# Patient Record
Sex: Male | Born: 1960
Health system: Southern US, Community
[De-identification: ages and names within clinical notes are randomized; demographics above are authoritative.]

## PROBLEM LIST (undated history)

## (undated) DIAGNOSIS — I639 Cerebral infarction, unspecified: Secondary | ICD-10-CM

## (undated) DIAGNOSIS — K219 Gastro-esophageal reflux disease without esophagitis: Secondary | ICD-10-CM

## (undated) DIAGNOSIS — I7101 Dissection of thoracic aorta: Secondary | ICD-10-CM

## (undated) DIAGNOSIS — J302 Other seasonal allergic rhinitis: Secondary | ICD-10-CM

## (undated) DIAGNOSIS — J449 Chronic obstructive pulmonary disease, unspecified: Secondary | ICD-10-CM

## (undated) DIAGNOSIS — I5042 Chronic combined systolic (congestive) and diastolic (congestive) heart failure: Secondary | ICD-10-CM

## (undated) DIAGNOSIS — I2119 ST elevation (STEMI) myocardial infarction involving other coronary artery of inferior wall: Secondary | ICD-10-CM

## (undated) DIAGNOSIS — I10 Essential (primary) hypertension: Secondary | ICD-10-CM

## (undated) DIAGNOSIS — F419 Anxiety disorder, unspecified: Secondary | ICD-10-CM

## (undated) DIAGNOSIS — I255 Ischemic cardiomyopathy: Secondary | ICD-10-CM

## (undated) DIAGNOSIS — E785 Hyperlipidemia, unspecified: Secondary | ICD-10-CM

## (undated) HISTORY — DX: Ischemic cardiomyopathy: I25.5

## (undated) HISTORY — DX: Chronic obstructive pulmonary disease, unspecified: J44.9

## (undated) HISTORY — DX: ST elevation (STEMI) myocardial infarction involving other coronary artery of inferior wall: I21.19

## (undated) HISTORY — DX: Gastro-esophageal reflux disease without esophagitis: K21.9

## (undated) HISTORY — DX: Hyperlipidemia, unspecified: E78.5

## (undated) HISTORY — DX: Anxiety disorder, unspecified: F41.9

## (undated) HISTORY — PX: OTHER SURGICAL HISTORY: SHX169

## (undated) HISTORY — DX: Dissection of thoracic aorta: I71.01

## (undated) HISTORY — DX: Cerebral infarction, unspecified: I63.9

## (undated) HISTORY — DX: Chronic combined systolic (congestive) and diastolic (congestive) heart failure: I50.42

## (undated) HISTORY — DX: Other seasonal allergic rhinitis: J30.2

---

## 1998-10-07 ENCOUNTER — Emergency Department (HOSPITAL_COMMUNITY): Admission: EM | Admit: 1998-10-07 | Discharge: 1998-10-07 | Payer: Self-pay | Admitting: Emergency Medicine

## 1999-01-05 ENCOUNTER — Encounter: Payer: Self-pay | Admitting: Emergency Medicine

## 1999-01-05 ENCOUNTER — Emergency Department (HOSPITAL_COMMUNITY): Admission: EM | Admit: 1999-01-05 | Discharge: 1999-01-05 | Payer: Self-pay | Admitting: Emergency Medicine

## 1999-02-09 ENCOUNTER — Ambulatory Visit (HOSPITAL_COMMUNITY): Admission: RE | Admit: 1999-02-09 | Discharge: 1999-02-09 | Payer: Self-pay | Admitting: Gastroenterology

## 1999-02-09 ENCOUNTER — Encounter: Payer: Self-pay | Admitting: Gastroenterology

## 2009-04-12 ENCOUNTER — Emergency Department (HOSPITAL_COMMUNITY): Admission: EM | Admit: 2009-04-12 | Discharge: 2009-04-12 | Payer: Self-pay | Admitting: Emergency Medicine

## 2010-12-18 LAB — POCT I-STAT, CHEM 8
Calcium, Ion: 1.15 mmol/L (ref 1.12–1.32)
Glucose, Bld: 103 mg/dL — ABNORMAL HIGH (ref 70–99)
HCT: 46 % (ref 39.0–52.0)
Hemoglobin: 15.6 g/dL (ref 13.0–17.0)

## 2010-12-18 LAB — URINALYSIS, ROUTINE W REFLEX MICROSCOPIC
Bilirubin Urine: NEGATIVE
Glucose, UA: NEGATIVE mg/dL
Ketones, ur: NEGATIVE mg/dL
Leukocytes, UA: NEGATIVE
Nitrite: NEGATIVE
Protein, ur: NEGATIVE mg/dL
Specific Gravity, Urine: 1.019 (ref 1.005–1.030)
Urobilinogen, UA: 0.2 mg/dL (ref 0.0–1.0)
pH: 5.5 (ref 5.0–8.0)

## 2010-12-18 LAB — URINE MICROSCOPIC-ADD ON

## 2014-12-24 ENCOUNTER — Emergency Department (HOSPITAL_COMMUNITY)
Admission: EM | Admit: 2014-12-24 | Discharge: 2014-12-24 | Disposition: A | Discharge status: Home / Self Care | Payer: Self-pay | Attending: Emergency Medicine | Admitting: Emergency Medicine

## 2014-12-24 ENCOUNTER — Encounter (HOSPITAL_COMMUNITY): Payer: Self-pay | Admitting: Emergency Medicine

## 2014-12-24 ENCOUNTER — Emergency Department (HOSPITAL_COMMUNITY): Payer: Self-pay

## 2014-12-24 DIAGNOSIS — M25559 Pain in unspecified hip: Secondary | ICD-10-CM

## 2014-12-24 DIAGNOSIS — Z720 Tobacco use: Secondary | ICD-10-CM | POA: Insufficient documentation

## 2014-12-24 DIAGNOSIS — G8929 Other chronic pain: Secondary | ICD-10-CM | POA: Insufficient documentation

## 2014-12-24 DIAGNOSIS — L03115 Cellulitis of right lower limb: Secondary | ICD-10-CM | POA: Insufficient documentation

## 2014-12-24 DIAGNOSIS — M25551 Pain in right hip: Secondary | ICD-10-CM | POA: Insufficient documentation

## 2014-12-24 LAB — CBG MONITORING, ED: Glucose-Capillary: 102 mg/dL — ABNORMAL HIGH (ref 70–99)

## 2014-12-24 MED ORDER — HYDROCODONE-ACETAMINOPHEN 5-325 MG PO TABS: 1.0000 | ORAL_TABLET | Freq: Four times a day (QID) | ORAL | Status: DC | PRN

## 2014-12-24 MED ORDER — SULFAMETHOXAZOLE-TRIMETHOPRIM 800-160 MG PO TABS: 1.0000 | ORAL_TABLET | Freq: Two times a day (BID) | ORAL | Status: AC

## 2014-12-24 MED ORDER — CEPHALEXIN 500 MG PO CAPS: 500.0000 mg | ORAL_CAPSULE | Freq: Four times a day (QID) | ORAL | Status: DC

## 2014-12-24 NOTE — ED Provider Notes (Signed)
CSN: [id number]     Arrival date & time 12/24/14  1028 History  This chart was scribed for non-physician practitioner, Frank Mutton, [name], working with Frank Canal, [name], by [name], [name]. This patient was seen in room TR09C/TR09C and the patient's care was started at 10:57 AM.    Chief Complaint  Patient presents with  . Foot Pain  . Hip Pain    The history is provided by the patient. No language interpreter was used.   HPI Comments: [name] is a 54 y.o. male who with no known signficant past medical history presenting to the [facility] with right hip pain and right foot pain. Patient reported that he's been dealing with the right hip pain for proximally 6 months, but states it is gone progressively worse. When asked the patient is followed by anyone regarding this pain, patient denies. Stated the pain comes and goes describes a sharp shooting pain that radiates down the back of his thigh. Stated he's been using nothing for the discomfort. 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 Patient reported that this morning he woke up with right foot pain-reported that he noticed swelling, redness and warmth upon palpation to the top of his right foot. Stated that at rest the pain is an underlying throbbing discomfort, but states with motion there is a pressure sensation. Patient reported that he thinks he got bit by something. Stated that the pain stays within his right foot. Not history of gout or diabetes. Denied itching, drainage, injury, trauma, fall, knee pain, numbness, loss of sensation, leg swelling, travels, back pain, neck pain, urinary and bowel incontinence, Donald pain, nausea, vomiting. PCP none  History reviewed. No pertinent past medical history. Past Surgical History  Procedure Laterality Date  . Rectal abscess     No family history on file. History  Substance Use Topics  . Smoking status: Current Some Day Smoker     Types: Cigarettes  . Smokeless tobacco: Not on file  . Alcohol Use: Yes     Comment: occasionally    Review of Systems  Constitutional: Negative for fever.  Cardiovascular: Negative for leg swelling.  Gastrointestinal: Negative for nausea, vomiting and diarrhea.  Musculoskeletal: Positive for arthralgias (right hip, right foot). Negative for back pain and neck pain.  Skin: Positive for color change (redness).  Neurological: Negative for weakness and numbness.      Allergies  Review of patient's allergies indicates no known allergies.  Home Medications   Prior to Admission medications   Medication Sig Start Date End Date Taking? Authorizing Provider  cephALEXin (KEFLEX) 500 MG capsule Take 1 capsule (500 mg total) by mouth 4 (four) times daily. 12/24/14   Frank Siemon, [name]  HYDROcodone-acetaminophen (NORCO/VICODIN) 5-325 MG per tablet Take 1 tablet by mouth every 6 (six) hours as needed for severe pain. 12/24/14   Frank Grisby, [name]  sulfamethoxazole-trimethoprim (BACTRIM DS,SEPTRA DS) 800-160 MG per tablet Take 1 tablet by mouth 2 (two) times daily. 12/24/14 12/31/14  Frank Michelle, [name]   BP 152/101 mmHg  Pulse 90  Temp(Src) 98.5 F (36.9 C) (Oral)  Resp 18  Ht  (1.702 m)  Wt 250 lb (113.399 kg)  BMI 39.15 kg/m2  SpO2 100% Physical Exam  Constitutional: He is oriented to person, place, and time. He appears well-developed and well-nourished. No distress.  HENT:  Head: Normocephalic and atraumatic.  Eyes: Conjunctivae and EOM are normal. Right eye exhibits no  discharge. Left eye exhibits no discharge.  Neck: Normal range of motion. Neck supple.  Cardiovascular: Normal rate, regular rhythm and normal heart sounds.  Exam reveals no friction rub.   No murmur heard. Pulses:      Radial pulses are 2+ on the right side, and 2+ on the left side.       Dorsalis pedis pulses are 2+ on the right side, and 2+ on the left side.  Pulmonary/Chest: Effort normal and breath  sounds normal. No respiratory distress. He has no wheezes. He has no rales.  Musculoskeletal: Normal range of motion. He exhibits tenderness. He exhibits no edema.       Right hip: He exhibits tenderness. He exhibits normal range of motion, normal strength, no bony tenderness, no swelling, no crepitus and no deformity.       Right ankle: He exhibits swelling. He exhibits normal range of motion, no ecchymosis, no deformity and no laceration. Tenderness (dorsal aspect ).       Legs:      Feet:  Negative swelling, erythema, formation, lesions, sores, deformities identified to the right hip. Full range of motion identified. Mild discomfort upon palpation to the right acetabulum.  Neurological: He is alert and oriented to person, place, and time. No cranial nerve deficit. He exhibits normal muscle tone. Coordination normal.  Cranial nerves III-XII grossly intact Strength 5+/5+ to lower extremities bilaterally with resistance applied, equal distribution noted Sensation intact with differentiation to sharp and dull touch to lower extremity, right Gait proper, proper balance - negative sway, negative drift, negative step-offs  Skin: Skin is warm and dry. No rash noted. He is not diaphoretic. There is erythema.  Approximate 5 cm x 5 cm circular area of erythema and swelling identified to the dorsal aspect of the right foot with tenderness upon palpation. Negative area of fluctuance or induration noted. Negative active drainage or bleeding noted. Negative red streaks. Doubt herpes zoster. Suspicion to be beginnings of cellulitis.  Psychiatric: He has a normal mood and affect. His behavior is normal. Thought content normal.  Nursing note and vitals reviewed.   [facility] Course  Procedures (including critical care time) DIAGNOSTIC STUDIES: Oxygen Saturation is 99% on room air, normal by my interpretation.    COORDINATION OF CARE: 11:06 AM Discussed treatment plan with patient at beside, the patient agrees with  the plan and has no further questions at this time.   Labs Review Labs Reviewed  CBG MONITORING, [facility] - Abnormal; Notable for the following:    Glucose-Capillary 102 (*)    All other components within normal limits    Imaging Review Dg Foot Complete Right  12/24/2014   CLINICAL DATA:  Pain and redness across metatarsals. No history of recent trauma  EXAM: RIGHT FOOT COMPLETE - 3+ VIEW  COMPARISON:  None.  FINDINGS: Frontal, oblique, and lateral views obtained. No fracture or dislocation. Joint spaces appear intact. No erosive change.  IMPRESSION: No fracture or dislocation.  No appreciable arthropathic change.   Electronically Signed   By: Bretta Bang III M.D.   On: 12/24/2014 12:08   Dg Hip Unilat With Pelvis 2-3 Views Right  12/24/2014   CLINICAL DATA:  Worsening Pain right hip for 6 months.no injury POSTERIOR AND LATERAL RIGHT HIP PAIN  EXAM: RIGHT HIP (WITH PELVIS) 2-3 VIEWS  COMPARISON:  None.  FINDINGS: There are advanced arthropathic changes of the right hip with marked superior lateral joint space narrowing prominent superior acetabular and right femoral head subchondral cystic change  and sclerosis as well as osteophytes from the base of the right femoral head and from the lateral margin of the superior acetabulum. There is no acute fracture.  Left hip joint is normally spaced and aligned with no significant arthropathic change. SI joints and symphysis pubis are normally spaced and aligned.  Soft tissues are unremarkable.  IMPRESSION: 1. No acute fracture or dislocation. 2. Advanced arthropathic changes of the right hip, not evident on the left. Findings are consistent with osteoarthritis, which may be secondary to remote trauma or chronic avascular necrosis.   Electronically Signed   By: Amie Portlandavid  Ormond M.D.   On: 12/24/2014 11:55     EKG Interpretation None      12:27 PM Discussed findings with Dr. Cheri Rous. Yao, agreed to discharge patient.   MDM   Final diagnoses:  Hip pain   Chronic hip pain, right  Cellulitis of right lower extremity    Medications - No data to display  Filed Vitals:   12/24/14 1041 12/24/14 1228  BP: 150/98 152/101  Pulse: 101 90  Temp: 98.5 F (36.9 C)   TempSrc: Oral   Resp: 18 18  Height: 5\' 7"  (1.702 m)   Weight: 250 lb (113.399 kg)   SpO2: 99% 100%   I personally performed the services described in this documentation, which was scribed in my presence. The recorded information has been reviewed and is accurate.  CBG 102. Unilateral plain film of right hip no acute fracture dislocation-advanced arthropathic changes of the right hip consistent with osteoarthritis which may be secondary to remote trauma or chronic avascular necrosis. Plain film of right foot no fracture dislocation. No appreciable arthropathic changes noted. Doubt diabetes. Suspicion of swelling to the right foot identified to be cellulitic infection, doubt gout. Right hip pain has been chronic for approximately 6 months, most likely to be osteoarthritis. Negative focal neurological deficits noted. Pulses palpable and strong. Cap refill less than 3 seconds. Patient stable, afebrile. Patient not septic appearing. Discharged patient. Discharge patient with antibiotics for cellulitis. Discharge patient with small dose of pain medications-discussed course, cautions, disposal technique. Line of demarcation made on foot-discussed with patient that his swelling or redness goes past the line he needs to report back to the [facility] immediately. [facility]. Discussed with patient to closely monitor symptoms and if symptoms are to worsen or change to report back to the [facility] - strict return instructions given.  Patient agreed to plan of care, understood, all questions answered.   Frank MuttonMarissa Tykesha Konicki, [name] 12/24/14 1238  Frank Canalavid H Yao, [name] 12/24/14 [phone]

## 2014-12-24 NOTE — ED Notes (Signed)
C/o right foot pain since last pm. Dorsal surface of foot red and swollen. ALSO, c/o right hip pain x 6 months. No PCP.

## 2014-12-24 NOTE — Discharge Instructions (Signed)
Please call your doctor for a followup appointment within 24-48 hours. When you talk to your doctor please let them know that you were seen in the emergency department and have them acquire all of your records so that they can discuss the findings with you and formulate a treatment plan to fully care for your new and ongoing problems. Please follow-up with health and wellness Center Please rest and stay hydrated Please rest, ice, elevate the right hip Please follow-up with orthopedics regarding chronic right hip pain Please avoid any physical strenuous activity Please take antibiotics as prescribed on a full stomach Please report back to the ED within 48 hours for site to be reassessed Please take medications as prescribed - while on pain medications there is to be no drinking alcohol, driving, operating any heavy machinery. If extra please dispose in a proper manner. Please do not take any extra Tylenol with this medication for this can lead to Tylenol overdose and liver issues.  Please continue to monitor symptoms closely and if symptoms are to worsen or change (fever greater than 101, chills, sweating, nausea, vomiting, chest pain, shortness of breathe, difficulty breathing, weakness, numbness, tingling, worsening or changes to pain pattern, fall, injury, loss of sensation, spreading of the redness, red streaks running up the leg, leg swelling) please report back to the Emergency Department immediately.   Hip Pain Your hip is the joint between your upper legs and your lower pelvis. The bones, cartilage, tendons, and muscles of your hip joint perform a lot of work each day supporting your body weight and allowing you to move around. Hip pain can range from a minor ache to severe pain in one or both of your hips. Pain may be felt on the inside of the hip joint near the groin, or the outside near the buttocks and upper thigh. You may have swelling or stiffness as well.  HOME CARE INSTRUCTIONS   Take  medicines only as directed by your health care provider.  Apply ice to the injured area:  Put ice in a plastic bag.  Place a towel between your skin and the bag.  Leave the ice on for 15-20 minutes at a time, 3-4 times a day.  Keep your leg raised (elevated) when possible to lessen swelling.  Avoid activities that cause pain.  Follow specific exercises as directed by your health care provider.  Sleep with a pillow between your legs on your most comfortable side.  Record how often you have hip pain, the location of the pain, and what it feels like. SEEK MEDICAL CARE IF:   You are unable to put weight on your leg.  Your hip is red or swollen or very tender to touch.  Your pain or swelling continues or worsens after 1 week.  You have increasing difficulty walking.  You have a fever. SEEK IMMEDIATE MEDICAL CARE IF:   You have fallen.  You have a sudden increase in pain and swelling in your hip. MAKE SURE YOU:   Understand these instructions.  Will watch your condition.  Will get help right away if you are not doing well or get worse. Document Released: 02/15/2010 Document Revised: 01/12/2014 Document Reviewed: 04/24/2013 [facility] Patient Information 2015 [place]. This information is not intended to replace advice given to you by your health care provider. Make sure you discuss any questions you have with your health care provider.  Cellulitis Cellulitis is an infection of the skin and the tissue beneath it. The infected area  is usually red and tender. Cellulitis occurs most often in the arms and lower legs.  CAUSES  Cellulitis is caused by bacteria that enter the skin through cracks or cuts in the skin. The most common types of bacteria that cause cellulitis are staphylococci and streptococci. SIGNS AND SYMPTOMS   Redness and warmth.  Swelling.  Tenderness or pain.  Fever. DIAGNOSIS  Your health care provider can usually determine what is wrong based on  a physical exam. Blood tests may also be done. TREATMENT  Treatment usually involves taking an antibiotic medicine. HOME CARE INSTRUCTIONS   Take your antibiotic medicine as directed by your health care provider. Finish the antibiotic even if you start to feel better.  Keep the infected arm or leg elevated to reduce swelling.  Apply a warm cloth to the affected area up to 4 times per day to relieve pain.  Take medicines only as directed by your health care provider.  Keep all follow-up visits as directed by your health care provider. SEEK MEDICAL CARE IF:   You notice red streaks coming from the infected area.  Your red area gets larger or turns dark in color.  Your bone or joint underneath the infected area becomes painful after the skin has healed.  Your infection returns in the same area or another area.  You notice a swollen bump in the infected area.  You develop new symptoms.  You have a fever. SEEK IMMEDIATE MEDICAL CARE IF:   You feel very sleepy.  You develop vomiting or diarrhea.  You have a general ill feeling (malaise) with muscle aches and pains. MAKE SURE YOU:   Understand these instructions.  Will watch your condition.  Will get help right away if you are not doing well or get worse. Document Released: 06/07/2005 Document Revised: 01/12/2014 Document Reviewed: 11/13/2011 [facility] Patient Information 2015 [place], [place]. This information is not intended to replace advice given to you by your health care provider. Make sure you discuss any questions you have with your health care provider.   Emergency Department Resource Guide 1) Find a Doctor and Pay Out of Pocket Although you won't have to find out who is covered by your insurance plan, it is a good idea to ask around and get recommendations. You will then need to call the office and see if the doctor you have chosen will accept you as a new patient and what types of options they offer for patients who  are self-pay. Some doctors offer discounts or will set up payment plans for their patients who do not have insurance, but you will need to ask so you aren't surprised when you get to your appointment.  2) Contact Your Local Health Department Not all health departments have doctors that can see patients for sick visits, but many do, so it is worth a call to see if yours does. If you don't know where your local health department is, you can check in your phone book. The CDC also has a tool to help you locate your state's health department, and many state websites also have listings of all of their local health departments.  3) Find a Walk-in Clinic If your illness is not likely to be very severe or complicated, you may want to try a walk in clinic. These are popping up all over the country in pharmacies, drugstores, and shopping centers. They're usually staffed by nurse practitioners or physician assistants that have been trained to treat common illnesses and complaints. They're usually  fairly quick and inexpensive. However, if you have serious medical issues or chronic medical problems, these are probably not your best option.  No Primary Care Doctor: - Call Health Connect at  [phone] - they can help you locate a primary care doctor that  accepts your insurance, provides certain services, etc. - Physician Referral Service- [phone]  Chronic Pain Problems: Organization         Address  Phone   Notes  [facility]  [phone] Patients need to be referred by their primary care doctor.   Medication Assistance: Organization         Address  Phone   Notes  [facility] Medication [facility] [address] [place], [place] [address] [phone] --Must be a resident of [facility] -- Must have NO insurance coverage whatsoever (no Medicaid/ Medicare, etc.) -- The pt. MUST have a primary care doctor that directs their care regularly and follows  them in the community   Med[facility]ist  [phone]   [employer]  [phone]    Agencies that provide inexpensive medical care: Organization         Address  Phone   Notes  [facility]  [phone]   [facility]    [phone]   [facility] [address] [place], [place] [address] [phone]   [facility] [address], [place] [phone]   Planned Parenthood    [phone]   [facility]    [phone]   [facility]  [address], [place] Phone:  [phone], Fax:  [phone] Hours of Operation:  9 am - 6 pm, M-F.  Also accepts Medicaid/Medicare and self-pay.  [facility]  [address], [place] Phone: [phone], Fax: [phone]. Hours of Operation:  8:30 am - 5:30 pm, M-F.  Also accepts Medicaid and self-pay.  [facility] [place] [address] [place] Phone: [phone]   [facility] [address] Natasha Bence [place], [place] [phone], Ext. [phone] Mondays & Thursdays: 7-9 AM.  First 15 patients are seen on a first come, first serve basis.    Medicaid-accepting [facility] Providers:  Organization         Address  Phone   Notes  [facility] [address], [place] [phone] Also accepts self-pay patients.  [facility] [address] Laurell Josephs [place], [place]  [phone]   [facility] [address], [place] [phone]   [facility] [address] [place] [phone]   Renaye Rakers [address], [place]   [phone] Only accepts [place] patients after they have their name applied to their card.   Self-Pay (no insurance) in [facility]:  Organization         Address  Phone   Notes  Sickle Cell  Patients, [facility] [address] [place], [place] [phone]   [facility] [address] [place], [place] [phone]   [facility] [place]  [address], [place] [phone]   [facility]/Dr. Osei-Bonsu  [address] [place] or [address], [place] [phone] Phone  number for both [place] and [place] locations is the same.  [facility] [address], [place] [phone]   [facility] [address], [place] or [address] [phone]   [facility] [address], [place] [phone], phone; [phone], fax Sees patients 1st and 3rd Saturday of every month.  Must not qualify for public or private insurance (i.e. Medicaid, Medicare, [place] Health Choice, Veterans' Benefits)  Household income should be no more than 200% of the poverty level The clinic cannot treat you if you are pregnant or think you are pregnant  Sexually transmitted diseases are not treated at the clinic.    Dental Care: Organization         Address  Phone  Notes  [facility] of [facility] [address] [place], [place] [phone] Accepts children up to age 45 who are enrolled in [place] or [place] Health Choice; pregnant women with a Medicaid card; and children who have applied for Medicaid or [place] Health Choice, but were declined, whose parents can pay a reduced fee at time of service.  [facility]  [address], [place] [phone] Accepts children up to age 65 who are enrolled in [place] or [place] Health Choice; pregnant women with a Medicaid card; and children who have applied for Medicaid or [place] Health Choice, but were declined, whose parents can pay a reduced fee at time of service.  [facility]  [address] [place], [place] [phone] Patients are seen by appointment only. Walk-ins are not accepted. [facility] will see patients 15 years of age and older. Monday - Tuesday (8am-5pm) Most Wednesdays (8:30-5pm) $30 per visit, cash only  [facility] Adult Dental Access PROGRAM  [address], [facility] [phone] Patients are seen by appointment only. Walk-ins are not accepted. [facility] will see patients 71 years of age and older. One Wednesday Evening (Monthly: Volunteer Based).  $30 per visit, cash only  [employer]  [phone] for adults; Children under age 95, call Graduate Pediatric Dentistry at [phone]. Children aged 18-14, please call [phone] to request a pediatric application.  Dental services are provided in all areas of dental care including fillings, crowns and bridges, complete and partial dentures, implants, gum treatment, root canals, and extractions. Preventive care is also provided. Treatment is provided to both adults and children. Patients are selected via a lottery and there is often a waiting list.   [facility] [address] [place]  [phone] [email or web address]   [facility] [address] [place], [place] [phone], Ext. [phone] Second and Fourth Thursday of each month, opens at 6:30 AM; Clinic ends at 9 AM.  Patients are seen on a first-come first-served basis, and a limited number are seen during each clinic.   [facility]  [address] Ether Griffins [place], [place] [phone]   Eligibility Requirements You must have lived in [place], [place], or [place] counties for at least the last three months.   You cannot be eligible for state or federal sponsored [place], including [employer], [place], or [employer].   You generally cannot be eligible for healthcare insurance through your employer.    How to apply: Eligibility  screenings are held every Tuesday and Wednesday afternoon from 1:00 pm  until 4:00 pm. You do not need an appointment for the interview!  [facility] [address], [place], [place] [phone]   [facility] Health Department  [phone]   [facility] Health Department  [phone]   [facility] Health Department  [phone]    Behavioral Health Resources in the Community: Intensive Outpatient Programs Organization         Address  Phone  Notes  [facility] [address], [place], [place] [phone]   [facility] [address] [place], [place] [phone]   [facility] [address], [place], [place]  [phone]   [facility] [address],  [place], [place] [phone] or [phone]   Substance Abuse Resources Organization         Address  Phone  Notes  [facility]  [phone]   [facility]  [phone]   [facility] [place]  [phone]   Floydene Flock  [phone]   Residential & Outpatient Substance Abuse Program  [phone]   Psychological Services Organization         Address  Phone  Notes  [facility] Behavioral Health  [phone]   [facility] Services  [phone]   [facility] [address], [place] [phone] or [phone]    Mobile Crisis Teams Organization         Address  Phone  Notes  [facility], Mobile Crisis Care Unit  [phone]   [facility]  [address] [place], [place] [phone]   Doristine Locks [address] [place] [phone]    Self-Help/Support Groups Organization         Address  Phone             Notes  [facility] [place] - variety of support groups  336- [phone] Call for more information  Narcotics Anonymous (NA), [facility] [address], [employer] [place]  2 meetings at  this location   Statistician         Address  Phone  Notes  [facility] [address],    [place]  [phone]   [facility]  [address] [place] [address], [place], [place] [phone]   [facility] [address] [place], [place] [phone] Admissions: 8am-3pm M-F  [facility] [address]    [place], [place] [phone]   [facility] [address] [place], [place], [place] [phone]   [facility] [address],  [place], [place] [phone]   [facility] - Intensive Outpatient [address] Laurell Josephs [address], [place], [place] [phone]   [facility] ([facility] [address] [place]  [place], [place] [phone] or [phone]   [facility] ([facility]) [address], [place], [place] [phone] Accepts Medicaid  [facility] [place] [address]  [place] [phone] Substance Abuse/Addiction Treatment   [facility] Organization         Address  Phone  Notes  [facility]  [phone]   Angie Fava, PhD [address] Ervin Knack [place], [place]   [phone] or [phone]   [facility] Behavioral   [address] [place], [place] [phone]   [facility] [address] [place], [place] [phone] Insurance/Medicaid/sponsorship through [employer] and Families [address], [place] [phone] Therapy/tele-psych/case  [facility] [address]   [place], [place] [phone]    Dr. Lolly Mustache  [phone]   [facility] [place]  [employer] Dept. 1) [address] [place] 2) [address] [place] 3)  [address], [place] [phone]  [phone]   [facility] Child Abuse Hotline [phone] or [phone] (After Hours)

## 2015-01-02 ENCOUNTER — Emergency Department (HOSPITAL_COMMUNITY): Payer: Self-pay

## 2015-01-02 ENCOUNTER — Inpatient Hospital Stay (HOSPITAL_COMMUNITY)
Admission: EM | Admit: 2015-01-02 | Discharge: 2015-01-07 | DRG: 299 | Disposition: A | Discharge status: Home / Self Care | Payer: Self-pay | Attending: Internal Medicine | Admitting: Internal Medicine

## 2015-01-02 ENCOUNTER — Encounter (HOSPITAL_COMMUNITY): Payer: Self-pay | Admitting: Emergency Medicine

## 2015-01-02 DIAGNOSIS — Z720 Tobacco use: Secondary | ICD-10-CM | POA: Diagnosis present

## 2015-01-02 DIAGNOSIS — K852 Alcohol induced acute pancreatitis without necrosis or infection: Secondary | ICD-10-CM | POA: Diagnosis present

## 2015-01-02 DIAGNOSIS — F1010 Alcohol abuse, uncomplicated: Secondary | ICD-10-CM | POA: Diagnosis present

## 2015-01-02 DIAGNOSIS — I7102 Dissection of abdominal aorta: Principal | ICD-10-CM | POA: Diagnosis present

## 2015-01-02 DIAGNOSIS — I7101 Dissection of thoracic aorta: Secondary | ICD-10-CM

## 2015-01-02 DIAGNOSIS — K859 Acute pancreatitis without necrosis or infection, unspecified: Secondary | ICD-10-CM | POA: Diagnosis present

## 2015-01-02 DIAGNOSIS — F1410 Cocaine abuse, uncomplicated: Secondary | ICD-10-CM | POA: Diagnosis present

## 2015-01-02 DIAGNOSIS — I71019 Dissection of thoracic aorta, unspecified: Secondary | ICD-10-CM | POA: Diagnosis present

## 2015-01-02 DIAGNOSIS — E669 Obesity, unspecified: Secondary | ICD-10-CM | POA: Diagnosis present

## 2015-01-02 DIAGNOSIS — Z833 Family history of diabetes mellitus: Secondary | ICD-10-CM

## 2015-01-02 DIAGNOSIS — K858 Other acute pancreatitis: Secondary | ICD-10-CM

## 2015-01-02 DIAGNOSIS — R0789 Other chest pain: Secondary | ICD-10-CM

## 2015-01-02 DIAGNOSIS — I714 Abdominal aortic aneurysm, without rupture: Secondary | ICD-10-CM | POA: Diagnosis present

## 2015-01-02 DIAGNOSIS — R9431 Abnormal electrocardiogram [ECG] [EKG]: Secondary | ICD-10-CM

## 2015-01-02 DIAGNOSIS — F17210 Nicotine dependence, cigarettes, uncomplicated: Secondary | ICD-10-CM | POA: Diagnosis present

## 2015-01-02 DIAGNOSIS — R739 Hyperglycemia, unspecified: Secondary | ICD-10-CM | POA: Diagnosis present

## 2015-01-02 DIAGNOSIS — I10 Essential (primary) hypertension: Secondary | ICD-10-CM | POA: Diagnosis present

## 2015-01-02 DIAGNOSIS — R1013 Epigastric pain: Secondary | ICD-10-CM

## 2015-01-02 DIAGNOSIS — T405X1A Poisoning by cocaine, accidental (unintentional), initial encounter: Secondary | ICD-10-CM | POA: Diagnosis present

## 2015-01-02 DIAGNOSIS — Z6839 Body mass index (BMI) 39.0-39.9, adult: Secondary | ICD-10-CM

## 2015-01-02 DIAGNOSIS — I7100 Dissection of unspecified site of aorta: Secondary | ICD-10-CM | POA: Diagnosis present

## 2015-01-02 HISTORY — DX: Essential (primary) hypertension: I10

## 2015-01-02 LAB — TROPONIN I
Troponin I: <0.03 ng/mL (ref <0.031)
Troponin I: <0.03 ng/mL (ref <0.031)

## 2015-01-02 LAB — COMPREHENSIVE METABOLIC PANEL
ALBUMIN: 3.8 g/dL (ref 3.5–5.2)
ALT: 18 U/L (ref 0–53)
ANION GAP: 10 (ref 5–15)
AST: 25 U/L (ref 0–37)
Alkaline Phosphatase: 101 U/L (ref 39–117)
BILIRUBIN TOTAL: 1 mg/dL (ref 0.3–1.2)
BUN: 12 mg/dL (ref 6–23)
CHLORIDE: 102 mmol/L (ref 96–112)
CO2: 23 mmol/L (ref 19–32)
CREATININE: 1.02 mg/dL (ref 0.50–1.35)
Calcium: 9.1 mg/dL (ref 8.4–10.5)
GFR calc Af Amer: >90 mL/min (ref >90)
GFR calc non Af Amer: 82 mL/min — ABNORMAL LOW (ref >90)
Glucose, Bld: 100 mg/dL — ABNORMAL HIGH (ref 70–99)
Potassium: 4.4 mmol/L (ref 3.5–5.1)
Sodium: 135 mmol/L (ref 135–145)
TOTAL PROTEIN: 7 g/dL (ref 6.0–8.3)

## 2015-01-02 LAB — CBC WITH DIFFERENTIAL/PLATELET
BASOS PCT: 0 % (ref 0–1)
Basophils Absolute: 0.1 10*3/uL (ref 0.0–0.1)
EOS ABS: 0.1 10*3/uL (ref 0.0–0.7)
Eosinophils Relative: 1 % (ref 0–5)
HCT: 47.3 % (ref 39.0–52.0)
HEMOGLOBIN: 15.7 g/dL (ref 13.0–17.0)
Lymphocytes Relative: 24 % (ref 12–46)
Lymphs Abs: 2.7 10*3/uL (ref 0.7–4.0)
MCH: 26.5 pg (ref 26.0–34.0)
MCHC: 33.2 g/dL (ref 30.0–36.0)
MCV: 79.8 fL (ref 78.0–100.0)
MONO ABS: 0.9 10*3/uL (ref 0.1–1.0)
MONOS PCT: 8 % (ref 3–12)
NEUTROS PCT: 67 % (ref 43–77)
Neutro Abs: 7.4 10*3/uL (ref 1.7–7.7)
Platelets: 235 10*3/uL (ref 150–400)
RBC: 5.93 MIL/uL — ABNORMAL HIGH (ref 4.22–5.81)
RDW: 14.3 % (ref 11.5–15.5)
WBC: 11.1 10*3/uL — ABNORMAL HIGH (ref 4.0–10.5)

## 2015-01-02 LAB — AMYLASE: AMYLASE: 206 U/L — AB (ref 0–105)

## 2015-01-02 LAB — RAPID URINE DRUG SCREEN, HOSP PERFORMED
AMPHETAMINES: NOT DETECTED
BARBITURATES: NOT DETECTED
Benzodiazepines: NOT DETECTED
Cocaine: POSITIVE — AB
Opiates: POSITIVE — AB
Tetrahydrocannabinol: NOT DETECTED

## 2015-01-02 LAB — MRSA PCR SCREENING: MRSA by PCR: NEGATIVE

## 2015-01-02 LAB — LACTIC ACID, PLASMA: LACTIC ACID, VENOUS: 2.2 mmol/L — AB (ref 0.5–2.0)

## 2015-01-02 LAB — LIPASE, BLOOD: Lipase: 776 U/L — ABNORMAL HIGH (ref 11–59)

## 2015-01-02 LAB — PROCALCITONIN: Procalcitonin: <0.1 ng/mL

## 2015-01-02 MED ORDER — ONDANSETRON HCL 4 MG/2ML IJ SOLN
4.0000 mg | Freq: Once | INTRAMUSCULAR | Status: AC
  Administered 2015-01-02: 4 mg via INTRAVENOUS
  Filled 2015-01-02: qty 2

## 2015-01-02 MED ORDER — ESMOLOL HCL-SODIUM CHLORIDE 2000 MG/100ML IV SOLN
25.0000 ug/kg/min | INTRAVENOUS | Status: DC
  Administered 2015-01-02 (×4): 175 ug/kg/min via INTRAVENOUS
  Administered 2015-01-03: 75 ug/kg/min via INTRAVENOUS
  Administered 2015-01-03: 25 ug/kg/min via INTRAVENOUS
  Filled 2015-01-02 (×6): qty 100

## 2015-01-02 MED ORDER — HEPARIN SODIUM (PORCINE) 5000 UNIT/ML IJ SOLN: 5000.0000 [IU] | Freq: Three times a day (TID) | INTRAMUSCULAR | Status: DC

## 2015-01-02 MED ORDER — ESMOLOL HCL-SODIUM CHLORIDE 2000 MG/100ML IV SOLN
25.0000 ug/kg/min | Freq: Once | INTRAVENOUS | Status: AC
  Administered 2015-01-02: 25 ug/kg/min via INTRAVENOUS
  Filled 2015-01-02 (×2): qty 100

## 2015-01-02 MED ORDER — NICARDIPINE HCL IN NACL 20-0.86 MG/200ML-% IV SOLN
3.0000 mg/h | INTRAVENOUS | Status: DC
  Administered 2015-01-02: 5 mg/h via INTRAVENOUS
  Administered 2015-01-03: 12 mg/h via INTRAVENOUS
  Administered 2015-01-03: 15 mg/h via INTRAVENOUS
  Administered 2015-01-03: 10 mg/h via INTRAVENOUS
  Administered 2015-01-03: 15 mg/h via INTRAVENOUS
  Administered 2015-01-03: 12 mg/h via INTRAVENOUS
  Administered 2015-01-03: 10 mg/h via INTRAVENOUS
  Administered 2015-01-03: 15 mg/h via INTRAVENOUS
  Administered 2015-01-03: 10 mg/h via INTRAVENOUS
  Administered 2015-01-03: 15 mg/h via INTRAVENOUS
  Administered 2015-01-03: 12 mg/h via INTRAVENOUS
  Administered 2015-01-03: 15 mg/h via INTRAVENOUS
  Administered 2015-01-03 – 2015-01-04 (×2): 12 mg/h via INTRAVENOUS
  Administered 2015-01-04: 15 mg/h via INTRAVENOUS
  Administered 2015-01-04 (×4): 12 mg/h via INTRAVENOUS
  Filled 2015-01-02 (×25): qty 200

## 2015-01-02 MED ORDER — ASPIRIN 300 MG RE SUPP: 300.0000 mg | RECTAL | Status: DC

## 2015-01-02 MED ORDER — ASPIRIN 81 MG PO CHEW
324.0000 mg | CHEWABLE_TABLET | Freq: Once | ORAL | Status: AC
  Administered 2015-01-02: 324 mg via ORAL
  Filled 2015-01-02: qty 4

## 2015-01-02 MED ORDER — ASPIRIN 81 MG PO CHEW: 324.0000 mg | CHEWABLE_TABLET | ORAL | Status: DC

## 2015-01-02 MED ORDER — MORPHINE SULFATE 4 MG/ML IJ SOLN
6.0000 mg | Freq: Once | INTRAMUSCULAR | Status: AC
  Administered 2015-01-02: 6 mg via INTRAVENOUS
  Filled 2015-01-02: qty 2

## 2015-01-02 MED ORDER — FAMOTIDINE 20 MG PO TABS
20.0000 mg | ORAL_TABLET | Freq: Every day | ORAL | Status: DC
  Administered 2015-01-03 – 2015-01-07 (×5): 20 mg via ORAL
  Filled 2015-01-02 (×5): qty 1

## 2015-01-02 MED ORDER — SODIUM CHLORIDE 0.9 % IV SOLN
Freq: Once | INTRAVENOUS | Status: AC
  Administered 2015-01-02: 12:00:00 via INTRAVENOUS

## 2015-01-02 MED ORDER — FAMOTIDINE 20 MG PO TABS
20.0000 mg | ORAL_TABLET | Freq: Once | ORAL | Status: AC
  Administered 2015-01-02: 20 mg via ORAL
  Filled 2015-01-02: qty 1

## 2015-01-02 MED ORDER — IOHEXOL 350 MG/ML SOLN
100.0000 mL | Freq: Once | INTRAVENOUS | Status: AC | PRN
  Administered 2015-01-02: 100 mL via INTRAVENOUS

## 2015-01-02 MED ORDER — SODIUM CHLORIDE 0.9 % IV SOLN
INTRAVENOUS | Status: DC
  Administered 2015-01-02 (×2): via INTRAVENOUS

## 2015-01-02 MED ORDER — MORPHINE SULFATE 4 MG/ML IJ SOLN
4.0000 mg | Freq: Once | INTRAMUSCULAR | Status: AC
  Administered 2015-01-02: 4 mg via INTRAVENOUS
  Filled 2015-01-02: qty 1

## 2015-01-02 MED ORDER — IOHEXOL 300 MG/ML  SOLN
100.0000 mL | Freq: Once | INTRAMUSCULAR | Status: AC | PRN
  Administered 2015-01-02: 100 mL via INTRAVENOUS

## 2015-01-02 MED ORDER — ONDANSETRON HCL 4 MG/2ML IJ SOLN
4.0000 mg | Freq: Four times a day (QID) | INTRAMUSCULAR | Status: DC | PRN
  Administered 2015-01-03: 4 mg via INTRAVENOUS
  Filled 2015-01-02: qty 2

## 2015-01-02 MED ORDER — METOPROLOL TARTRATE 25 MG PO TABS
25.0000 mg | ORAL_TABLET | Freq: Two times a day (BID) | ORAL | Status: DC
  Administered 2015-01-02: 25 mg via ORAL

## 2015-01-02 MED ORDER — FENTANYL CITRATE (PF) 100 MCG/2ML IJ SOLN
25.0000 ug | INTRAMUSCULAR | Status: DC | PRN
  Administered 2015-01-02 – 2015-01-04 (×13): 50 ug via INTRAVENOUS
  Filled 2015-01-02 (×13): qty 2

## 2015-01-02 MED ORDER — SODIUM CHLORIDE 0.9 % IV SOLN: 250.0000 mL | INTRAVENOUS | Status: DC | PRN

## 2015-01-02 NOTE — Consult Note (Signed)
Referred by:  [facility] ED  Reason for referral: aortic dissection  History of Present Illness  [name] is a 54 y.o. ([birth year]) male who presents with chief complaint: abdominal pain.  Patient notes onset of abdominal last night, described as achy, centered in epigastric region, no associated with any trigger.  Patient denies any nausea or vomiting.  He denies any hematoemesis or hematochezia or melena.  He does note drinking alcohol last night but denies binge drinking.  He denies any history of alcoholism.  He notes any prior history consistent with aortic dissection.  He denies any prior hypertension or family history of connective tissue disorders.  PAST MEDICAL HISTORY  Kidney stone  Perirectal abscess  PAST SURGICAL HISTORY  I&D perirectal abscess  History   Social History  . Marital Status: Divorced    Spouse Name: N/A  . Number of Children: N/A  . Years of Education: N/A   Occupational History  . Not on file.   Social History Main Topics  . Smoking status: Current Some Day Smoker    Types: Cigarettes  . Smokeless tobacco: Not on file  . Alcohol Use: Yes     Comment: occasionally  . Drug Use: No  . Sexual Activity: Not on file   Other Topics Concern  . Not on file   Social History Narrative    FAMILY HISTORY  Father: diabetes, Lung cancer  Mother: none  Current Facility-Administered Medications  Medication Dose Route Frequency Provider Last Rate Last Dose  . 0.9 %  sodium chloride infusion  250 mL Intravenous PRN Donita Brooks, NP      . 0.9 %  sodium chloride infusion   Intravenous Continuous Donita Brooks, NP      . aspirin chewable tablet 324 mg  324 mg Oral NOW Donita Brooks, NP       Or  . aspirin suppository 300 mg  300 mg Rectal NOW Donita Brooks, NP      . heparin injection 5,000 Units  5,000 Units Subcutaneous 3 times per day Donita Brooks, NP      . ondansetron (ZOFRAN) injection 4 mg  4 mg Intravenous Q6H PRN Donita Brooks,  NP       Current Outpatient Prescriptions  Medication Sig Dispense Refill  . cephALEXin (KEFLEX) 500 MG capsule Take 1 capsule (500 mg total) by mouth 4 (four) times daily. (Patient not taking: Reported on 01/02/2015) 28 capsule 0  . HYDROcodone-acetaminophen (NORCO/VICODIN) 5-325 MG per tablet Take 1 tablet by mouth every 6 (six) hours as needed for severe pain. (Patient not taking: Reported on 01/02/2015) 5 tablet 0     No Known Allergies   REVIEW OF SYSTEMS:  (Positives checked otherwise negative)  CARDIOVASCULAR:  []  chest pain, []  chest pressure, []  palpitations, []  shortness of breath when laying flat, []  shortness of breath with exertion,  []  pain in feet when walking, []  pain in feet when laying flat, []  history of blood clot in veins (DVT), []  history of phlebitis, []  swelling in legs, []  varicose veins  PULMONARY:  []  productive cough, []  asthma, []  wheezing  NEUROLOGIC:  []  weakness in arms or legs, []  numbness in arms or legs, []  difficulty speaking or slurred speech, []  temporary loss of vision in one eye, []  dizziness  HEMATOLOGIC:  []  bleeding problems, []  problems with blood clotting too easily  MUSCULOSKEL:  []  joint pain, []  joint swelling  GASTROINTEST:  []  vomiting blood, []   blood in stool , $Remov'[x]'mUHFOE$  Abd pain    GENITOURINARY:  $RemoveBeforeD'[]'uMtdelewKHVxeH$  burning with urination, $RemoveBeforeD'[]'wKbBEMivPhlsUh$  blood in urine  PSYCHIATRIC:  $RemoveBefor'[]'lYxVaIWaNUQu$  history of major depression  INTEGUMENTARY:  $RemoveBeforeD'[]'GQOIEvsyjbljrc$  rashes, $Remove'[]'BShxYEM$  ulcers  CONSTITUTIONAL:  $RemoveBeforeDE'[]'VNwmiOkQglbOPOC$  fever, $Remov'[]'JEikTf$  chills  For VQI Use Only   PRE-ADM LIVING: Home  AMB STATUS: Ambulatory  CAD Sx: None  PRIOR CHF: None  STRESS TEST: $RemoveBefo'[x]'FnGYojRdekc$  No, $Re'[ ]'UNH$  Normal, $Remove'[ ]'tjnSiIe$  + ischemia, $RemoveBef'[ ]'ymCUKwnNqk$  + MI, $Rem'[ ]'GVDV$  Both   Physical Examination  Filed Vitals:   01/02/15 1145 01/02/15 1215 01/02/15 1232 01/02/15 1245  BP: 174/104 157/87 159/94 141/87  Pulse: 69  72   Temp:      TempSrc:      Resp: 17  22   SpO2: 100% 100% 100% 100%    There is no weight on file to calculate BMI.  General: A&O x 3, WD, WN  Head:  [place]/AT  Ear/Nose/Throat: Hearing grossly intact, nares w/o erythema or drainage, oropharynx w/o Erythema/Exudate, Mallampati score: 3  Eyes: PERRLA, EOMI  Neck: Supple, no nuchal rigidity, no palpable LAD  Pulmonary: Sym exp, good air movt, CTAB, no rales, rhonchi, & wheezing  Cardiac: RRR, Nl S1, S2, no Murmurs, rubs or gallops  Vascular: Vessel Right Left  Radial Palpable Palpable  Ulnar Not Palpable Not Palpable  Brachial Palpable Palpable  Carotid Palpable, without bruit Palpable, without bruit  Aorta Not palpable N/A  Femoral Palpable Palpable  Popliteal Not palpable Not palpable  PT  Palpable  Palpable  DP  Palpable  Palpable   Gastrointestinal: soft, mild epigastric and LUQ TTP, ND, -G/R, - HSM, - masses, - CVAT B  Musculoskeletal: M/S 5/5 throughout , Extremities without ischemic changes   Neurologic: CN 2-12 intact , Pain and light touch intact in extremities , Motor exam as listed above  Psychiatric: Judgment intact, Mood & affect appropriate for pt's clinical situation  Dermatologic: See M/S exam for extremity exam, no rashes otherwise noted  Lymph : No Cervical, Axillary, or Inguinal lymphadenopathy   Laboratory: CBC:    Component Value Date/Time   WBC 11.1* 01/02/2015 0850   RBC 5.93* 01/02/2015 0850   HGB 15.7 01/02/2015 0850   HCT 47.3 01/02/2015 0850   PLT 235 01/02/2015 0850   MCV 79.8 01/02/2015 0850   MCH 26.5 01/02/2015 0850   MCHC 33.2 01/02/2015 0850   RDW 14.3 01/02/2015 0850   LYMPHSABS 2.7 01/02/2015 0850   MONOABS 0.9 01/02/2015 0850   EOSABS 0.1 01/02/2015 0850   BASOSABS 0.1 01/02/2015 0850    BMP:    Component Value Date/Time   NA 135 01/02/2015 0850   K 4.4 01/02/2015 0850   CL 102 01/02/2015 0850   CO2 23 01/02/2015 0850   GLUCOSE 100* 01/02/2015 0850   BUN 12 01/02/2015 0850   CREATININE 1.02 01/02/2015 0850   CALCIUM 9.1 01/02/2015 0850   GFRNONAA 82* 01/02/2015 0850   GFRAA >90 01/02/2015 0850   Hepatic Function  Panel     Component Value Date/Time   PROT 7.0 01/02/2015 0850   ALBUMIN 3.8 01/02/2015 0850   AST 25 01/02/2015 0850   ALT 18 01/02/2015 0850   ALKPHOS 101 01/02/2015 0850   BILITOT 1.0 01/02/2015 0850   Lipase: 776  Coagulation: No results found for: INR, PROTIME No results found for: PTT  Radiology: Dg Chest 2 View  01/02/2015   CLINICAL DATA:  Substernal chest pain since last night. Hx smoker  EXAM: CHEST  2 VIEW  COMPARISON:  None.  FINDINGS: The cardiac silhouette normal in size and configuration. Aorta is mildly uncoiled. No mediastinal or hilar masses or evidence of adenopathy.  Clear lungs.  No pleural effusion or pneumothorax.  Bony thorax is intact.  IMPRESSION: No active cardiopulmonary disease.   Electronically Signed   By: Lajean Manes M.D.   On: 01/02/2015 09:25   Ct Abdomen Pelvis W Contrast  01/02/2015   CLINICAL DATA:  54 year old male with a history of abdominal pain.  EXAM: CT ABDOMEN AND PELVIS WITH CONTRAST  TECHNIQUE: Multidetector CT imaging of the abdomen and pelvis was performed using the standard protocol following bolus administration of intravenous contrast.  CONTRAST:  125mL OMNIPAQUE IOHEXOL 300 MG/ML  SOLN  COMPARISON:  04/12/2009  FINDINGS: Lower chest:  Unremarkable appearance of the superficial soft tissues of the chest wall.  Heart size within normal limits.  No pericardial fluid/ thickening.  Mild circumferential thickening of the distal esophagus. No lower mediastinal adenopathy.  Minimal atelectasis/ scarring at the lung bases. No confluent airspace disease or pleural effusion.  Abdomen/ pelvis:  Diffusely decreased attenuation/enhancement of the liver parenchyma compatible with steatosis. Otherwise unremarkable appearance of the liver. Unremarkable spleen. Unremarkable bilateral adrenal glands. Unremarkable appearance of the gallbladder. Unremarkable appearance of the pancreas.  Right kidney:  No evidence of hydronephrosis. Partially excreted contrast  into the collecting system limits evaluation for kidney stones. The excretion is slightly asymmetric to the left which is delayed. Cortical phase does not appears asymmetric.  Left kidney:  No hydronephrosis. No nephrolithiasis. No excretion of contrast into the collecting system the the cortical phase does not appear delayed.  No intra abdominal air or free fluid.  No abnormally distended small bowel or colon. No transition point. Normal appendix.  There are a few colonic diverticula without associated inflammatory changes.  Vascular:  Aortic dissection, extending from the thoracic aorta through the hiatus to the bifurcation. The prior study was performed without contrast. The diameter of the aorta at the aortic hiatus measures 3 cm in greatest transverse diameter on the current study, 2.5 cm on the prior.  At the lower thoracic aorta, there is partial thrombus within the left lateral lumen. Assessment of change of the flow lumen is not possible with no contrast-enhanced comparison.  The patent flow lumen perfuses the celiac artery and superior mesenteric artery as well as the right renal artery.  Be low the aortic hiatus both flow channels are patent, with perfusion of the left renal artery from the lateral channel.  The dissection flap appears to terminate at the aortic bifurcation. Symmetric enhancement of the iliac vessels.  Scattered calcifications of the abdominal aorta. Greatest diameter just below the inferior mesenteric artery measures 2.8 cm. Greatest diameter on the comparison study measures 2.5 cm.  No periaortic fluid.  IMPRESSION: Aortic dissection, with the dissection flap extending from the thoracic aorta through the abdominal aorta, terminating at the iliac vessels. Chronicity is uncertain, as the comparison study was performed without contrast. The left lateral flow lumen is thrombosed in the lower chest, with patent flow within both the true and false lumen of the abdomen. The diameter of the  aorta at the aortic hiatus has grown to 3 cm compared to the CT of 2010, when the diameter was 2.5 cm. Greatest distal diameter just below the IMA measures 2.8 cm, compared to a previous diameter of 2.5 cm. Further evaluation with thoracoabdominal CT angiogram may be considered for further evaluation if no other previous studies are available for comparison.  Flow preserved within all of the mesenteric vessels, including bilateral renal arteries. There appears to be slight delay of left renal artery perfusion, given that the excretion of the left kidney is delayed. Perfusion of the iliac arteries is symmetric.  Circumferential soft tissue thickening of the distal esophagus. Recommend correlation with a history of GERD and potentially upper endoscopy.  No other finding of the abdomen/pelvis to account for the patient's complaints of abdominal pain.  These results were called by telephone at the time of interpretation on 01/02/2015 at 11:27 am to Dr. Elnora Morrison , who verbally acknowledged these results.  Signed,  Dulcy Fanny. Earleen Newport, DO  [facility]  [facility]   Electronically Signed   By: Corrie Mckusick D.O.   On: 01/02/2015 11:28   Ct Angio Chest Aorta W/cm &/or Wo/cm  01/02/2015   CLINICAL DATA:  Aortic dissection  EXAM: CT ANGIOGRAPHY CHEST, ABDOMEN AND PELVIS  TECHNIQUE: Multidetector CT imaging through the chest, abdomen and pelvis was performed using the standard protocol during bolus administration of intravenous contrast. Multiplanar reconstructed images and MIPs were obtained and reviewed to evaluate the vascular anatomy.  CONTRAST:  1107mL OMNIPAQUE IOHEXOL 350 MG/ML SOLN  COMPARISON:  Earlier today  FINDINGS: CTA CHEST FINDINGS  Aortic dissection begins just proximal to the left subclavian artery. The superior extent of the dissection flap appears fenestrated. The superior extent of the false lumen is patent and extends into the left subclavian artery. Below  the proximal left subclavian artery, the false lumen is thrombosed to the level of the distal descending thoracic aorta. Below this point, both the false and true lumen opacified with contrast. The true lumen becomes narrowed by approximately 60% within the thorax.  There is no evidence of aortic dissection in the ascending aorta. The innominate artery and left common carotid artery are patent and originates from native aortic arch. Innominate artery, right common carotid artery, right subclavian artery, left common carotid artery are patent. Bilateral vertebral arteries are patent within the confines of the examination.  Three vessel coronary artery calcifications are noted. Atherosclerotic changes of the arch are present.  The dissection flap extends into the left subclavian artery. The dissection flap ends in the proximal left subclavian artery. The thoracic component extends into the abdomen. Both lumens opacified in the left subclavian artery.  No pneumothorax.  No pleural effusion.  Bibasilar atelectasis.  Review of the MIP images confirms the above findings.  CTA ABDOMEN AND PELVIS FINDINGS  The aortic dissection extends into the abdomen and into the left common iliac artery.  Celiac origin is from the true lumen. It is patent. Branch vessels are patent.  SMA origin is from the true lumen. It is patent. Branch vessels are patent. Replaced right hepatic artery anatomy.  Two right renal arteries from the true lumen are patent. Single left renal artery origin is also patent. The dissection appears fenestrated at the level of the left renal artery which may originate from both the false and true lumen. The left renal artery is patent.  IMA origin from the false lumen is patent. Branch vessels are grossly patent.  Right common, internal, and external iliac arteries are patent. The dissection extends into the proximal left common iliac artery then terminates above its bifurcation. Left external and internal iliac  arteries are patent. Dissection in the left common iliac artery does not appear flow limiting.  Diffuse hepatic steatosis.  Gallbladder, spleen, pancreas, and adrenal glands are within normal limits. Tiny  hypodensity in the lower pole of the left kidney on image 186 is nonspecific.  Bladder and prostate are unremarkable.  Severe degenerative change of the right hip joint with juxta-articular cystic formation and sclerosis. Mild degenerative disc disease in the lower lumbar spine.  Review of the MIP images confirms the above findings.  IMPRESSION: There is an aortic dissection beginning in the thorax and extending throughout the abdominal aorta and to the left common iliac artery. The dissection begins between the takeoff of the left common carotid artery and left subclavian artery with extension into the left subclavian artery. It is non flow limiting within the left subclavian artery. This dissection is best characterized as a type B dissection which typically originates beyond the left subclavian artery. The false lumen is patent at the left subclavian artery but then is thrombosed in the proximal half of the descending thoracic aorta. The false lumen is re- perfused in the distal thoracic aorta and throughout the abdomen. There is no evidence of flow compromise in the visceral vasculature or or left common iliac artery.   Electronically Signed   By: Marybelle Killings M.D.   On: 01/02/2015 13:58   Ct Angio Abd/pel W/ And/or W/o  01/02/2015   CLINICAL DATA:  Aortic dissection  EXAM: CT ANGIOGRAPHY CHEST, ABDOMEN AND PELVIS  TECHNIQUE: Multidetector CT imaging through the chest, abdomen and pelvis was performed using the standard protocol during bolus administration of intravenous contrast. Multiplanar reconstructed images and MIPs were obtained and reviewed to evaluate the vascular anatomy.  CONTRAST:  154mL OMNIPAQUE IOHEXOL 350 MG/ML SOLN  COMPARISON:  Earlier today  FINDINGS: CTA CHEST FINDINGS  Aortic dissection  begins just proximal to the left subclavian artery. The superior extent of the dissection flap appears fenestrated. The superior extent of the false lumen is patent and extends into the left subclavian artery. Below the proximal left subclavian artery, the false lumen is thrombosed to the level of the distal descending thoracic aorta. Below this point, both the false and true lumen opacified with contrast. The true lumen becomes narrowed by approximately 60% within the thorax.  There is no evidence of aortic dissection in the ascending aorta. The innominate artery and left common carotid artery are patent and originates from native aortic arch. Innominate artery, right common carotid artery, right subclavian artery, left common carotid artery are patent. Bilateral vertebral arteries are patent within the confines of the examination.  Three vessel coronary artery calcifications are noted. Atherosclerotic changes of the arch are present.  The dissection flap extends into the left subclavian artery. The dissection flap ends in the proximal left subclavian artery. The thoracic component extends into the abdomen. Both lumens opacified in the left subclavian artery.  No pneumothorax.  No pleural effusion.  Bibasilar atelectasis.  Review of the MIP images confirms the above findings.  CTA ABDOMEN AND PELVIS FINDINGS  The aortic dissection extends into the abdomen and into the left common iliac artery.  Celiac origin is from the true lumen. It is patent. Branch vessels are patent.  SMA origin is from the true lumen. It is patent. Branch vessels are patent. Replaced right hepatic artery anatomy.  Two right renal arteries from the true lumen are patent. Single left renal artery origin is also patent. The dissection appears fenestrated at the level of the left renal artery which may originate from both the false and true lumen. The left renal artery is patent.  IMA origin from the false lumen is patent. Branch vessels are  grossly patent.  Right common, internal, and external iliac arteries are patent. The dissection extends into the proximal left common iliac artery then terminates above its bifurcation. Left external and internal iliac arteries are patent. Dissection in the left common iliac artery does not appear flow limiting.  Diffuse hepatic steatosis.  Gallbladder, spleen, pancreas, and adrenal glands are within normal limits. Tiny hypodensity in the lower pole of the left kidney on image 186 is nonspecific.  Bladder and prostate are unremarkable.  Severe degenerative change of the right hip joint with juxta-articular cystic formation and sclerosis. Mild degenerative disc disease in the lower lumbar spine.  Review of the MIP images confirms the above findings.  IMPRESSION: There is an aortic dissection beginning in the thorax and extending throughout the abdominal aorta and to the left common iliac artery. The dissection begins between the takeoff of the left common carotid artery and left subclavian artery with extension into the left subclavian artery. It is non flow limiting within the left subclavian artery. This dissection is best characterized as a type B dissection which typically originates beyond the left subclavian artery. The false lumen is patent at the left subclavian artery but then is thrombosed in the proximal half of the descending thoracic aorta. The false lumen is re- perfused in the distal thoracic aorta and throughout the abdomen. There is no evidence of flow compromise in the visceral vasculature or or left common iliac artery.   Electronically Signed   By: Marybelle Killings M.D.   On: 01/02/2015 13:58   Based on my review of his CTA chest/abd/pelvis, the patient has a aortic dissection from SCA down into L CIA.  The proximal false lumen appears thrombosed until a few centimeters proximal to diaphragmatic hiatus.  At this point, there must be another fenestration as both lumen refill again.  The CA, SMA, RA  and R CIA perfuse via true lumen.  The L RA and L CIA appear to perfuse via the false lumen.  I can clearly tell where in the left CIA is the re-entry tear.  Perfusion to both EIA appear intact.  Medical Decision Making  Snyder A Crewe is a 54 y.o. male who presents with: acute pancreatitis, likely chronic Type B aortic dissection without malperfusion, hypertension   Patient has no sx consistent with acute aortic dissection.  His history also does not provide any additional information suggestive of chronology of his dissection.  There is a suggestion of thrombosis of the proximal false lumen, which would be more likely with a chronic dissection.  I doubt an acute dissection would already have this extent of thrombosis.  Regardless, he has NO evidence of malperfusion so no immediate intervention is needed.  Management of Type B dissection favors medical mgmt.  There is no level I evidence yet for prophylactically treating Type B dissections with TEVAR.  Agree with Esmolol for treatment of his hypertension, as decreasing his dP/dt, i.e. impulse, helps prevent extension of the dissection.  Nipride or nitroglycerin can be added as needed on top of esmolol.  Target BP <130/90 recommended.  Once met, can transition to a oral regimen.  Frequently, UOP is also monitored with foley placement as a surrogate of mesenteric and renal perfusion.  In this patient, I don't think UOP will be too useful as he has one kidney perfusion off the true lumen and one perfusion off the false lumen.  Admittedly, UOP may need to be monitored if his pancreatitis worsens.  Routine monitoring of extremity  pulses also important, given extension of dissection into R SCA and R CIA.  Will defer further work-up of etiology of his pancreatitis to primary team.  Pt's exam is benign at this point, so I expect it to be self-limiting.  I have NO concern his aortic dissection is compromising his pancreatic perfusion.  Will  continue to follow with you.  Thank you for allowing [place] to participate in this patient's care.  Adele Barthel, MD [facility] Office: [phone] Pager: [phone]  01/02/2015, 2:19 PM

## 2015-01-02 NOTE — Progress Notes (Signed)
RT Note: RT attempted to place right arterial line. RT was able to get a flash of blood, but unsuccessful when threading catheter. Pt stated he did not want arterial line & refused RT to stick again. MD made aware, RT to monitor.

## 2015-01-02 NOTE — H&P (Signed)
PULMONARY / CRITICAL CARE MEDICINE   Name: [name] MRN: [id number] DOB: 01-27-1961    ADMISSION DATE:  01/02/2015 CONSULTATION DATE:  01/02/15  REFERRING MD :  Dr. Jodi Mourning  CHIEF COMPLAINT:  Aortic Dissection / HTN   INITIAL PRESENTATION: 54 y/o AAM with PMH of HTN (not treated) who presented to [facility] ER on 4/23 with complaints of abdominal / chest pain & pressure.  ER work up consistent with an aortic dissection (extending from the thoracic aorta through the abdominal aorta terminating at the iliac vessels) and hypertension.  PCCM called for ICU admission.   STUDIES:  4/23  CT Abd/Pelvis >> aortic dissection extending from the thoracic aorta through the abdominal aorta terminating at the iliac vessels, otherwise negative  4/23  CTA Chest, Abd/Pelvis >> aortic dissection beginning in the thorax, extending throughout the abdominal aorta to the L common iliac artery.  The dissection begins between the takeoff of the left common carotid artery and the L subclavian artery with extension into the L subclavian artery - characterized as a Type B dissection, no vital flow compromise    SIGNIFICANT EVENTS: 4/23  Admit with hypertension, chest/abd pain in setting of aortic dissection    HISTORY OF PRESENT ILLNESS:  54 y/o AAM, occasional smoker, with PMH of HTN (not treated) who presented to [facility] ER on 4/23 with complaints of abdominal / chest pain & pressure.  The patient reports he began having upper abdominal pressure on the evening of 4/22, went to bed despite pain.  He woke the am of presentation and was still hurting and went for evaluation.  Patient reports decreased appetite, nausea with one episode of small volume emesis, dizziness/lighteadedness, & sweating intermittently since pain began.  He endorses pain is worse with sitting. The pain/pressure radiated into back.   He denies a primary MD.  Has gone for free prostate screening at [facility] in the past.  The patient reports he was told several  years ago that he had high blood pressure but he could manage it "with his diet".  He did not follow up after that.  He indicates he has been essentially healthy to his knowledge.  He works as an Personnel officer.  The patient admits to occasional smoking when he drinks, marijuana and cocaine use.    He also reports feeling like his hand has been intermittently numb.  He was recently seen 4/14 for a bug bite and was given keflex / vicodin but didn't fill the meds.  He felt better once he got home.   PAST MEDICAL HISTORY :   has no past medical history on file.  has past surgical history that includes Rectal abscess.    Prior to Admission medications   Medication Sig Start Date End Date Taking? Authorizing Provider  cephALEXin (KEFLEX) 500 MG capsule Take 1 capsule (500 mg total) by mouth 4 (four) times daily. Patient not taking: Reported on 01/02/2015 12/24/14   Marissa Sciacca, PA-C  HYDROcodone-acetaminophen (NORCO/VICODIN) 5-325 MG per tablet Take 1 tablet by mouth every 6 (six) hours as needed for severe pain. Patient not taking: Reported on 01/02/2015 12/24/14   Marissa Sciacca, PA-C   No Known Allergies  FAMILY HISTORY:  has no family status information on file.    SOCIAL HISTORY:  reports that he has been smoking Cigarettes.  He does not have any smokeless tobacco history on file. He reports that he drinks alcohol. He reports that he does not use illicit drugs.  REVIEW OF SYSTEMS:  Gen: Denies fever, chills, weight change, fatigue, night sweats.  Reports sweating with n/v episodes HEENT: Denies blurred vision, double vision, hearing loss, tinnitus, sinus congestion, rhinorrhea, sore throat, neck stiffness, dysphagia PULM: Denies shortness of breath, cough, sputum production, hemoptysis, wheezing CV: Denies edema, orthopnea, paroxysmal nocturnal dyspnea, palpitations.  Reports intermittent "floating" chest and abdominal pain / pressure GI: Denies diarrhea, hematochezia, melena,  constipation, change in bowel habits.  Reports abdominal pain, nausea, vomiting GU: Denies dysuria, hematuria, polyuria, oliguria, urethral discharge Endocrine: Denies hot or cold intolerance, polyuria, polyphagia or appetite change Derm: Denies rash, dry skin, scaling or peeling skin change Heme: Denies easy bruising, bleeding, bleeding gums Neuro: Denies headache, numbness, weakness, slurred speech, loss of memory or consciousness   SUBJECTIVE:  Denies acute complaints at present.   VITAL SIGNS: Temp:  [98 F (36.7 C)] 98 F (36.7 C) (04/23 0825) Pulse Rate:  [65-72] 72 (04/23 1232) Resp:  [12-22] 22 (04/23 1232) BP: (141-174)/(85-104) 141/87 mmHg (04/23 1245) SpO2:  [98 %-100 %] 100 % (04/23 1245)   HEMODYNAMICS:     VENTILATOR SETTINGS:     INTAKE / OUTPUT: No intake or output data in the 24 hours ending 01/02/15 1329  PHYSICAL EXAMINATION: General:  wdwn adult male in NAD  Neuro:  AAOx4, speech clear, MAE  HEENT:  Mm pink/moist, no jvd Cardiovascular:  s1s2 rrr, no m/r/g Lungs:  resp's even/non-labored, lungs bilaterally clear  Abdomen:  Obese / soft, bsx4 active  Musculoskeletal:  No acute deformities  Skin:  Warm/dry, no edema   LABS:  CBC  Recent Labs Lab 01/02/15 0850  WBC 11.1*  HGB 15.7  HCT 47.3  PLT 235   Coag's No results for input(s): APTT, INR in the last 168 hours.   BMET  Recent Labs Lab 01/02/15 0850  NA 135  K 4.4  CL 102  CO2 23  BUN 12  CREATININE 1.02  GLUCOSE 100*   Electrolytes  Recent Labs Lab 01/02/15 0850  CALCIUM 9.1   Sepsis Markers No results for input(s): LATICACIDVEN, PROCALCITON, O2SATVEN in the last 168 hours.   ABG No results for input(s): PHART, PCO2ART, PO2ART in the last 168 hours.   Liver Enzymes  Recent Labs Lab 01/02/15 0850  AST 25  ALT 18  ALKPHOS 101  BILITOT 1.0  ALBUMIN 3.8   Cardiac Enzymes  Recent Labs Lab 01/02/15 0850  TROPONINI <0.03   Glucose No results for input(s):  GLUCAP in the last 168 hours.  Imaging No results found.   ASSESSMENT / PLAN:  PULMONARY OETT A: No acute issues  Tobacco Abuse  P:   Pulmonary hygiene  Pt counseled on smoking cessation  Oxygen as needed to support sats > 90%  CARDIOVASCULAR CVL A:  Tyep B Aortic Dissection - appears to have a degree of chronic dissection.   HTN - not on therapy prior to admit  P:  Esmolol gtt, goal SBP <140 Begin oral agent - lopressor 25 mg BID  Would add Nipride or NTG if needed in addition to esmolol if needed  Trend Troponin Assess ECHO  Assess CTA chest to r/o emergent surgical needs  VVS evaluation  Assess UDS ASA  RENAL A:   No acute issues  P:   Trend BMP  Replace electrolytes as indicated   GASTROINTESTINAL A:   Abdominal Pain  N/V - x1 episode 4/22 pm  Elevated Lipase / Pancreatitis  P:   Trend Lipase, amylase  Clear liquid diet as tolerated  Pepcid  HEMATOLOGIC A:   No acute issues.  P:  Trend CBC  Heparin for DVT prophylaxis   INFECTIOUS A:   Pancreatitis   P:   Monitor fever curve / WBC Monitor amylase / lipase Gentle hydration, NS @ 50 ml/hr Hold abx for now Assess procalcitonin protocol   ENDOCRINE A:   Mild Hyperglycemia  P:   Assess A1c  NEUROLOGIC A:   Pain  Hx ETOH, Polysubstance (occasional) usage P:   RASS goal: 0 PRN Fentanyl for pain  Patient counseled regarding polysubstance use and risks with HTN / dissection etc  FAMILY  - Updates: patient updated at bedside.  Questions answered in detail.    Canary Brim, NP-C [facility] Pgr: [phone] or [phone]    01/02/2015, 1:29 PM

## 2015-01-02 NOTE — ED Notes (Signed)
Pt returned from xray and placed back on the monitor.  

## 2015-01-02 NOTE — Plan of Care (Signed)
Problem: Phase I Progression Outcomes Goal: OOB as tolerated unless otherwise ordered Outcome: Not Progressing Currently bedrest due to blood pressure elevation .

## 2015-01-02 NOTE — ED Notes (Signed)
Pt reports new onset chest pain/epigastric pain last night worsened with walking/exertion and the sitting position. Pt reports diaphoresis x1 episode with this pain as well as 1 episode of nausea and vomiting yesterday. Pt reports it "goes through to my back"; Also reports some sob. Pt reports an umbilical hernia. Pt alert and oriented x4. Pt reports cardiac problems run in his family. Pt does not report any medical hx and is not prescribed any medications.

## 2015-01-02 NOTE — ED Notes (Signed)
Pt gone to CT at this time.

## 2015-01-02 NOTE — ED Notes (Signed)
CT notified that pt is ready for CT Angio STAT

## 2015-01-02 NOTE — ED Notes (Signed)
Re-paged x3 to [phone]

## 2015-01-02 NOTE — ED Notes (Signed)
Pt back from CT

## 2015-01-02 NOTE — Progress Notes (Signed)
eLink Physician-Brief Progress Note Patient Name: Frank PavlovDwayne A Torres DOB: 01/06/1961 MRN: [id number]   Date of Service  01/02/2015  HPI/Events of Note  UDS is positive for Cocaine. Patient is on an Esmolol IV infusion and Metoprolol.  eICU Interventions  Will order: 1. D/C Metoprolol. 2. Nicardipine IV infusion.  3. Wean Esmolol IV infusion off when Nicardipine IV infusion is up and running.      Intervention Category Major Interventions: Hypertension - evaluation and management  [name] 01/02/2015, 10:11 PM

## 2015-01-02 NOTE — ED Notes (Signed)
Critical care at bedside  

## 2015-01-02 NOTE — ED Provider Notes (Signed)
CSN: [id number]     Arrival date & time 01/02/15  0815 History   First MD Initiated Contact with Patient 01/02/15 0818     Chief Complaint  Patient presents with  . Abdominal Pain  . Chest Pain     (Consider location/radiation/quality/duration/timing/severity/associated sxs/prior Treatment) HPI Comments:  54 year old male with no significant medical history, smoker presents with epigastric and lower chest pressure since last night. Worse with sitting forward , mild depression cessation and was back but no tearing. Patient has family history of cardiac unsure ages , no known cardiac history, no blood clot history, no recent surgeries, no leg swelling. Epigastric discomfort is pressure and moves in location. Gradually worsening since last night. No fevers or chills. No  Chest pain at this time.  Patient is a 54 y.o. male presenting with abdominal pain and chest pain. The history is provided by the patient.  Abdominal Pain Associated symptoms: chest pain, nausea and shortness of breath   Associated symptoms: no chills, no dysuria, no fever and no vomiting   Chest Pain Associated symptoms: abdominal pain, nausea and shortness of breath   Associated symptoms: no back pain, no fever, no headache and not vomiting     History reviewed. No pertinent past medical history. Past Surgical History  Procedure Laterality Date  . Rectal abscess     History reviewed. No pertinent family history. History  Substance Use Topics  . Smoking status: Current Some Day Smoker    Types: Cigarettes  . Smokeless tobacco: Not on file  . Alcohol Use: Yes     Comment: occasionally    Review of Systems  Constitutional: Positive for appetite change. Negative for fever and chills.  HENT: Negative for congestion.   Eyes: Negative for visual disturbance.  Respiratory: Positive for shortness of breath.   Cardiovascular: Positive for chest pain. Negative for leg swelling.  Gastrointestinal: Positive for nausea  and abdominal pain. Negative for vomiting.  Genitourinary: Negative for dysuria and flank pain.  Musculoskeletal: Negative for back pain, neck pain and neck stiffness.  Skin: Negative for rash.  Neurological: Negative for light-headedness and headaches.      Allergies  Review of patient's allergies indicates no known allergies.  Home Medications   Prior to Admission medications   Medication Sig Start Date End Date Taking? Authorizing Provider  cephALEXin (KEFLEX) 500 MG capsule Take 1 capsule (500 mg total) by mouth 4 (four) times daily. Patient not taking: Reported on 01/02/2015 12/24/14   Marissa Sciacca, PA-C  HYDROcodone-acetaminophen (NORCO/VICODIN) 5-325 MG per tablet Take 1 tablet by mouth every 6 (six) hours as needed for severe pain. Patient not taking: Reported on 01/02/2015 12/24/14   Marissa Sciacca, PA-C   BP 141/87 mmHg  Pulse 72  Temp(Src) 98 F (36.7 C) (Oral)  Resp 22  SpO2 100% Physical Exam  Constitutional: He is oriented to person, place, and time. He appears well-developed and well-nourished.  HENT:  Head: Normocephalic and atraumatic.  Eyes: Conjunctivae are normal. Right eye exhibits no discharge. Left eye exhibits no discharge.  Neck: Normal range of motion. Neck supple. No tracheal deviation present.  Cardiovascular: Normal rate and regular rhythm.   Pulmonary/Chest: Effort normal and breath sounds normal.  Abdominal: Soft. He exhibits no distension. There is tenderness ( mild epigastric). There is no guarding.  Musculoskeletal: He exhibits no edema.  Neurological: He is alert and oriented to person, place, and time.  Skin: Skin is warm. No rash noted.  Psychiatric: He has a normal mood and  affect.  Nursing note and vitals reviewed.   ED Course  Procedures (including critical care time) CRITICAL CARE Performed by: Enid Skeens   Total critical care time: 75 min  Critical care time was exclusive of separately billable procedures and treating  other patients.  Critical care was necessary to treat or prevent imminent or life-threatening deterioration.  Critical care was time spent personally by me on the following activities: development of treatment plan with patient and/or surrogate as well as nursing, discussions with consultants, evaluation of patient's response to treatment, examination of patient, obtaining history from patient or surrogate, ordering and performing treatments and interventions, ordering and review of laboratory studies, ordering and review of radiographic studies, pulse oximetry and re-evaluation of patient's condition.  Emergency Ultrasound Study:   Angiocath insertion Performed by: Enid Skeens  Consent: Verbal consent obtained. Risks and benefits: risks, benefits and alternatives were discussed Immediately prior to procedure the correct patient, procedure, equipment, support staff and site/side marked as needed.  Indication: difficult IV access Preparation: Patient was prepped and draped in the usual sterile fashion. Vein Location: right ac vein was visualized during assessment for potential access sites and was found to be patent/ easily compressed with linear ultrasound.  The needle was visualized with real-time ultrasound and guided into the vein. Gauge: 20 g  Image saved and stored.  Normal blood return.  Patient tolerance: Patient tolerated the procedure well with no immediate complications.     Labs Review Labs Reviewed  COMPREHENSIVE METABOLIC PANEL - Abnormal; Notable for the following:    Glucose, Bld 100 (*)    GFR calc non Af Amer 82 (*)    All other components within normal limits  LIPASE, BLOOD - Abnormal; Notable for the following:    Lipase 776 (*)    All other components within normal limits  CBC WITH DIFFERENTIAL/PLATELET - Abnormal; Notable for the following:    WBC 11.1 (*)    RBC 5.93 (*)    All other components within normal limits  TROPONIN I    Imaging Review Dg  Chest 2 View  01/02/2015   CLINICAL DATA:  Substernal chest pain since last night. Hx smoker  EXAM: CHEST  2 VIEW  COMPARISON:  None.  FINDINGS: The cardiac silhouette normal in size and configuration. Aorta is mildly uncoiled. No mediastinal or hilar masses or evidence of adenopathy.  Clear lungs.  No pleural effusion or pneumothorax.  Bony thorax is intact.  IMPRESSION: No active cardiopulmonary disease.   Electronically Signed   By: Amie Portland M.D.   On: 01/02/2015 09:25   Ct Abdomen Pelvis W Contrast  01/02/2015   CLINICAL DATA:  54 year old male with a history of abdominal pain.  EXAM: CT ABDOMEN AND PELVIS WITH CONTRAST  TECHNIQUE: Multidetector CT imaging of the abdomen and pelvis was performed using the standard protocol following bolus administration of intravenous contrast.  CONTRAST:  OMNIPAQUE IOHEXOL 300 MG/ML  SOLN  COMPARISON:  04/12/2009  FINDINGS: Lower chest:  Unremarkable appearance of the superficial soft tissues of the chest wall.  Heart size within normal limits.  No pericardial fluid/ thickening.  Mild circumferential thickening of the distal esophagus. No lower mediastinal adenopathy.  Minimal atelectasis/ scarring at the lung bases. No confluent airspace disease or pleural effusion.  Abdomen/ pelvis:  Diffusely decreased attenuation/enhancement of the liver parenchyma compatible with steatosis. Otherwise unremarkable appearance of the liver. Unremarkable spleen. Unremarkable bilateral adrenal glands. Unremarkable appearance of the gallbladder. Unremarkable appearance of the pancreas.  Right kidney:  No evidence of hydronephrosis. Partially excreted contrast into the collecting system limits evaluation for kidney stones. The excretion is slightly asymmetric to the left which is delayed. Cortical phase does not appears asymmetric.  Left kidney:  No hydronephrosis. No nephrolithiasis. No excretion of contrast into the collecting system the the cortical phase does not appear  delayed.  No intra abdominal air or free fluid.  No abnormally distended small bowel or colon. No transition point. Normal appendix.  There are a few colonic diverticula without associated inflammatory changes.  Vascular:  Aortic dissection, extending from the thoracic aorta through the hiatus to the bifurcation. The prior study was performed without contrast. The diameter of the aorta at the aortic hiatus measures 3 cm in greatest transverse diameter on the current study, 2.5 cm on the prior.  At the lower thoracic aorta, there is partial thrombus within the left lateral lumen. Assessment of change of the flow lumen is not possible with no contrast-enhanced comparison.  The patent flow lumen perfuses the celiac artery and superior mesenteric artery as well as the right renal artery.  Be low the aortic hiatus both flow channels are patent, with perfusion of the left renal artery from the lateral channel.  The dissection flap appears to terminate at the aortic bifurcation. Symmetric enhancement of the iliac vessels.  Scattered calcifications of the abdominal aorta. Greatest diameter just below the inferior mesenteric artery measures 2.8 cm. Greatest diameter on the comparison study measures 2.5 cm.  No periaortic fluid.  IMPRESSION: Aortic dissection, with the dissection flap extending from the thoracic aorta through the abdominal aorta, terminating at the iliac vessels. Chronicity is uncertain, as the comparison study was performed without contrast. The left lateral flow lumen is thrombosed in the lower chest, with patent flow within both the true and false lumen of the abdomen. The diameter of the aorta at the aortic hiatus has grown to 3 cm compared to the CT of 2010, when the diameter was 2.5 cm. Greatest distal diameter just below the IMA measures 2.8 cm, compared to a previous diameter of 2.5 cm. Further evaluation with thoracoabdominal CT angiogram may be considered for further evaluation if no other previous  studies are available for comparison.  Flow preserved within all of the mesenteric vessels, including bilateral renal arteries. There appears to be slight delay of left renal artery perfusion, given that the excretion of the left kidney is delayed. Perfusion of the iliac arteries is symmetric.  Circumferential soft tissue thickening of the distal esophagus. Recommend correlation with a history of GERD and potentially upper endoscopy.  No other finding of the abdomen/pelvis to account for the patient's complaints of abdominal pain.  These results were called by telephone at the time of interpretation on 01/02/2015 at 11:27 am to Dr. Blane OharaJOSHUA Arayla Kruschke , who verbally acknowledged these results.  Signed,  Yvone NeuJaime S. Loreta AveWagner, DO  [facility]  [facility]   Electronically Signed   By: Gilmer MorJaime  Wagner D.O.   On: 01/02/2015 11:28     EKG Interpretation   Date/Time:  Saturday January 02 2015 08:21:51 EDT Ventricular Rate:  69 PR Interval:  152 QRS Duration: 87 QT Interval:  428 QTC Calculation: 458 R Axis:   -15 Text Interpretation:  Sinus rhythm Probable LVH with secondary repol abnrm  Inferior infarct, old T wave abnormalities diffuse, no old ekg Confirmed  by Ansley Stanwood  MD, Arlana Canizales ([id number]) on 01/02/2015 8:29:02 AM      MDM  Final diagnoses:  Epigastric abdominal pain  Chest pressure  Abnormal EKG  Aortic dissection  Alcohol-induced acute pancreatitis  Aortic dissection, thoracic    patient presents with both epigastric and chest pressure , well-appearing on exam, no peritonitis mild discomfort. Patient has atypical symptoms of multiple etiologies. Plan for screening for lipase , cardiac , GI related. It is worse with eating and sitting forward. EKG showed T wave abnormalities no old to compare to aspirin given in case atypical cardiac. Plan for CT scan  Evaluate for abdominal etiologies and delta troponin.   patient's pain improved initially and gradually  worsened. Discuss with radiology patient has aortic dissection on CT unable to tell if chronic or acute however there is widening compared to previous stone study. Patient does have chest and abdominal symptoms, asthma or drip ordered emergently specialist patient's blood pressure is elevated. Pain medicines ordered.   Lipase also came back elevated and patient is a chronic drinker. Patient has 2 possible etiologies of his pain including acute pancreatitis and acute dissection. Vascular surgery paged immediately pending consult. CT Angio for more details ordered. IV fluids ordered.  Repeat page to vascular, spoke with second on call physician recommended medical mgmt/ bp mgmt.  Spoke with ICU for admission.  BP and pain improved.    The patients results and plan were reviewed and discussed.   Any x-rays performed were personally reviewed by myself.   Differential diagnosis were considered with the presenting HPI.  Medications  ondansetron (ZOFRAN) injection 4 mg (4 mg Intravenous Given 01/02/15 0922)  aspirin chewable tablet 324 mg (324 mg Oral Given 01/02/15 0921)  morphine 4 MG/ML injection 4 mg (4 mg Intravenous Given 01/02/15 0922)  famotidine (PEPCID) tablet 20 mg (20 mg Oral Given 01/02/15 0947)  iohexol (OMNIPAQUE) 300 MG/ML solution 100 mL (100 mLs Intravenous Contrast Given 01/02/15 1033)  esmolol (BREVIBLOC) 2000 mg / 100 mL (20 mg/mL) infusion (25 mcg/kg/min  113.4 kg Intravenous New Bag/Given 01/02/15 1229)  0.9 %  sodium chloride infusion ( Intravenous New Bag/Given 01/02/15 1154)  morphine 4 MG/ML injection 6 mg (6 mg Intravenous Given 01/02/15 1216)    Filed Vitals:   01/02/15 1145 01/02/15 1215 01/02/15 1232 01/02/15 1245  BP: 174/104 157/87 159/94 141/87  Pulse: 69  72   Temp:      TempSrc:      Resp: 17  22   SpO2: 100% 100% 100% 100%    Final diagnoses:  Epigastric abdominal pain  Chest pressure  Abnormal EKG  Aortic dissection  Alcohol-induced acute pancreatitis   Aortic dissection, thoracic    Admission/ observation were discussed with the admitting physician, patient and/or family and they are comfortable with the plan.     Blane Ohara, MD 01/02/15 [phone]

## 2015-01-02 NOTE — ED Notes (Signed)
Pharmacy notified and advised to send BREVIBLOC infusion STAT - spoke with Mellody DanceKeith

## 2015-01-03 ENCOUNTER — Inpatient Hospital Stay (HOSPITAL_COMMUNITY): Payer: Self-pay

## 2015-01-03 DIAGNOSIS — K852 Alcohol induced acute pancreatitis without necrosis or infection: Secondary | ICD-10-CM | POA: Diagnosis present

## 2015-01-03 DIAGNOSIS — I71019 Dissection of thoracic aorta, unspecified: Secondary | ICD-10-CM | POA: Diagnosis present

## 2015-01-03 DIAGNOSIS — I7101 Dissection of thoracic aorta: Secondary | ICD-10-CM

## 2015-01-03 LAB — LIPASE, BLOOD: Lipase: 30 U/L (ref 11–59)

## 2015-01-03 LAB — BASIC METABOLIC PANEL
ANION GAP: 11 (ref 5–15)
BUN: 9 mg/dL (ref 6–23)
CALCIUM: 8.3 mg/dL — AB (ref 8.4–10.5)
CO2: 19 mmol/L (ref 19–32)
CREATININE: 0.84 mg/dL (ref 0.50–1.35)
Chloride: 106 mmol/L (ref 96–112)
GFR calc Af Amer: >90 mL/min (ref >90)
GLUCOSE: 82 mg/dL (ref 70–99)
Potassium: 4.2 mmol/L (ref 3.5–5.1)
Sodium: 136 mmol/L (ref 135–145)

## 2015-01-03 LAB — CBC
HCT: 46.4 % (ref 39.0–52.0)
HEMOGLOBIN: 15 g/dL (ref 13.0–17.0)
MCH: 25.9 pg — ABNORMAL LOW (ref 26.0–34.0)
MCHC: 32.3 g/dL (ref 30.0–36.0)
MCV: 80.1 fL (ref 78.0–100.0)
Platelets: 222 10*3/uL (ref 150–400)
RBC: 5.79 MIL/uL (ref 4.22–5.81)
RDW: 14.5 % (ref 11.5–15.5)
WBC: 10.2 10*3/uL (ref 4.0–10.5)

## 2015-01-03 LAB — TROPONIN I
Troponin I: <0.03 ng/mL (ref <0.031)
Troponin I: <0.03 ng/mL (ref <0.031)

## 2015-01-03 LAB — PROCALCITONIN

## 2015-01-03 LAB — AMYLASE: Amylase: 102 U/L (ref 0–105)

## 2015-01-03 MED ORDER — DILTIAZEM HCL 30 MG PO TABS
30.0000 mg | ORAL_TABLET | Freq: Four times a day (QID) | ORAL | Status: DC
  Administered 2015-01-03 – 2015-01-04 (×3): 30 mg via ORAL
  Filled 2015-01-03 (×7): qty 1

## 2015-01-03 MED ORDER — ESMOLOL HCL-SODIUM CHLORIDE 2000 MG/100ML IV SOLN
25.0000 ug/kg/min | INTRAVENOUS | Status: DC
  Administered 2015-01-03: 15 ug/kg/min via INTRAVENOUS
  Filled 2015-01-03 (×2): qty 100

## 2015-01-03 MED ORDER — LOSARTAN POTASSIUM 50 MG PO TABS
50.0000 mg | ORAL_TABLET | Freq: Every day | ORAL | Status: DC
  Administered 2015-01-03 – 2015-01-05 (×3): 50 mg via ORAL
  Filled 2015-01-03 (×3): qty 1

## 2015-01-03 NOTE — Progress Notes (Signed)
eLink Physician-Brief Progress Note Patient Name: [name] DOB: 12/06/1960 MRN: [id number]   Date of Service  01/03/2015  HPI/Events of Note  Remains of Esmolol and Nicardipine IV infusions for Hypertension.  eICU Interventions  Will add Cardizem 30 mg PO Q 6 hours in hope to wean of IV infusions.      Intervention Category Major Interventions: Hypertension - evaluation and management  [name] 01/03/2015, 7:54 PM

## 2015-01-03 NOTE — Progress Notes (Signed)
PULMONARY / CRITICAL CARE MEDICINE   Name: [name] MRN: [id number] DOB: [birth year]    ADMISSION DATE:  01/02/2015 CONSULTATION DATE:  01/02/15  REFERRING MD :  Dr. Jodi Mourning  CHIEF COMPLAINT:  Aortic Dissection / HTN   Brief HPI: 54 y/o AAM with PMH of HTN (not treated) who presented to [facility] ER on 4/23 with complaints of abdominal / chest pain & pressure.  ER work up consistent with an aortic dissection (extending from the thoracic aorta through the abdominal aorta terminating at the iliac vessels) and hypertension.  PCCM called for ICU admission.   STUDIES:  4/23  CT Abd/Pelvis >> aortic dissection extending from the thoracic aorta through the abdominal aorta terminating at the iliac vessels, otherwise negative  4/23  CTA Chest, Abd/Pelvis >> aortic dissection beginning in the thorax, extending throughout the abdominal aorta to the L common iliac artery.  The dissection begins between the takeoff of the left common carotid artery and the L subclavian artery with extension into the L subclavian artery - characterized as a Type B dissection, no vital flow compromise  4/23 2D echo >>>pending   SIGNIFICANT EVENTS: 4/23  Admit with hypertension, chest/abd pain in setting of aortic dissection, UDS+cocaine   SUBJECTIVE:   Afebrile Epigastric pain much improved Denies N/V He is hungry, wants to try clears  VITAL SIGNS: Temp:  [98 F (36.7 C)-99.1 F (37.3 C)] 99 F (37.2 C) (04/24 0400) Pulse Rate:  [60-111] 70 (04/24 0600) Resp:  [11-22] 15 (04/24 0600) BP: (114-181)/(12-105) 125/57 mmHg (04/24 0600) SpO2:  [84 %-100 %] 93 % (04/24 0600) Weight:  [253 lb 1.4 oz (114.8 kg)-255 lb 15.3 oz (116.1 kg)] 255 lb 15.3 oz (116.1 kg) (04/24 0500)       INTAKE / OUTPUT:  Intake/Output Summary (Last 24 hours) at 01/03/15 0823 Last data filed at 01/03/15 0600  Gross per 24 hour  Intake 2272.33 ml  Output   1050 ml  Net 1222.33 ml    PHYSICAL EXAMINATION: General: Resting in bed, in  NAD Neuro:  AAOx4, speech clear, MAE  HEENT:  Mm pink/moist Cardiovascular:  s1s2 rrr, no m/r/g Lungs:  CTAB, no respiratory distress Abdomen:  Obese / soft, bsx4 active  Musculoskeletal:  No acute deformities, no edema, no cyanosis Skin:  Warm/dry, no rash noted  LABS:  CBC  Recent Labs Lab 01/02/15 0850 01/03/15 0345  WBC 11.1* 10.2  HGB 15.7 15.0  HCT 47.3 46.4  PLT 235 222   Coag's No results for input(s): APTT, INR in the last 168 hours.   BMET  Recent Labs Lab 01/02/15 0850 01/03/15 0345  NA 135 136  K 4.4 4.2  CL 102 106  CO2 23 19  BUN 12 9  CREATININE 1.02 0.84  GLUCOSE 100* 82   Electrolytes  Recent Labs Lab 01/02/15 0850 01/03/15 0345  CALCIUM 9.1 8.3*   Sepsis Markers  Recent Labs Lab 01/02/15 1625 01/02/15 1718  LATICACIDVEN  --  2.2*  PROCALCITON <0.10  --      ABG No results for input(s): PHART, PCO2ART, PO2ART in the last 168 hours.   Liver Enzymes  Recent Labs Lab 01/02/15 0850  AST 25  ALT 18  ALKPHOS 101  BILITOT 1.0  ALBUMIN 3.8   Cardiac Enzymes  Recent Labs Lab 01/02/15 0850 01/02/15 1718 01/02/15 2328  TROPONINI <0.03 <0.03 <0.03   Glucose No results for input(s): GLUCAP in the last 168 hours.  Imaging Dg Chest 2 View  01/02/2015  CLINICAL DATA:  Substernal chest pain since last night. Hx smoker  EXAM: CHEST  2 VIEW  COMPARISON:  None.  FINDINGS: The cardiac silhouette normal in size and configuration. Aorta is mildly uncoiled. No mediastinal or hilar masses or evidence of adenopathy.  Clear lungs.  No pleural effusion or pneumothorax.  Bony thorax is intact.  IMPRESSION: No active cardiopulmonary disease.   Electronically Signed   By: Amie Portland M.D.   On: 01/02/2015 09:25   Ct Abdomen Pelvis W Contrast  01/02/2015   CLINICAL DATA:  54 year old male with a history of abdominal pain.  EXAM: CT ABDOMEN AND PELVIS WITH CONTRAST  TECHNIQUE: Multidetector CT imaging of the abdomen and pelvis was performed  using the standard protocol following bolus administration of intravenous contrast.  CONTRAST:  OMNIPAQUE IOHEXOL 300 MG/ML  SOLN  COMPARISON:  04/12/2009  FINDINGS: Lower chest:  Unremarkable appearance of the superficial soft tissues of the chest wall.  Heart size within normal limits.  No pericardial fluid/ thickening.  Mild circumferential thickening of the distal esophagus. No lower mediastinal adenopathy.  Minimal atelectasis/ scarring at the lung bases. No confluent airspace disease or pleural effusion.  Abdomen/ pelvis:  Diffusely decreased attenuation/enhancement of the liver parenchyma compatible with steatosis. Otherwise unremarkable appearance of the liver. Unremarkable spleen. Unremarkable bilateral adrenal glands. Unremarkable appearance of the gallbladder. Unremarkable appearance of the pancreas.  Right kidney:  No evidence of hydronephrosis. Partially excreted contrast into the collecting system limits evaluation for kidney stones. The excretion is slightly asymmetric to the left which is delayed. Cortical phase does not appears asymmetric.  Left kidney:  No hydronephrosis. No nephrolithiasis. No excretion of contrast into the collecting system the the cortical phase does not appear delayed.  No intra abdominal air or free fluid.  No abnormally distended small bowel or colon. No transition point. Normal appendix.  There are a few colonic diverticula without associated inflammatory changes.  Vascular:  Aortic dissection, extending from the thoracic aorta through the hiatus to the bifurcation. The prior study was performed without contrast. The diameter of the aorta at the aortic hiatus measures 3 cm in greatest transverse diameter on the current study, 2.5 cm on the prior.  At the lower thoracic aorta, there is partial thrombus within the left lateral lumen. Assessment of change of the flow lumen is not possible with no contrast-enhanced comparison.  The patent flow lumen perfuses the celiac  artery and superior mesenteric artery as well as the right renal artery.  Be low the aortic hiatus both flow channels are patent, with perfusion of the left renal artery from the lateral channel.  The dissection flap appears to terminate at the aortic bifurcation. Symmetric enhancement of the iliac vessels.  Scattered calcifications of the abdominal aorta. Greatest diameter just below the inferior mesenteric artery measures 2.8 cm. Greatest diameter on the comparison study measures 2.5 cm.  No periaortic fluid.  IMPRESSION: Aortic dissection, with the dissection flap extending from the thoracic aorta through the abdominal aorta, terminating at the iliac vessels. Chronicity is uncertain, as the comparison study was performed without contrast. The left lateral flow lumen is thrombosed in the lower chest, with patent flow within both the true and false lumen of the abdomen. The diameter of the aorta at the aortic hiatus has grown to 3 cm compared to the CT of 2010, when the diameter was 2.5 cm. Greatest distal diameter just below the IMA measures 2.8 cm, compared to a previous diameter of 2.5 cm. Further  evaluation with thoracoabdominal CT angiogram may be considered for further evaluation if no other previous studies are available for comparison.  Flow preserved within all of the mesenteric vessels, including bilateral renal arteries. There appears to be slight delay of left renal artery perfusion, given that the excretion of the left kidney is delayed. Perfusion of the iliac arteries is symmetric.  Circumferential soft tissue thickening of the distal esophagus. Recommend correlation with a history of GERD and potentially upper endoscopy.  No other finding of the abdomen/pelvis to account for the patient's complaints of abdominal pain.  These results were called by telephone at the time of interpretation on 01/02/2015 at 11:27 am to Dr. Blane Ohara , who verbally acknowledged these results.  Signed,  Yvone Neu. Loreta Ave,  DO  [facility]  [facility]   Electronically Signed   By: Gilmer Mor D.O.   On: 01/02/2015 11:28   Ct Angio Chest Aorta W/cm &/or Wo/cm  01/02/2015   CLINICAL DATA:  Aortic dissection  EXAM: CT ANGIOGRAPHY CHEST, ABDOMEN AND PELVIS  TECHNIQUE: Multidetector CT imaging through the chest, abdomen and pelvis was performed using the standard protocol during bolus administration of intravenous contrast. Multiplanar reconstructed images and MIPs were obtained and reviewed to evaluate the vascular anatomy.  CONTRAST:  OMNIPAQUE IOHEXOL 350 MG/ML SOLN  COMPARISON:  Earlier today  FINDINGS: CTA CHEST FINDINGS  Aortic dissection begins just proximal to the left subclavian artery. The superior extent of the dissection flap appears fenestrated. The superior extent of the false lumen is patent and extends into the left subclavian artery. Below the proximal left subclavian artery, the false lumen is thrombosed to the level of the distal descending thoracic aorta. Below this point, both the false and true lumen opacified with contrast. The true lumen becomes narrowed by approximately 60% within the thorax.  There is no evidence of aortic dissection in the ascending aorta. The innominate artery and left common carotid artery are patent and originates from native aortic arch. Innominate artery, right common carotid artery, right subclavian artery, left common carotid artery are patent. Bilateral vertebral arteries are patent within the confines of the examination.  Three vessel coronary artery calcifications are noted. Atherosclerotic changes of the arch are present.  The dissection flap extends into the left subclavian artery. The dissection flap ends in the proximal left subclavian artery. The thoracic component extends into the abdomen. Both lumens opacified in the left subclavian artery.  No pneumothorax.  No pleural effusion.  Bibasilar atelectasis.  Review of the MIP  images confirms the above findings.  CTA ABDOMEN AND PELVIS FINDINGS  The aortic dissection extends into the abdomen and into the left common iliac artery.  Celiac origin is from the true lumen. It is patent. Branch vessels are patent.  SMA origin is from the true lumen. It is patent. Branch vessels are patent. Replaced right hepatic artery anatomy.  Two right renal arteries from the true lumen are patent. Single left renal artery origin is also patent. The dissection appears fenestrated at the level of the left renal artery which may originate from both the false and true lumen. The left renal artery is patent.  IMA origin from the false lumen is patent. Branch vessels are grossly patent.  Right common, internal, and external iliac arteries are patent. The dissection extends into the proximal left common iliac artery then terminates above its bifurcation. Left external and internal iliac arteries are patent. Dissection in the left common iliac artery  does not appear flow limiting.  Diffuse hepatic steatosis.  Gallbladder, spleen, pancreas, and adrenal glands are within normal limits. Tiny hypodensity in the lower pole of the left kidney on image 186 is nonspecific.  Bladder and prostate are unremarkable.  Severe degenerative change of the right hip joint with juxta-articular cystic formation and sclerosis. Mild degenerative disc disease in the lower lumbar spine.  Review of the MIP images confirms the above findings.  IMPRESSION: There is an aortic dissection beginning in the thorax and extending throughout the abdominal aorta and to the left common iliac artery. The dissection begins between the takeoff of the left common carotid artery and left subclavian artery with extension into the left subclavian artery. It is non flow limiting within the left subclavian artery. This dissection is best characterized as a type B dissection which typically originates beyond the left subclavian artery. The false lumen is patent  at the left subclavian artery but then is thrombosed in the proximal half of the descending thoracic aorta. The false lumen is re- perfused in the distal thoracic aorta and throughout the abdomen. There is no evidence of flow compromise in the visceral vasculature or or left common iliac artery.   Electronically Signed   By: Jolaine Click M.D.   On: 01/02/2015 13:58   Ct Angio Abd/pel W/ And/or W/o  01/02/2015   CLINICAL DATA:  Aortic dissection  EXAM: CT ANGIOGRAPHY CHEST, ABDOMEN AND PELVIS  TECHNIQUE: Multidetector CT imaging through the chest, abdomen and pelvis was performed using the standard protocol during bolus administration of intravenous contrast. Multiplanar reconstructed images and MIPs were obtained and reviewed to evaluate the vascular anatomy.  CONTRAST:  OMNIPAQUE IOHEXOL 350 MG/ML SOLN  COMPARISON:  Earlier today  FINDINGS: CTA CHEST FINDINGS  Aortic dissection begins just proximal to the left subclavian artery. The superior extent of the dissection flap appears fenestrated. The superior extent of the false lumen is patent and extends into the left subclavian artery. Below the proximal left subclavian artery, the false lumen is thrombosed to the level of the distal descending thoracic aorta. Below this point, both the false and true lumen opacified with contrast. The true lumen becomes narrowed by approximately 60% within the thorax.  There is no evidence of aortic dissection in the ascending aorta. The innominate artery and left common carotid artery are patent and originates from native aortic arch. Innominate artery, right common carotid artery, right subclavian artery, left common carotid artery are patent. Bilateral vertebral arteries are patent within the confines of the examination.  Three vessel coronary artery calcifications are noted. Atherosclerotic changes of the arch are present.  The dissection flap extends into the left subclavian artery. The dissection flap ends in the  proximal left subclavian artery. The thoracic component extends into the abdomen. Both lumens opacified in the left subclavian artery.  No pneumothorax.  No pleural effusion.  Bibasilar atelectasis.  Review of the MIP images confirms the above findings.  CTA ABDOMEN AND PELVIS FINDINGS  The aortic dissection extends into the abdomen and into the left common iliac artery.  Celiac origin is from the true lumen. It is patent. Branch vessels are patent.  SMA origin is from the true lumen. It is patent. Branch vessels are patent. Replaced right hepatic artery anatomy.  Two right renal arteries from the true lumen are patent. Single left renal artery origin is also patent. The dissection appears fenestrated at the level of the left renal artery which may originate from both the  false and true lumen. The left renal artery is patent.  IMA origin from the false lumen is patent. Branch vessels are grossly patent.  Right common, internal, and external iliac arteries are patent. The dissection extends into the proximal left common iliac artery then terminates above its bifurcation. Left external and internal iliac arteries are patent. Dissection in the left common iliac artery does not appear flow limiting.  Diffuse hepatic steatosis.  Gallbladder, spleen, pancreas, and adrenal glands are within normal limits. Tiny hypodensity in the lower pole of the left kidney on image 186 is nonspecific.  Bladder and prostate are unremarkable.  Severe degenerative change of the right hip joint with juxta-articular cystic formation and sclerosis. Mild degenerative disc disease in the lower lumbar spine.  Review of the MIP images confirms the above findings.  IMPRESSION: There is an aortic dissection beginning in the thorax and extending throughout the abdominal aorta and to the left common iliac artery. The dissection begins between the takeoff of the left common carotid artery and left subclavian artery with extension into the left  subclavian artery. It is non flow limiting within the left subclavian artery. This dissection is best characterized as a type B dissection which typically originates beyond the left subclavian artery. The false lumen is patent at the left subclavian artery but then is thrombosed in the proximal half of the descending thoracic aorta. The false lumen is re- perfused in the distal thoracic aorta and throughout the abdomen. There is no evidence of flow compromise in the visceral vasculature or or left common iliac artery.   Electronically Signed   By: Jolaine Click M.D.   On: 01/02/2015 13:58     ASSESSMENT / PLAN:  PULMONARY OETT A: No acute issues  Tobacco Abuse  P:   Pt counseled on smoking cessation  Oxygen as needed to support sats > 90%  CARDIOVASCULAR CVL A:  Type B Aortic Dissection - appears w chronic dissection, trop neg x3.   HTN - not on therapy prior to admit  Cocaine + per UDS P:  Vascular Surgery following  Weaning off Esmolol gtt, given UDS +cocaine Continue cardene drip, transition to orals once tolerating PO  F/u on ECHO ordered on 4/23 Start losartan  daily May start amolidipine once off cardene drip   RENAL A:   No acute issues  P:   Trend BMP  Replace electrolytes as indicated  F/u on Mg and phosph tomorrow am  GASTROINTESTINAL A:   Abdominal Pain  N/V - x1 episode 4/22 pm  Elevated Lipase / Pancreatitis  P:   Trend Lipase, amylase  Clear liquid diet as tolerated  Pepcid   HEMATOLOGIC A:   No acute issues.  P:  Trend CBC  Heparin for DVT prophylaxis   INFECTIOUS A:   Pancreatitis - in setting of EtOH intake. Lipase normal, pro calcitonin <10 Leucocytosis - mild, likely distress demargination. Resolved P:   Discontinue IVF NS @ 50 ml/hr now that he is tolerating PO intake Hold abx for now Advance diet as tolerated   ENDOCRINE A:   Mild Hyperglycemia  P:   F/u Hgb A1c  NEUROLOGIC A:   Pain  Hx ETOH, Polysubstance (occasional)  usage Cocaine positive on UDS P:   RASS goal: 0 PRN Fentanyl for pain  Patient counseled regarding polysubstance use and risks with HTN / dissection etc  FAMILY  - Updates: No family at bedside on 4/24  Summary: 71 yr man with type B chronic AAA, no  PCP, polysubstance abuse (EtOH, cocaine, tobacco), presenting with abd pain mild pancreatitis. Epigastric pain improved, BP improving with cardene drip. Vascular surgery with no plans for intervention at this time given chronic AAA with no acute decompensation. No N/V, epigastric pain much improved, hungry, read to try clear liquid diet. If BP improved by tomorrow off cardene drip, will likely be transferred to Med Surg on 4/26.   Ky BarbanKennerly, Anitra Doxtater D, MD IMTS, PGY3 01/03/2015, 8:23 AM

## 2015-01-03 NOTE — Progress Notes (Signed)
   Daily Progress Note  Assessment/Planning: Likely chronic Type B aortic dissection without malperfusion, resolving pancreatitis   Strict BP control with target 120/80.  I had a frank discussion with this patient of the multiple life-threatening complications that are possible from aortic dissection.  He is aware he need to be religious in medication compliance and cessation of ilicit drug use  Will continue to follow.  No surgical intervention needed at this point.   Subjective    Feels better  Objective Filed Vitals:   01/03/15 0430 01/03/15 0500 01/03/15 0530 01/03/15 0600  BP: 135/59 121/46 123/75 125/57  Pulse: 62 73 71 70  Temp:      TempSrc:      Resp: 13 11 15 15   Weight:  255 lb 15.3 oz (116.1 kg)    SpO2: 95% 96% 96% 93%    Intake/Output Summary (Last 24 hours) at 01/03/15 0815 Last data filed at 01/03/15 0600  Gross per 24 hour  Intake 2272.33 ml  Output   1050 ml  Net 1222.33 ml    PULM  CTAB CV  RRR, on Esmolol drip GI  soft, NTND VASC  B DP strong (L>R)  Laboratory CBC    Component Value Date/Time   WBC 10.2 01/03/2015 0345   HGB 15.0 01/03/2015 0345   HCT 46.4 01/03/2015 0345   PLT 222 01/03/2015 0345    BMET    Component Value Date/Time   NA 136 01/03/2015 0345   K 4.2 01/03/2015 0345   CL 106 01/03/2015 0345   CO2 19 01/03/2015 0345   GLUCOSE 82 01/03/2015 0345   BUN 9 01/03/2015 0345   CREATININE 0.84 01/03/2015 0345   CALCIUM 8.3* 01/03/2015 0345   GFRNONAA >90 01/03/2015 0345   GFRAA >90 01/03/2015 0345    Leonides SakeBrian Aloysius Heinle, MD [facility] [place] Office: [phone] Pager: [phone]  01/03/2015, 8:15 AM

## 2015-01-04 DIAGNOSIS — I7101 Dissection of thoracic aorta: Secondary | ICD-10-CM

## 2015-01-04 DIAGNOSIS — I10 Essential (primary) hypertension: Secondary | ICD-10-CM

## 2015-01-04 LAB — BASIC METABOLIC PANEL
Anion gap: 12 (ref 5–15)
BUN: 9 mg/dL (ref 6–23)
CHLORIDE: 101 mmol/L (ref 96–112)
CO2: 17 mmol/L — ABNORMAL LOW (ref 19–32)
Calcium: 7.6 mg/dL — ABNORMAL LOW (ref 8.4–10.5)
Creatinine, Ser: 0.86 mg/dL (ref 0.50–1.35)
GFR calc Af Amer: >90 mL/min (ref >90)
GLUCOSE: 115 mg/dL — AB (ref 70–99)
Potassium: 3.8 mmol/L (ref 3.5–5.1)
Sodium: 130 mmol/L — ABNORMAL LOW (ref 135–145)

## 2015-01-04 LAB — HEMOGLOBIN A1C
Hgb A1c MFr Bld: 6.1 % — ABNORMAL HIGH (ref 4.8–5.6)
Mean Plasma Glucose: 128 mg/dL

## 2015-01-04 LAB — HIV ANTIBODY (ROUTINE TESTING W REFLEX): HIV Screen 4th Generation wRfx: NONREACTIVE

## 2015-01-04 LAB — MAGNESIUM: MAGNESIUM: 1.8 mg/dL (ref 1.5–2.5)

## 2015-01-04 LAB — PHOSPHORUS: PHOSPHORUS: 2.6 mg/dL (ref 2.3–4.6)

## 2015-01-04 LAB — PROCALCITONIN

## 2015-01-04 MED ORDER — NICARDIPINE HCL IN NACL 40-0.83 MG/200ML-% IV SOLN
3.0000 mg/h | INTRAVENOUS | Status: DC
Start: 2015-01-04 — End: 2015-01-06
  Administered 2015-01-04: 7 mg/h via INTRAVENOUS
  Administered 2015-01-04: 15 mg/h via INTRAVENOUS
  Administered 2015-01-05: 3 mg/h via INTRAVENOUS
  Filled 2015-01-04 (×5): qty 200

## 2015-01-04 MED ORDER — FUROSEMIDE 10 MG/ML IJ SOLN
40.0000 mg | Freq: Three times a day (TID) | INTRAMUSCULAR | Status: AC
  Administered 2015-01-04 (×2): 40 mg via INTRAVENOUS
  Filled 2015-01-04 (×2): qty 4

## 2015-01-04 MED ORDER — DILTIAZEM HCL 60 MG PO TABS
60.0000 mg | ORAL_TABLET | Freq: Four times a day (QID) | ORAL | Status: DC
  Administered 2015-01-04 – 2015-01-05 (×4): 60 mg via ORAL
  Filled 2015-01-04 (×8): qty 1

## 2015-01-04 MED ORDER — DOCUSATE SODIUM 100 MG PO CAPS
100.0000 mg | ORAL_CAPSULE | Freq: Two times a day (BID) | ORAL | Status: DC
  Administered 2015-01-04 – 2015-01-07 (×7): 100 mg via ORAL
  Filled 2015-01-04 (×9): qty 1

## 2015-01-04 MED ORDER — AMLODIPINE BESYLATE 5 MG PO TABS
5.0000 mg | ORAL_TABLET | Freq: Every day | ORAL | Status: DC
  Administered 2015-01-04 – 2015-01-05 (×2): 5 mg via ORAL
  Filled 2015-01-04 (×2): qty 1

## 2015-01-04 MED ORDER — MAGNESIUM SULFATE 2 GM/50ML IV SOLN
2.0000 g | Freq: Once | INTRAVENOUS | Status: AC
  Administered 2015-01-04: 2 g via INTRAVENOUS
  Filled 2015-01-04: qty 50

## 2015-01-04 MED ORDER — BLOOD PRESSURE CONTROL BOOK
Freq: Once | Status: AC
  Administered 2015-01-04: 13:00:00
  Filled 2015-01-04: qty 1

## 2015-01-04 MED ORDER — POTASSIUM CHLORIDE CRYS ER 20 MEQ PO TBCR
40.0000 meq | EXTENDED_RELEASE_TABLET | Freq: Three times a day (TID) | ORAL | Status: AC
  Administered 2015-01-04 (×2): 40 meq via ORAL
  Filled 2015-01-04 (×2): qty 2

## 2015-01-04 MED ORDER — HYDROCODONE-ACETAMINOPHEN 5-325 MG PO TABS
1.0000 | ORAL_TABLET | Freq: Four times a day (QID) | ORAL | Status: DC | PRN
Start: 2015-01-04 — End: 2015-01-07
  Administered 2015-01-04 (×3): 1 via ORAL
  Filled 2015-01-04 (×3): qty 1

## 2015-01-04 NOTE — Care Management Note (Addendum)
    Page 1 of 1   01/05/2015     11:31:17 AM CARE MANAGEMENT NOTE 01/05/2015  Patient:  [name],[name]   Account Number:  [id number]  Date Initiated:  01/04/2015  Documentation initiated by:  Junius CreamerWELL,DEBBIE  Subjective/Objective Assessment:   adm w aortic dissection     Action/Plan:   lives w fam   Anticipated DC Date:     Anticipated DC Plan:  HOME W HOME HEALTH SERVICES      DC Planning Services  CM consult  [facility]      Choice offered to / List presented to:             Status of service:   Medicare Important Message given?   (If response is "NO", the following Medicare IM given date fields will be blank) Date Medicare IM given:   Medicare IM given by:   Date Additional Medicare IM given:   Additional Medicare IM given by:    Discharge Disposition:    Per UR Regulation:  Reviewed for med. necessity/level of care/duration of stay  If discussed at Long Length of Stay Meetings, dates discussed:    Comments:  4/26 1129a debbie Kinza Gouveia rn,bsn left pt inform on [facility] and brochure on [place] [facility].

## 2015-01-04 NOTE — Progress Notes (Signed)
Echocardiogram 2D Echocardiogram has been performed.  Dorothey BasemanReel, Necie Wilcoxson M 01/04/2015, 1:36 PM

## 2015-01-04 NOTE — Progress Notes (Addendum)
   [facility] [place]  Subjective  - Feeling a little better today.   Objective 143/60 84 99.2 F (37.3 C) (Oral) 15 93%  Intake/Output Summary (Last 24 hours) at 01/04/15 0946 Last data filed at 01/04/15 0800  Gross per 24 hour  Intake 4039.6 ml  Output   1850 ml  Net 2189.6 ml    Palpable DP/PT 2+ pulses Abdomin soft NTTP Lungs non labored breathing    Assessment/Planning: Likely chronic Type B aortic dissection without malperfusion, resolving pancreatitis  Strict BP control with target 120/80.  Life style changes of healthy eating and starting a walking program for exercise.    Clinton GallantCOLLINS, EMMA [facility] Hospital[facility] 01/04/2015 9:46 AM --  Laboratory Lab Results:  Recent Labs  01/02/15 0850 01/03/15 0345  WBC 11.1* 10.2  HGB 15.7 15.0  HCT 47.3 46.4  PLT 235 222   BMET  Recent Labs  01/03/15 0345 01/04/15 0238  NA 136 130*  K 4.2 3.8  CL 106 101  CO2 19 17*  GLUCOSE 82 115*  BUN 9 9  CREATININE 0.84 0.86  CALCIUM 8.3* 7.6*    COAG No results found for: INR, PROTIME No results found for: PTT   Addendum  I have independently interviewed and examined the patient, and I agree with the physician assistant's findings.  Continued no evidence of malperfusion.  Nothing more to add to his management.  Strict BP control with target <130/90.  Avoid ilicit drug use.  Suspect his initial Ao Dx occurred during a period of cocaine use which spiked his BP resulting in the tear.  Will need longitudinal follow-up.  Follow up in my office in 6 month.  Repeat CTA in 12 months.  Leonides SakeBrian Garrick Midgley, MD [facility] [place] Office: [phone] Pager: [phone]  01/04/15 1000

## 2015-01-04 NOTE — Progress Notes (Signed)
PULMONARY / CRITICAL CARE MEDICINE   Name: Frank PavlovDwayne A Burgin MRN: [id number] DOB: 03/27/1961    ADMISSION DATE:  01/02/2015 CONSULTATION DATE:  01/02/15  REFERRING MD :  Dr. Jodi MourningZavitz  CHIEF COMPLAINT:  Aortic Dissection / HTN   Brief HPI: 54 y/o AAM with PMH of HTN (not treated) who presented to [facility] ER on 4/23 with complaints of abdominal / chest pain & pressure.  ER work up consistent with an aortic dissection (extending from the thoracic aorta through the abdominal aorta terminating at the iliac vessels) and hypertension.  PCCM called for ICU admission.   STUDIES:  4/23  CT Abd/Pelvis >> aortic dissection extending from the thoracic aorta through the abdominal aorta terminating at the iliac vessels, otherwise negative  4/23  CTA Chest, Abd/Pelvis >> aortic dissection beginning in the thorax, extending throughout the abdominal aorta to the L common iliac artery.  The dissection begins between the takeoff of the left common carotid artery and the L subclavian artery with extension into the L subclavian artery - characterized as a Type B dissection, no vital flow compromise  4/23 2D echo >>>pending   SIGNIFICANT EVENTS: 4/23  Admit with hypertension, chest/abd pain in setting of aortic dissection, UDS+cocaine   SUBJECTIVE:   Afebrile Tolerating regular diet well  VITAL SIGNS: Temp:  [98 F (36.7 C)-99.2 F (37.3 C)] 99.2 F (37.3 C) (04/25 0745) Pulse Rate:  [71-89] 84 (04/25 0806) Resp:  [10-23] 15 (04/25 0806) BP: (92-164)/(41-81) 143/60 mmHg (04/25 0806) SpO2:  [88 %-97 %] 93 % (04/25 0806) Weight:  [257 lb 4.4 oz (116.7 kg)-261 lb 0.4 oz (118.4 kg)] 261 lb 0.4 oz (118.4 kg) (04/25 0500)      INTAKE / OUTPUT:  Intake/Output Summary (Last 24 hours) at 01/04/15 0838 Last data filed at 01/04/15 0800  Gross per 24 hour  Intake 4332.65 ml  Output   1850 ml  Net 2482.65 ml   PHYSICAL EXAMINATION: General: Resting in bed, in NAD Neuro:  AAOx4, speech clear, MAE  HEENT:  Mm  pink/moist Cardiovascular:  s1s2 rrr, no m/r/g Lungs:  CTAB, no respiratory distress Abdomen:  Obese / soft, bsx4 active  Musculoskeletal:  No acute deformities, no edema, no cyanosis Skin:  Warm/dry, no rash noted  LABS:  CBC  Recent Labs Lab 01/02/15 0850 01/03/15 0345  WBC 11.1* 10.2  HGB 15.7 15.0  HCT 47.3 46.4  PLT 235 222   Coag's No results for input(s): APTT, INR in the last 168 hours.   BMET  Recent Labs Lab 01/02/15 0850 01/03/15 0345 01/04/15 0238  NA 135 136 130*  K 4.4 4.2 3.8  CL 102 106 101  CO2 23 19 17*  BUN 12 9 9   CREATININE 1.02 0.84 0.86  GLUCOSE 100* 82 115*   Electrolytes  Recent Labs Lab 01/02/15 0850 01/03/15 0345 01/04/15 0238  CALCIUM 9.1 8.3* 7.6*  MG  --   --  1.8  PHOS  --   --  2.6   Sepsis Markers  Recent Labs Lab 01/02/15 1625 01/02/15 1718 01/03/15 0345 01/04/15 0238  LATICACIDVEN  --  2.2*  --   --   PROCALCITON <0.10  --  <0.10 <0.10     ABG No results for input(s): PHART, PCO2ART, PO2ART in the last 168 hours.   Liver Enzymes  Recent Labs Lab 01/02/15 0850  AST 25  ALT 18  ALKPHOS 101  BILITOT 1.0  ALBUMIN 3.8   Cardiac Enzymes  Recent Labs Lab 01/02/15 1718  01/02/15 2328 01/03/15 0825  TROPONINI <0.03 <0.03 <0.03   Glucose No results for input(s): GLUCAP in the last 168 hours.  Imaging Dg Chest Port 1 View  01/03/2015   CLINICAL DATA:  Chest pain  EXAM: PORTABLE CHEST - 1 VIEW  COMPARISON:  01/02/2015  FINDINGS: The heart size and mediastinal contours are within normal limits. Both lungs are clear with hypo aerated with thin curvilinear left lower lobe scarring or atelectasis. Cardiac leads are in place obscuring detail. The visualized skeletal structures are unremarkable.  IMPRESSION: No active disease.   Electronically Signed   By: Christiana Pellant M.D.   On: 01/03/2015 09:28     ASSESSMENT / PLAN:  PULMONARY OETT A: No acute issues  Tobacco Abuse  P:   Pt counseled on smoking  cessation. Oxygen as needed to support sats > 90%. Anticipate will need an ambulatory desaturation study prior to discharge, anticipate will need home O2.  CARDIOVASCULAR CVL A:  Type B Aortic Dissection - appears w chronic dissection, trop neg x3.   HTN - not on therapy prior to admit  Cocaine + per UDS P:  Vascular Surgery following, no indication. Off esmolol. On cardene drip, wean off as tolerated, SBP goal <120 Will add norvasc today for BP control as he remains on high dose of cardene. F/u on ECHO ordered on 4/23 Continue losartan  daily Increased cardizem to  PO q6h to wean off cardene drip I relayed to patient the extreme (life or death) importance of tight BP control, he has a BP cuff device at home and will be using.  RENAL A:   No acute issues. Mg and K borderline P:   Trend BMP  Replace electrolytes as indicated  Lasix 40 mg IV q8 x2 doses. Replace K preemptively.  GASTROINTESTINAL A:   Abdominal Pain  N/V - x1 episode 4/22 pm  Elevated Lipase / Pancreatitis -Resolved P:   Pepcid  Heart healthy diet.  HEMATOLOGIC A:   No acute issues.  P:  Trend CBC  Heparin for DVT prophylaxis   INFECTIOUS A:   Pancreatitis - in setting of EtOH intake. Lipase normal, pro calcitonin <10 Leucocytosis - mild, likely distress demargination. Resolved P:   Monitor for abd pain  ENDOCRINE A:   Mild Hyperglycemia  P:   F/u Hgb A1c  NEUROLOGIC A:   Pain  Hx ETOH, Polysubstance (occasional) usage Cocaine positive on UDS P:   Vicodin PRN for severe pain  Patient counseled regarding polysubstance use and risks with HTN / dissection etc CSW consult for counseling  FAMILY  - Updates: Patient updated on 4/25  Summary: 74 yr man with type B chronic AAA, no PCP, polysubstance abuse (EtOH, cocaine, tobacco), presenting with abd pain with mild pancreatitis. Epigastric pain nearly resolved, tolerating HH diet well. Per Vascular Surgery, AAA to be medically  managed at this time with tight BP control. Off esmolol but still on cardene drip despite cardizem initiation last night. Will increase cardizem to wean off cardene drip (BP should also improve as cocaine wears off).  Will likely be transferred to Med Surg on 4/26.   Ky Barban, MD IMTS, PGY3 01/04/2015, 8:38 AM   Above noted edited in full, patient complains of swelling this AM, receiving 150 ml/hr of cardene for BP control and BP remains in the 150's systolic.  Will add low dose norvasc and hopefully be able to get off cardene over the next day or so.  In the meantime, titrate as  ordered, monitor BP closely and begin ambulation.  Suspect will need O2 with ambulation, once proves able to ambulate will likely be able to order the ambulatory desat study.  The patient is critically ill with multiple organ systems failure and requires high complexity decision making for assessment and support, frequent evaluation and titration of therapies, application of advanced monitoring technologies and extensive interpretation of multiple databases.   Critical Care Time devoted to patient care services described in this note is  35  Minutes. This time reflects time of care of this signee Dr Koren Bound. This critical care time does not reflect procedure time, or teaching time or supervisory time of PA/NP/Med student/Med Resident etc but could involve care discussion time.  Alyson Reedy, M.D. [facility] Pulmonary/Critical Care Medicine. Pager: [phone]. After hours pager: [phone].

## 2015-01-05 LAB — BASIC METABOLIC PANEL
ANION GAP: 9 (ref 5–15)
BUN: 10 mg/dL (ref 6–23)
CO2: 22 mmol/L (ref 19–32)
Calcium: 8.2 mg/dL — ABNORMAL LOW (ref 8.4–10.5)
Chloride: 104 mmol/L (ref 96–112)
Creatinine, Ser: 0.79 mg/dL (ref 0.50–1.35)
GFR calc Af Amer: >90 mL/min (ref >90)
GFR calc non Af Amer: >90 mL/min (ref >90)
Glucose, Bld: 121 mg/dL — ABNORMAL HIGH (ref 70–99)
Potassium: 3.6 mmol/L (ref 3.5–5.1)
Sodium: 135 mmol/L (ref 135–145)

## 2015-01-05 LAB — CBC
HCT: 42.7 % (ref 39.0–52.0)
Hemoglobin: 13.9 g/dL (ref 13.0–17.0)
MCH: 25.4 pg — ABNORMAL LOW (ref 26.0–34.0)
MCHC: 32.6 g/dL (ref 30.0–36.0)
MCV: 78.1 fL (ref 78.0–100.0)
Platelets: 233 10*3/uL (ref 150–400)
RBC: 5.47 MIL/uL (ref 4.22–5.81)
RDW: 13.9 % (ref 11.5–15.5)
WBC: 10.3 10*3/uL (ref 4.0–10.5)

## 2015-01-05 LAB — MAGNESIUM: Magnesium: 2 mg/dL (ref 1.5–2.5)

## 2015-01-05 LAB — PHOSPHORUS: Phosphorus: 2.8 mg/dL (ref 2.3–4.6)

## 2015-01-05 MED ORDER — LOSARTAN POTASSIUM 50 MG PO TABS
50.0000 mg | ORAL_TABLET | Freq: Every day | ORAL | Status: DC
  Administered 2015-01-06 – 2015-01-07 (×2): 50 mg via ORAL
  Filled 2015-01-05 (×2): qty 1

## 2015-01-05 MED ORDER — CLONIDINE HCL 0.2 MG/24HR TD PTWK: 0.2000 mg | MEDICATED_PATCH | TRANSDERMAL | Status: DC

## 2015-01-05 MED ORDER — LOSARTAN POTASSIUM 50 MG PO TABS: 100.0000 mg | ORAL_TABLET | Freq: Every day | ORAL | Status: DC

## 2015-01-05 MED ORDER — AMLODIPINE BESYLATE 10 MG PO TABS
10.0000 mg | ORAL_TABLET | Freq: Every day | ORAL | Status: DC
  Administered 2015-01-06 – 2015-01-07 (×2): 10 mg via ORAL
  Filled 2015-01-05 (×2): qty 1

## 2015-01-05 MED ORDER — ZOLPIDEM TARTRATE 5 MG PO TABS
5.0000 mg | ORAL_TABLET | Freq: Once | ORAL | Status: AC
  Administered 2015-01-05: 5 mg via ORAL
  Filled 2015-01-05: qty 1

## 2015-01-05 MED ORDER — DILTIAZEM HCL 90 MG PO TABS
90.0000 mg | ORAL_TABLET | Freq: Four times a day (QID) | ORAL | Status: AC
  Administered 2015-01-05 – 2015-01-06 (×6): 90 mg via ORAL
  Filled 2015-01-05 (×9): qty 1

## 2015-01-05 MED ORDER — FUROSEMIDE 10 MG/ML IJ SOLN
40.0000 mg | Freq: Three times a day (TID) | INTRAMUSCULAR | Status: AC
  Administered 2015-01-05 (×2): 40 mg via INTRAVENOUS
  Filled 2015-01-05 (×2): qty 4

## 2015-01-05 MED ORDER — POTASSIUM CHLORIDE CRYS ER 20 MEQ PO TBCR
40.0000 meq | EXTENDED_RELEASE_TABLET | Freq: Three times a day (TID) | ORAL | Status: AC
  Administered 2015-01-05 (×2): 40 meq via ORAL
  Filled 2015-01-05 (×2): qty 2

## 2015-01-05 MED ORDER — AMLODIPINE BESYLATE 5 MG PO TABS
5.0000 mg | ORAL_TABLET | Freq: Once | ORAL | Status: AC
  Administered 2015-01-05: 5 mg via ORAL
  Filled 2015-01-05: qty 1

## 2015-01-05 NOTE — Evaluation (Addendum)
Physical Therapy Evaluation/ Discharge Patient Details Name: [name] MRN: [id number] DOB: 01-10-1961 Today's Date: 01/05/2015   History of Present Illness  54 y/o AAM with PMH of HTN (not treated) who presented to [facility] ER on 4/23 with complaints of abdominal / chest pain & pressure. ER work up consistent with an aortic dissection (extending from the thoracic aorta through the abdominal aorta terminating at the iliac vessels) type B and hypertension  Clinical Impression  Pt very pleasant and moving well other than his prior difficulty with gait due to hip arthritis. Pt encouraged to use a cane for ease of gait but denied further intervention as he has been dealing with the pain for sometime and has limited strength and ROM , also recommended a hip kit for pt. Mobility and gait at baseline with HR 101 BP 129/68 supine and 120/56 with activity, 92-95% on RA. All education completed and no further needs at this time, will sign off with pt in agreement.    Follow Up Recommendations No PT follow up    Equipment Recommendations  Cane    Recommendations for Other Services       Precautions / Restrictions Precautions Precautions: Fall Precaution Comments: right hip OA      Mobility  Bed Mobility Overal bed mobility: Modified Independent                Transfers Overall transfer level: Modified independent               General transfer comment: cues for Right foot placement in front of pt to decrease pain with transfers  Ambulation/Gait Ambulation/Gait assistance: Modified independent (Device/Increase time) Ambulation Distance (Feet): 300 Feet Assistive device: None Gait Pattern/deviations: Step-through pattern;Antalgic;Decreased stance time - right   Gait velocity interpretation: at or above normal speed for age/gender General Gait Details: cued for benefit of cane and use  Stairs            Wheelchair Mobility    Modified Rankin (Stroke Patients  Only)       Balance                                             Pertinent Vitals/Pain Pain Assessment: 0-10 Pain Score: 4  Pain Location: right hip Pain Descriptors / Indicators: Aching Pain Intervention(s): Repositioned;Limited activity within patient's tolerance    Home Living Family/patient expects to be discharged to:: Private residence Living Arrangements: Parent Available Help at Discharge: Family;Available PRN/intermittently Type of Home: House Home Access: Stairs to enter   [employer] of Steps: 4 Home Layout: One level Home Equipment: None      Prior Function Level of Independence: Independent         Comments: pt works as an Clinical biochemist and 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 left hip for years with recent OA diagnosis     Hand Dominance        Extremity/Trunk Assessment   Upper Extremity Assessment: Overall WFL for tasks assessed           Lower Extremity Assessment: RLE deficits/detail RLE Deficits / Details: decreased ROM and strength due to painful hip    Cervical / Trunk Assessment: Normal  Communication   Communication: No difficulties  Cognition Arousal/Alertness: Awake/alert Behavior During Therapy: WFL for tasks assessed/performed Overall Cognitive Status: Within Functional Limits for tasks assessed  General Comments      Exercises        Assessment/Plan    PT Assessment Patent does not need any further PT services  PT Diagnosis Difficulty walking   PT Problem List    PT Treatment Interventions     PT Goals (Current goals can be found in the Care Plan section) Acute Rehab PT Goals PT Goal Formulation: All assessment and education complete, DC therapy    Frequency     Barriers to discharge        Co-evaluation               End of Session   Activity Tolerance: Patient tolerated treatment well Patient left: in chair;with call bell/phone within reach Nurse  Communication: Mobility status         Time: 1151-1208 PT Time Calculation (min) (ACUTE ONLY): 17 min   Charges:   PT Evaluation $Initial PT Evaluation Tier I: 1 Procedure     PT G CodesMelford Aase 01/05/2015, 12:22 PM Elwyn Reach, [place]

## 2015-01-05 NOTE — Progress Notes (Signed)
Patient is noncompliant with wearing oxygen . This nurse has replaced nasal cannula several times with education on the importance of the oxygen. Education also given in regards to his current medical management and importance of smoking cessation, not to taking illegal substances and blood pressure management. Patient stated " nobody is talking to me" . When asked to further explain, he stated " my mother has these questions regarding how will I need to be cared for when I leave". I directed him to the education material provided regarding hypertension, aortic aneurysm and smoking cessation. I again restated the importance of not smoking, taking his medications, and monitoring  his blood pressure.

## 2015-01-05 NOTE — Progress Notes (Signed)
PULMONARY / CRITICAL CARE MEDICINE   Name: Frank PavlovDwayne A Croy MRN: [id number] DOB: 03/12/1961    ADMISSION DATE:  01/02/2015 CONSULTATION DATE:  01/02/15  REFERRING MD :  Dr. Jodi MourningZavitz  CHIEF COMPLAINT:  Aortic Dissection / HTN   Brief HPI: 54 y/o AAM with PMH of HTN (not treated) who [facility]23 with complaints of abdominal / chest pain & pressure.  ER work up consistent with an aortic dissection (extending from the thoracic aorta through the abdominal aorta terminating at the iliac vessels) and hypertension.  PCCM called for ICU admission.   STUDIES:  4/23  CT Abd/Pelvis >> aortic dissection extending from the thoracic aorta through the abdominal aorta terminating at the iliac vessels, otherwise negative  4/23  CTA Chest, Abd/Pelvis >> aortic dissection beginning in the thorax, extending throughout the abdominal aorta to the L common iliac artery.  The dissection begins between the takeoff of the left common carotid artery and the L subclavian artery with extension into the L subclavian artery - characterized as a Type B dissection, no vital flow compromise  4/23 2D echo >>>EF 65-70%, trivial AV regurgitation, aortic root mildly dilated, LA mildly dilated, aortic arch with no obvious dissection.    SIGNIFICANT EVENTS: 4/23  Admit with hypertension, chest/abd pain in setting of aortic dissection, UDS+cocaine   SUBJECTIVE:   Afebrile Tolerating regular diet well  VITAL SIGNS: Temp:  [98.2 F (36.8 C)-99.1 F (37.3 C)] 99.1 F (37.3 C) (04/26 0800) Pulse Rate:  [76-105] 99 (04/26 0800) Resp:  [13-27] 22 (04/26 0800) BP: (102-163)/(42-130) 124/74 mmHg (04/26 0800) SpO2:  [84 %-100 %] 94 % (04/26 0800) Weight:  [259 lb 4.2 oz (117.6 kg)] 259 lb 4.2 oz (117.6 kg) (04/26 0500)      INTAKE / OUTPUT:  Intake/Output Summary (Last 24 hours) at 01/05/15 0951 Last data filed at 01/05/15 0800  Gross per 24 hour  Intake 1518.01 ml  Output   2875 ml  Net -1356.99 ml    PHYSICAL EXAMINATION: General: Resting in bed, in NAD Neuro:  AAOx4, speech clear, MAE  HEENT:  Mm pink/moist Cardiovascular:  s1s2 rrr, no m/r/g Lungs:  CTAB, no respiratory distress Abdomen:  Obese / soft, bsx4 active  Musculoskeletal:  No acute deformities, no edema, no cyanosis Skin:  Warm/dry, no rash noted  LABS:  CBC  Recent Labs Lab 01/02/15 0850 01/03/15 0345 01/05/15 0227  WBC 11.1* 10.2 10.3  HGB 15.7 15.0 13.9  HCT 47.3 46.4 42.7  PLT 235 222 233   Coag's No results for input(s): APTT, INR in the last 168 hours.   BMET  Recent Labs Lab 01/03/15 0345 01/04/15 0238 01/05/15 0227  NA 136 130* 135  K 4.2 3.8 3.6  CL 106 101 104  CO2 19 17* 22  BUN 9 9 10   CREATININE 0.84 0.86 0.79  GLUCOSE 82 115* 121*   Electrolytes  Recent Labs Lab 01/03/15 0345 01/04/15 0238 01/05/15 0227  CALCIUM 8.3* 7.6* 8.2*  MG  --  1.8 2.0  PHOS  --  2.6 2.8   Sepsis Markers  Recent Labs Lab 01/02/15 1625 01/02/15 1718 01/03/15 0345 01/04/15 0238  LATICACIDVEN  --  2.2*  --   --   PROCALCITON <0.10  --  <0.10 <0.10     ABG No results for input(s): PHART, PCO2ART, PO2ART in the last 168 hours.   Liver Enzymes  Recent Labs Lab 01/02/15 0850  AST 25  ALT 18  ALKPHOS 101  BILITOT 1.0  ALBUMIN 3.8   Cardiac Enzymes  Recent Labs Lab 01/02/15 1718 01/02/15 2328 01/03/15 0825  TROPONINI <0.03 <0.03 <0.03   Glucose No results for input(s): GLUCAP in the last 168 hours.  Imaging No results found.   ASSESSMENT / PLAN:  PULMONARY OETT A: No acute issues  Tobacco Abuse  P:   Pt counseled on smoking cessation. Oxygen as needed to support sats > 90%. Anticipate will need an ambulatory desaturation study prior to discharge, anticipate will not need home O2.  CARDIOVASCULAR CVL A:  Type B Aortic Dissection - appears w chronic dissection, trop neg x3.   HTN - not on therapy prior to admit  Cocaine + per UDS P:  Vascular Surgery  following, no indication for surgical intervention at this time. On cardene drip, wean off as tolerated, SBP goal <130 Will increase cardizem to  q6hr PO Will increase Norvasc to  PO daily Keep losartan at  daily Will consider clonidine patch though compliance w risk of rebound HTN may be an issue He was again reminded of the extreme (life or death) importance of tight BP control, he has a BP cuff device at home and will be using.  RENAL A:   No acute issues. Mg and K borderline. Had Lasix 40 mg IV q8 x2 doses on 4/25 with Replacement of K preemptively.  P:   Trend BMP  Replace electrolytes as indicated    GASTROINTESTINAL A:   Abdominal Pain  N/V - x1 episode 4/22 pm  Elevated Lipase / Pancreatitis -Resolved P:   Pepcid  Heart healthy diet.  HEMATOLOGIC A:   No acute issues.  P:  Trend CBC  Heparin for DVT prophylaxis   INFECTIOUS A:   Pancreatitis - in setting of EtOH intake. Lipase normal, pro calcitonin <10 Leucocytosis - mild, likely distress demargination. Resolved P:   Monitor for abd pain  ENDOCRINE A:   Mild Hyperglycemia - prediabetes - Hgb A1C of 6.1% in setting of obesity. Will need PCP care after dc with lifestyle modification. P:   Will need PCP referral at dc  NEUROLOGIC A:   Pain  Hx ETOH, Polysubstance (occasional) usage Cocaine positive on UDS P:   Vicodin PRN for severe pain  Patient counseled regarding polysubstance use and risks with HTN / dissection etc CSW consult for counseling  FAMILY  - Updates: Patient updated on 4/26  Summary: 57 yr man with type B chronic AAA, no PCP, polysubstance abuse (EtOH, cocaine, tobacco), presenting with abd pain with mild pancreatitis. Epigastric pain nearly resolved, tolerating HH diet well. Per Vascular Surgery, AAA to be medically managed at this time with tight BP control. Still on cardene drip despite cardizem, Norvasc, and Losartan. Will increase dose of cardizem and Norvasc in hopes to  wean off cardene drip.  Will likely be transferred to Med Surg on 4/27.   Ky Barban, MD IMTS, PGY3 01/05/2015, 9:51 AM   Remains on cardene drip with frequent titration, will increase norvasc to 10 mg daily and cardizem to 90 with holding parameters.  Will attempt to wean down.  Continue diureses.  I/O and will monitor in the ICU.  The patient is critically ill with multiple organ systems failure and requires high complexity decision making for assessment and support, frequent evaluation and titration of therapies, application of advanced monitoring technologies and extensive interpretation of multiple databases.   Critical Care Time devoted to patient care services described in this note is  35  Minutes.  This time reflects time of care of this signee Dr Jennet Maduro. This critical care time does not reflect procedure time, or teaching time or supervisory time of PA/NP/Med student/Med Resident etc but could involve care discussion time.  Rush Farmer, M.D. [facility] Pulmonary/Critical Care Medicine. Pager: [phone]. After hours pager: [phone].

## 2015-01-05 NOTE — Progress Notes (Signed)
eLink Physician-Brief Progress Note Patient Name: Frank PavlovDwayne A Girdler DOB: 09/29/1960 MRN: [id number]   Date of Service  01/05/2015  HPI/Events of Note  Patient requests sleeping aid.  eICU Interventions  Ambien 5 mg PO X 1 now.      Intervention Category Minor Interventions: Routine modifications to care plan (e.g. PRN medications for pain, fever)  Sommer,Steven Eugene 01/05/2015, 9:40 PM

## 2015-01-06 DIAGNOSIS — F1410 Cocaine abuse, uncomplicated: Secondary | ICD-10-CM

## 2015-01-06 LAB — CBC
HEMATOCRIT: 44.3 % (ref 39.0–52.0)
HEMOGLOBIN: 14.8 g/dL (ref 13.0–17.0)
MCH: 25.9 pg — ABNORMAL LOW (ref 26.0–34.0)
MCHC: 33.4 g/dL (ref 30.0–36.0)
MCV: 77.4 fL — AB (ref 78.0–100.0)
PLATELETS: 282 10*3/uL (ref 150–400)
RBC: 5.72 MIL/uL (ref 4.22–5.81)
RDW: 14.1 % (ref 11.5–15.5)
WBC: 11.5 10*3/uL — AB (ref 4.0–10.5)

## 2015-01-06 LAB — BASIC METABOLIC PANEL
ANION GAP: 12 (ref 5–15)
BUN: 12 mg/dL (ref 6–23)
CALCIUM: 8.6 mg/dL (ref 8.4–10.5)
CO2: 21 mmol/L (ref 19–32)
CREATININE: 0.82 mg/dL (ref 0.50–1.35)
Chloride: 103 mmol/L (ref 96–112)
GFR calc Af Amer: >90 mL/min (ref >90)
Glucose, Bld: 114 mg/dL — ABNORMAL HIGH (ref 70–99)
Potassium: 4.1 mmol/L (ref 3.5–5.1)
Sodium: 136 mmol/L (ref 135–145)

## 2015-01-06 LAB — MAGNESIUM: MAGNESIUM: 2.1 mg/dL (ref 1.5–2.5)

## 2015-01-06 LAB — PHOSPHORUS: PHOSPHORUS: 3.5 mg/dL (ref 2.3–4.6)

## 2015-01-06 MED ORDER — DILTIAZEM HCL ER COATED BEADS 360 MG PO CP24: 360.0000 mg | ORAL_CAPSULE | Freq: Every day | ORAL | Status: DC

## 2015-01-06 MED ORDER — HYDROCHLOROTHIAZIDE 12.5 MG PO CAPS
12.5000 mg | ORAL_CAPSULE | Freq: Every day | ORAL | Status: DC
  Administered 2015-01-06: 12.5 mg via ORAL
  Filled 2015-01-06 (×2): qty 1

## 2015-01-06 NOTE — Progress Notes (Signed)
Patient received from 2H09 @ 1827. Assessment as documented.

## 2015-01-06 NOTE — Plan of Care (Signed)
Problem: Food- and Nutrition-Related Knowledge Deficit (NB-1.1) Goal: Nutrition education Formal process to instruct or train a patient/client in a skill or to impart knowledge to help patients/clients voluntarily manage or modify food choices and eating behavior to maintain or improve health. Outcome: Completed/Met Date Met:  01/06/15 Nutrition Education Note  RD consulted for nutrition education regarding a Heart Healthy diet.   Pt seemed to tired and did not make eye contact all the time. Mom at bedside and participated in education. Pt lives with mom and they share cooking responsibilities although mom reports she does most of the cooking. Emphasized label reading.    RD provided "Heart Healthy Nutrition Therapy" handout from the [employer]. Reviewed patient's dietary recall. Provided examples on ways to decrease sodium and fat intake in diet. Discouraged intake of processed foods and use of salt shaker. Encouraged fresh fruits and vegetables as well as whole grain sources of carbohydrates to maximize fiber intake. Teach back method used.  Expect fair compliance.  Body mass index is 39.56 kg/(m^2). Pt meets criteria for obesity class II based on current BMI.  Current diet order is Heart Healthy, patient is consuming approximately >50% of meals at this time. Labs and medications reviewed. No further nutrition interventions warranted at this time. RD contact information provided. If additional nutrition issues arise, please re-consult RD.  [place], [place], [place] Pager [phone] After Hours Pager

## 2015-01-06 NOTE — Progress Notes (Signed)
[place] TEAM 1 - Stepdown/ICU TEAM Progress Note  [name]:[id number] DOB: [birth year] DOA: 01/02/2015 PCP: No primary care provider on file.  Admit HPI / Brief Narrative: 54 y/o M with Hx of HTN (not treated) who presented to [facility] ER on 4/23 with complaints of abdominal / chest pain & pressure. ER work up consistent with an aortic dissection (extending from the thoracic aorta through the abdominal aorta terminating at the iliac vessels) and hypertension. PCCM attended to ICU admission.   HPI/Subjective: The patient is resting comfortably in bed.  He denies chest pain fevers chills nausea vomiting or abdominal pain.  He has many questions about his diagnosis and future treatment, all of which I have taken the time to answer.  He has been counseled to avoid cocaine, alcohol, and any other illicit drug.  He has been counseled extensively as to the absolute need for close medical follow-up and very strict blood pressure control.  Assessment/Plan:  Type B Aortic Dissection - appears w chronic dissection Vasc Surgery has evaluated - no indication for surgery presently - strict BP control is the tx with target systolic blood pressure of less than 130 and diastolic blood pressure of less than 90 - patient to follow-up in vascular surgery office in 6 months with Dr. Imogene Burn with plan for repeat CTA in 12 months - he will need to be set up with a PCP for frequent blood pressure monitoring prior to his discharge   Malignant HTN - HTN emergency Blood pressure control improved but not yet at goal - titrate medication again today and follow trend - he will need to be set up with a PCP for frequent blood pressure monitoring prior to his discharge   Elevated Lipase / Pancreatitis Resolved - due to alcohol - patient has been advised to avoid all alcohol from this point forward  Mild Hyperglycemia - prediabetes A1C 6.1 in setting of obesity  Obesity - Body mass index is 39.56  kg/(m^2).  Polysubstance abuse (cocaine, tobacco, EtOH) Patient has been advised of the connection between cocaine abuse, tobacco abuse, alcohol abuse, and his current critical illness - he has been advised to avoid/discontinue all of these substances and educated the failure to do so will lead to severe debility or even death  Code Status: FULL Family Communication: no family present at time of exam Disposition Plan: Transfer to medical bed - continue to titrate blood pressure medications - arrange for outpatient follow-up with specific appointment needed prior to discharge - possible discharge in 24 hours if blood pressure stable/cooperates  Consultants: PCCM Vasc Surgery  Procedures: TTE - 4/25 - severe LVH - EF 65-70% - no WMA - restrictive physiology   Antibiotics: none  DVT prophylaxis: SCDs  Objective: Blood pressure 148/76, pulse 105, temperature 98.4 F (36.9 C), temperature source Oral, resp. rate 24, height  (1.702 m), weight 114.6 kg (252 lb 10.4 oz), SpO2 96 %.  Intake/Output Summary (Last 24 hours) at 01/06/15 1055 Last data filed at 01/06/15 0800  Gross per 24 hour  Intake    810 ml  Output   2375 ml  Net  -1565 ml   Exam: General: No acute respiratory distress Lungs: Clear to auscultation bilaterally without wheezes or crackles Cardiovascular: Regular rate and rhythm without murmur gallop or rub normal S1 and S2 Abdomen: Nontender, overweight, soft, bowel sounds positive, no rebound, no ascites, no appreciable mass Extremities: No significant cyanosis, clubbing, or edema bilateral lower extremities  Data Reviewed: Basic Metabolic  Panel:  Recent Labs Lab 01/02/15 0850 01/03/15 0345 01/04/15 0238 01/05/15 0227 01/06/15 0251  NA 135 136 130* 135 136  K 4.4 4.2 3.8 3.6 4.1  CL 102 106 101 104 103  CO2 23 19 17* 22 21  GLUCOSE 100* 82 115* 121* 114*  BUN 12 9 9 10 12   CREATININE 1.02 0.84 0.86 0.79 0.82  CALCIUM 9.1 8.3* 7.6* 8.2* 8.6  MG  --    --  1.8 2.0 2.1  PHOS  --   --  2.6 2.8 3.5    Liver Function Tests:  Recent Labs Lab 01/02/15 0850  AST 25  ALT 18  ALKPHOS 101  BILITOT 1.0  PROT 7.0  ALBUMIN 3.8    Recent Labs Lab 01/02/15 0850 01/02/15 1625 01/03/15 0345  LIPASE 776*  --  30  AMYLASE  --  206* 102    CBC:  Recent Labs Lab 01/02/15 0850 01/03/15 0345 01/05/15 0227 01/06/15 0251  WBC 11.1* 10.2 10.3 11.5*  NEUTROABS 7.4  --   --   --   HGB 15.7 15.0 13.9 14.8  HCT 47.3 46.4 42.7 44.3  MCV 79.8 80.1 78.1 77.4*  PLT 235 222 233 282    Cardiac Enzymes:  Recent Labs Lab 01/02/15 0850 01/02/15 1718 01/02/15 2328 01/03/15 0825  TROPONINI <0.03 <0.03 <0.03 <0.03    Recent Results (from the past 240 hour(s))  MRSA PCR Screening     Status: None   Collection Time: 01/02/15  3:19 PM  Result Value Ref Range Status   MRSA by PCR NEGATIVE NEGATIVE Final    Comment:        The GeneXpert MRSA Assay (FDA approved for NASAL specimens only), is one component of a comprehensive MRSA colonization surveillance program. It is not intended to diagnose MRSA infection nor to guide or monitor treatment for MRSA infections.      Studies:   Recent x-ray studies have been reviewed in detail by the Attending Physician  Scheduled Meds:  Scheduled Meds: . amLODipine  10 mg Oral Daily  . diltiazem  90 mg Oral 4 times per day  . docusate sodium  100 mg Oral BID  . famotidine  20 mg Oral Daily  . losartan  50 mg Oral Daily    Time spent on care of this patient: 35 mins   Clemente Dewey T , MD   [facility] Office  [phone] Pager - Text Page per Loretha StaplerAmion as per below:  On-Call/Text Page:      [email or web address]      password [email or web address]  If 7PM-7AM, please contact night-coverage [email or web address] Password [email or web address] 01/06/2015, 10:55 AM   LOS: 4 days

## 2015-01-07 ENCOUNTER — Encounter (HOSPITAL_COMMUNITY): Payer: Self-pay | Admitting: Internal Medicine

## 2015-01-07 DIAGNOSIS — F1010 Alcohol abuse, uncomplicated: Secondary | ICD-10-CM

## 2015-01-07 DIAGNOSIS — I10 Essential (primary) hypertension: Secondary | ICD-10-CM | POA: Diagnosis present

## 2015-01-07 DIAGNOSIS — F1410 Cocaine abuse, uncomplicated: Secondary | ICD-10-CM | POA: Diagnosis present

## 2015-01-07 DIAGNOSIS — K852 Alcohol induced acute pancreatitis without necrosis or infection: Secondary | ICD-10-CM | POA: Diagnosis present

## 2015-01-07 DIAGNOSIS — Z720 Tobacco use: Secondary | ICD-10-CM | POA: Diagnosis present

## 2015-01-07 MED ORDER — AMLODIPINE BESYLATE 10 MG PO TABS: 10.0000 mg | ORAL_TABLET | Freq: Every day | ORAL | Status: DC

## 2015-01-07 MED ORDER — LOSARTAN POTASSIUM 50 MG PO TABS: 50.0000 mg | ORAL_TABLET | Freq: Every day | ORAL | Status: DC

## 2015-01-07 MED ORDER — HYDROCHLOROTHIAZIDE 50 MG PO TABS: 50.0000 mg | ORAL_TABLET | Freq: Every day | ORAL | Status: DC

## 2015-01-07 MED ORDER — BISACODYL 5 MG PO TBEC
10.0000 mg | DELAYED_RELEASE_TABLET | Freq: Once | ORAL | Status: AC
  Administered 2015-01-07: 10 mg via ORAL
  Filled 2015-01-07: qty 2

## 2015-01-07 MED ORDER — HYDROCHLOROTHIAZIDE 50 MG PO TABS
50.0000 mg | ORAL_TABLET | Freq: Every day | ORAL | Status: DC
  Administered 2015-01-07: 50 mg via ORAL
  Filled 2015-01-07: qty 1

## 2015-01-07 NOTE — Progress Notes (Signed)
Patient asked to speak with MD concerning weight restriction.  MD notified.  Patient left prior to MD calling.  [place]alled Nikki, CN who is taking patient out.  Per patient, MD did not call but he is OK to leave without answer to weight restriction.

## 2015-01-07 NOTE — Care Management (Signed)
CARE MANAGEMENT NOTE 01/07/2015  Patient:  [name],Frank A   Account Number:  [id number]  Date Initiated:  01/04/2015  Documentation initiated by:  Junius CreamerWELL,DEBBIE  Subjective/Objective Assessment:   adm w aortic dissection     Action/Plan:   lives w fam  01/07/2015 MATCH letter given [facility], pt informed and noted in d/c instructions.   Anticipated DC Date:  01/07/2015   Anticipated DC Plan:  HOME/SELF CARE      DC Planning Services  CM consult  [facility]      Choice offered to / List presented to:             Status of service:  Completed, signed off Medicare Important Message given?  NO (If response is "NO", the following Medicare IM given date fields will be blank) Date Medicare IM given:   Medicare IM given by:   Date Additional Medicare IM given:   Additional Medicare IM given by:    Discharge Disposition:  HOME/SELF CARE  Per UR Regulation:  Reviewed for med. necessity/level of care/duration of stay  If discussed at Long Length of Stay Meetings, dates discussed:    Comments:  01/07/2015 No HH needs identified, pt is ambulatory and has no skilled needs. Pt given MATCH letter for prescriptions, and [facility]. CRoyal RN MPH, case manager, [phone]   4/26 1129a debbie dowell rn,bsn left pt inform on [facility] and brochure on [place] [facility].

## 2015-01-07 NOTE — Progress Notes (Signed)
Discharge instructions and medications discussed with patient.  Prescriptions given to patient.  All questions answered.  

## 2015-01-07 NOTE — Progress Notes (Signed)
Patient requesting laxative. MD notified. 

## 2015-01-07 NOTE — Discharge Summary (Signed)
Tobacco abusePhysician Discharge Summary  [name] WUJ:[id number] DOB: 30-May-1961 DOA: 01/02/2015  PCP: No primary care provider on file.  Admit date: 01/02/2015 Discharge date: 01/07/2015  Time spent: 40 minutes  Recommendations for Outpatient Follow-up:   Type B Aortic Dissection - appears w chronic dissection -Vasc Surgery has evaluated - no indication for surgery presently - strict BP control is the tx with target SBP< 130 and DBP< 90  - patient to follow-up in vascular surgery office in 6 months with Dr. Leonides Sake with plan for repeat CTA in 12 months  - he will need to be set up with a PCP for frequent blood pressure monitoring prior to his discharge  -counseled patient at length that continuation of smoking, drinking, cocaine use would lead to his death.  Malignant HTN - HTN emergency -blood pressure now within goal target SBP< 130 and DBP< 90. -Discharge onamlodipine 10 mg daily -Discharge on hydrochlorothiazide 50 mg daily -discharged on losartan 50 mg daily -Follow-up with [facility] within one week, to establish care with PCP for frequent BP monitoring.  Elevated Lipase / Pancreatitis -Resolved - due to alcohol - patient has been advised to avoid all alcohol from this point forward  Mild Hyperglycemia - prediabetes -4/23 hemoglobin A1c = 6.1 in setting of obesity  Obesity - Body mass index is 39.56 kg/(m^2).  Polysubstance abuse (cocaine, tobacco, EtOH) -Patient has been advised of the connection between cocaine abuse, tobacco abuse, alcohol abuse, and his current critical illness - he has been advised to avoid/discontinue all of these substances and educated the failure to do so will lead to severe debility or even death    Discharge Diagnoses:  Active Problems:   Aortic dissection   Pancreatitis   Alcohol-induced acute pancreatitis   Aortic dissection, thoracic   Alcohol abuse   Cocaine abuse   Alcohol-induced  pancreatitis   Tobacco abuse   Malignant hypertension   Discharge Condition: Stable   Diet recommendation: Diabetic/heart healthy  Filed Weights   01/05/15 0500 01/06/15 0400 01/06/15 2100  Weight: 117.6 kg (259 lb 4.2 oz) 114.6 kg (252 lb 10.4 oz) 113.127 kg (249 lb 6.4 oz)    History of present illness:  54 y/o BM PMHx HTN (not treated),Alcohol abuse, alcohol induced pancreatitis,cocaine abuse,type B aortic dissection, Presented to [facility] ER on 4/23 with complaints of abdominal / chest pain & pressure. ER work up consistent with an aortic dissection (extending from the thoracic aorta through the abdominal aorta terminating at the iliac vessels) and hypertension. PCCM attended to ICU admission.  Patient was found to have hypertensive emergency -most likely secondary to his alcohol abuse, cocaine abuse, tobacco abuse. Patient was also found to have type the aortic dissection. During patient's hospitalization his BP was brought under control, and patient was counseled on the absolute need to discontinue use of cocaine, tobacco, alcohol.    Procedures: 4/25 echocardiogram;- Left ventricle:severe LVH. -LVEF= 65%-to 70%.   Consultations: Dr Alyson Reedy. (PCCM) Dr.Brian Rolena Infante (Vasc Surgery)    Antibiotics    Discharge Exam: Filed Vitals:   01/06/15 2102 01/07/15 0012 01/07/15 0500 01/07/15 1000  BP: 160/84 144/83 142/73 128/77  Pulse:  100 99 106  Temp:   98.4 F (36.9 C) 98.8 F (37.1 C)  TempSrc:   Oral Oral  Resp:   18 20  Height:      Weight:      SpO2:   95% 96%    General: A/O  4, NAD, negative acute respiratory distress. Cardiovascular: regular rhythm and rate, negative murmurs rubs or gallops, normal S1/S2 Respiratory: clear to auscultation bilateral  Discharge Instructions     Medication List    STOP taking these medications        cephALEXin 500 MG capsule  Commonly known as:  KEFLEX     HYDROcodone-acetaminophen 5-325 MG per tablet  Commonly  known as:  NORCO/VICODIN      TAKE these medications        amLODipine 10 MG tablet  Commonly known as:  NORVASC  Take 1 tablet (10 mg total) by mouth daily.     hydrochlorothiazide 50 MG tablet  Commonly known as:  HYDRODIURIL  Take 1 tablet (50 mg total) by mouth daily.     losartan 50 MG tablet  Commonly known as:  COZAAR  Take 1 tablet (50 mg total) by mouth daily.       No Known Allergies Follow-up Information    Follow up with  [facility]    . Schedule an appointment as soon as possible for a visit in 1 week.   Why:  Follow-up hospitalization for septic polysubstance abuse, aortic dissection, hypertensive urgency   Contact information:   [address] [place] [address] [phone]      Follow up with Nilda Simmer, MD. Schedule an appointment as soon as possible for a visit in 6 months.   Specialty:  Vascular Surgery   Why:  follow-up hospital discharge for type B aortic dissection   Contact information:   [address] [place] [address] [phone]        The results of significant diagnostics from this hospitalization (including imaging, microbiology, ancillary and laboratory) are listed below for reference.    Significant Diagnostic Studies: Dg Chest 2 View  01/02/2015   CLINICAL DATA:  Substernal chest pain since last night. Hx smoker  EXAM: CHEST  2 VIEW  COMPARISON:  None.  FINDINGS: The cardiac silhouette normal in size and configuration. Aorta is mildly uncoiled. No mediastinal or hilar masses or evidence of adenopathy.  Clear lungs.  No pleural effusion or pneumothorax.  Bony thorax is intact.  IMPRESSION: No active cardiopulmonary disease.   Electronically Signed   By: Amie Portland M.D.   On: 01/02/2015 09:25   Ct Abdomen Pelvis W Contrast  01/02/2015   CLINICAL DATA:  54 year old male with a history of abdominal pain.  EXAM: CT ABDOMEN AND PELVIS WITH CONTRAST  TECHNIQUE: Multidetector CT  imaging of the abdomen and pelvis was performed using the standard protocol following bolus administration of intravenous contrast.  CONTRAST:  OMNIPAQUE IOHEXOL 300 MG/ML  SOLN  COMPARISON:  04/12/2009  FINDINGS: Lower chest:  Unremarkable appearance of the superficial soft tissues of the chest wall.  Heart size within normal limits.  No pericardial fluid/ thickening.  Mild circumferential thickening of the distal esophagus. No lower mediastinal adenopathy.  Minimal atelectasis/ scarring at the lung bases. No confluent airspace disease or pleural effusion.  Abdomen/ pelvis:  Diffusely decreased attenuation/enhancement of the liver parenchyma compatible with steatosis. Otherwise unremarkable appearance of the liver. Unremarkable spleen. Unremarkable bilateral adrenal glands. Unremarkable appearance of the gallbladder. Unremarkable appearance of the pancreas.  Right kidney:  No evidence of hydronephrosis. Partially excreted contrast into the collecting system limits evaluation for kidney stones. The excretion is slightly asymmetric to the left which is delayed. Cortical phase does not appears asymmetric.  Left kidney:  No hydronephrosis. No  nephrolithiasis. No excretion of contrast into the collecting system the the cortical phase does not appear delayed.  No intra abdominal air or free fluid.  No abnormally distended small bowel or colon. No transition point. Normal appendix.  There are a few colonic diverticula without associated inflammatory changes.  Vascular:  Aortic dissection, extending from the thoracic aorta through the hiatus to the bifurcation. The prior study was performed without contrast. The diameter of the aorta at the aortic hiatus measures 3 cm in greatest transverse diameter on the current study, 2.5 cm on the prior.  At the lower thoracic aorta, there is partial thrombus within the left lateral lumen. Assessment of change of the flow lumen is not possible with no contrast-enhanced  comparison.  The patent flow lumen perfuses the celiac artery and superior mesenteric artery as well as the right renal artery.  Be low the aortic hiatus both flow channels are patent, with perfusion of the left renal artery from the lateral channel.  The dissection flap appears to terminate at the aortic bifurcation. Symmetric enhancement of the iliac vessels.  Scattered calcifications of the abdominal aorta. Greatest diameter just below the inferior mesenteric artery measures 2.8 cm. Greatest diameter on the comparison study measures 2.5 cm.  No periaortic fluid.  IMPRESSION: Aortic dissection, with the dissection flap extending from the thoracic aorta through the abdominal aorta, terminating at the iliac vessels. Chronicity is uncertain, as the comparison study was performed without contrast. The left lateral flow lumen is thrombosed in the lower chest, with patent flow within both the true and false lumen of the abdomen. The diameter of the aorta at the aortic hiatus has grown to 3 cm compared to the CT of 2010, when the diameter was 2.5 cm. Greatest distal diameter just below the IMA measures 2.8 cm, compared to a previous diameter of 2.5 cm. Further evaluation with thoracoabdominal CT angiogram may be considered for further evaluation if no other previous studies are available for comparison.  Flow preserved within all of the mesenteric vessels, including bilateral renal arteries. There appears to be slight delay of left renal artery perfusion, given that the excretion of the left kidney is delayed. Perfusion of the iliac arteries is symmetric.  Circumferential soft tissue thickening of the distal esophagus. Recommend correlation with a history of GERD and potentially upper endoscopy.  No other finding of the abdomen/pelvis to account for the patient's complaints of abdominal pain.  These results were called by telephone at the time of interpretation on 01/02/2015 at 11:27 am to Dr. Blane OharaJOSHUA ZAVITZ , who verbally  acknowledged these results.  Signed,  Yvone NeuJaime S. Loreta AveWagner, DO  [facility]  [facility]   Electronically Signed   By: Gilmer MorJaime  Wagner D.O.   On: 01/02/2015 11:28   Dg Chest Port 1 View  01/03/2015   CLINICAL DATA:  Chest pain  EXAM: PORTABLE CHEST - 1 VIEW  COMPARISON:  01/02/2015  FINDINGS: The heart size and mediastinal contours are within normal limits. Both lungs are clear with hypo aerated with thin curvilinear left lower lobe scarring or atelectasis. Cardiac leads are in place obscuring detail. The visualized skeletal structures are unremarkable.  IMPRESSION: No active disease.   Electronically Signed   By: Christiana PellantGretchen  Green M.D.   On: 01/03/2015 09:28   Dg Foot Complete Right  12/24/2014   CLINICAL DATA:  Pain and redness across metatarsals. No history of recent trauma  EXAM: RIGHT FOOT COMPLETE - 3+ VIEW  COMPARISON:  None.  FINDINGS: Frontal, oblique, and lateral views obtained. No fracture or dislocation. Joint spaces appear intact. No erosive change.  IMPRESSION: No fracture or dislocation.  No appreciable arthropathic change.   Electronically Signed   By: Bretta Bang III M.D.   On: 12/24/2014 12:08   Ct Angio Chest Aorta W/cm &/or Wo/cm  01/02/2015   CLINICAL DATA:  Aortic dissection  EXAM: CT ANGIOGRAPHY CHEST, ABDOMEN AND PELVIS  TECHNIQUE: Multidetector CT imaging through the chest, abdomen and pelvis was performed using the standard protocol during bolus administration of intravenous contrast. Multiplanar reconstructed images and MIPs were obtained and reviewed to evaluate the vascular anatomy.  CONTRAST:  OMNIPAQUE IOHEXOL 350 MG/ML SOLN  COMPARISON:  Earlier today  FINDINGS: CTA CHEST FINDINGS  Aortic dissection begins just proximal to the left subclavian artery. The superior extent of the dissection flap appears fenestrated. The superior extent of the false lumen is patent and extends into the left subclavian artery. Below the proximal  left subclavian artery, the false lumen is thrombosed to the level of the distal descending thoracic aorta. Below this point, both the false and true lumen opacified with contrast. The true lumen becomes narrowed by approximately 60% within the thorax.  There is no evidence of aortic dissection in the ascending aorta. The innominate artery and left common carotid artery are patent and originates from native aortic arch. Innominate artery, right common carotid artery, right subclavian artery, left common carotid artery are patent. Bilateral vertebral arteries are patent within the confines of the examination.  Three vessel coronary artery calcifications are noted. Atherosclerotic changes of the arch are present.  The dissection flap extends into the left subclavian artery. The dissection flap ends in the proximal left subclavian artery. The thoracic component extends into the abdomen. Both lumens opacified in the left subclavian artery.  No pneumothorax.  No pleural effusion.  Bibasilar atelectasis.  Review of the MIP images confirms the above findings.  CTA ABDOMEN AND PELVIS FINDINGS  The aortic dissection extends into the abdomen and into the left common iliac artery.  Celiac origin is from the true lumen. It is patent. Branch vessels are patent.  SMA origin is from the true lumen. It is patent. Branch vessels are patent. Replaced right hepatic artery anatomy.  Two right renal arteries from the true lumen are patent. Single left renal artery origin is also patent. The dissection appears fenestrated at the level of the left renal artery which may originate from both the false and true lumen. The left renal artery is patent.  IMA origin from the false lumen is patent. Branch vessels are grossly patent.  Right common, internal, and external iliac arteries are patent. The dissection extends into the proximal left common iliac artery then terminates above its bifurcation. Left external and internal iliac arteries are  patent. Dissection in the left common iliac artery does not appear flow limiting.  Diffuse hepatic steatosis.  Gallbladder, spleen, pancreas, and adrenal glands are within normal limits. Tiny hypodensity in the lower pole of the left kidney on image 186 is nonspecific.  Bladder and prostate are unremarkable.  Severe degenerative change of the right hip joint with juxta-articular cystic formation and sclerosis. Mild degenerative disc disease in the lower lumbar spine.  Review of the MIP images confirms the above findings.  IMPRESSION: There is an aortic dissection beginning in the thorax and extending throughout the abdominal aorta and to the left common iliac artery. The dissection begins between the takeoff of the left common carotid  artery and left subclavian artery with extension into the left subclavian artery. It is non flow limiting within the left subclavian artery. This dissection is best characterized as a type B dissection which typically originates beyond the left subclavian artery. The false lumen is patent at the left subclavian artery but then is thrombosed in the proximal half of the descending thoracic aorta. The false lumen is re- perfused in the distal thoracic aorta and throughout the abdomen. There is no evidence of flow compromise in the visceral vasculature or or left common iliac artery.   Electronically Signed   By: Jolaine Click M.D.   On: 01/02/2015 13:58   Dg Hip Unilat With Pelvis 2-3 Views Right  12/24/2014   CLINICAL DATA:  Worsening Pain right hip for 6 months.no injury POSTERIOR AND LATERAL RIGHT HIP PAIN  EXAM: RIGHT HIP (WITH PELVIS) 2-3 VIEWS  COMPARISON:  None.  FINDINGS: There are advanced arthropathic changes of the right hip with marked superior lateral joint space narrowing prominent superior acetabular and right femoral head subchondral cystic change and sclerosis as well as osteophytes from the base of the right femoral head and from the lateral margin of the superior  acetabulum. There is no acute fracture.  Left hip joint is normally spaced and aligned with no significant arthropathic change. SI joints and symphysis pubis are normally spaced and aligned.  Soft tissues are unremarkable.  IMPRESSION: 1. No acute fracture or dislocation. 2. Advanced arthropathic changes of the right hip, not evident on the left. Findings are consistent with osteoarthritis, which may be secondary to remote trauma or chronic avascular necrosis.   Electronically Signed   By: Amie Portland M.D.   On: 12/24/2014 11:55   Ct Angio Abd/pel W/ And/or W/o  01/02/2015   CLINICAL DATA:  Aortic dissection  EXAM: CT ANGIOGRAPHY CHEST, ABDOMEN AND PELVIS  TECHNIQUE: Multidetector CT imaging through the chest, abdomen and pelvis was performed using the standard protocol during bolus administration of intravenous contrast. Multiplanar reconstructed images and MIPs were obtained and reviewed to evaluate the vascular anatomy.  CONTRAST:  OMNIPAQUE IOHEXOL 350 MG/ML SOLN  COMPARISON:  Earlier today  FINDINGS: CTA CHEST FINDINGS  Aortic dissection begins just proximal to the left subclavian artery. The superior extent of the dissection flap appears fenestrated. The superior extent of the false lumen is patent and extends into the left subclavian artery. Below the proximal left subclavian artery, the false lumen is thrombosed to the level of the distal descending thoracic aorta. Below this point, both the false and true lumen opacified with contrast. The true lumen becomes narrowed by approximately 60% within the thorax.  There is no evidence of aortic dissection in the ascending aorta. The innominate artery and left common carotid artery are patent and originates from native aortic arch. Innominate artery, right common carotid artery, right subclavian artery, left common carotid artery are patent. Bilateral vertebral arteries are patent within the confines of the examination.  Three vessel coronary artery  calcifications are noted. Atherosclerotic changes of the arch are present.  The dissection flap extends into the left subclavian artery. The dissection flap ends in the proximal left subclavian artery. The thoracic component extends into the abdomen. Both lumens opacified in the left subclavian artery.  No pneumothorax.  No pleural effusion.  Bibasilar atelectasis.  Review of the MIP images confirms the above findings.  CTA ABDOMEN AND PELVIS FINDINGS  The aortic dissection extends into the abdomen and into the left common iliac artery.  Celiac origin is from the true lumen. It is patent. Branch vessels are patent.  SMA origin is from the true lumen. It is patent. Branch vessels are patent. Replaced right hepatic artery anatomy.  Two right renal arteries from the true lumen are patent. Single left renal artery origin is also patent. The dissection appears fenestrated at the level of the left renal artery which may originate from both the false and true lumen. The left renal artery is patent.  IMA origin from the false lumen is patent. Branch vessels are grossly patent.  Right common, internal, and external iliac arteries are patent. The dissection extends into the proximal left common iliac artery then terminates above its bifurcation. Left external and internal iliac arteries are patent. Dissection in the left common iliac artery does not appear flow limiting.  Diffuse hepatic steatosis.  Gallbladder, spleen, pancreas, and adrenal glands are within normal limits. Tiny hypodensity in the lower pole of the left kidney on image 186 is nonspecific.  Bladder and prostate are unremarkable.  Severe degenerative change of the right hip joint with juxta-articular cystic formation and sclerosis. Mild degenerative disc disease in the lower lumbar spine.  Review of the MIP images confirms the above findings.  IMPRESSION: There is an aortic dissection beginning in the thorax and extending throughout the abdominal aorta and to  the left common iliac artery. The dissection begins between the takeoff of the left common carotid artery and left subclavian artery with extension into the left subclavian artery. It is non flow limiting within the left subclavian artery. This dissection is best characterized as a type B dissection which typically originates beyond the left subclavian artery. The false lumen is patent at the left subclavian artery but then is thrombosed in the proximal half of the descending thoracic aorta. The false lumen is re- perfused in the distal thoracic aorta and throughout the abdomen. There is no evidence of flow compromise in the visceral vasculature or or left common iliac artery.   Electronically Signed   By: Jolaine Click M.D.   On: 01/02/2015 13:58    Microbiology: Recent Results (from the past 240 hour(s))  MRSA PCR Screening     Status: None   Collection Time: 01/02/15  3:19 PM  Result Value Ref Range Status   MRSA by PCR NEGATIVE NEGATIVE Final    Comment:        The GeneXpert MRSA Assay (FDA approved for NASAL specimens only), is one component of a comprehensive MRSA colonization surveillance program. It is not intended to diagnose MRSA infection nor to guide or monitor treatment for MRSA infections.      Labs: Basic Metabolic Panel:  Recent Labs Lab 01/02/15 0850 01/03/15 0345 01/04/15 0238 01/05/15 0227 01/06/15 0251  NA 135 136 130* 135 136  K 4.4 4.2 3.8 3.6 4.1  CL 102 106 101 104 103  CO2 23 19 17* 22 21  GLUCOSE 100* 82 115* 121* 114*  BUN 12 9 9 10 12   CREATININE 1.02 0.84 0.86 0.79 0.82  CALCIUM 9.1 8.3* 7.6* 8.2* 8.6  MG  --   --  1.8 2.0 2.1  PHOS  --   --  2.6 2.8 3.5   Liver Function Tests:  Recent Labs Lab 01/02/15 0850  AST 25  ALT 18  ALKPHOS 101  BILITOT 1.0  PROT 7.0  ALBUMIN 3.8    Recent Labs Lab 01/02/15 0850 01/02/15 1625 01/03/15 0345  LIPASE 776*  --  30  AMYLASE  --  206* 102   No results for input(s): AMMONIA in the last 168  hours. CBC:  Recent Labs Lab 01/02/15 0850 01/03/15 0345 01/05/15 0227 01/06/15 0251  WBC 11.1* 10.2 10.3 11.5*  NEUTROABS 7.4  --   --   --   HGB 15.7 15.0 13.9 14.8  HCT 47.3 46.4 42.7 44.3  MCV 79.8 80.1 78.1 77.4*  PLT 235 222 233 282   Cardiac Enzymes:  Recent Labs Lab 01/02/15 0850 01/02/15 1718 01/02/15 2328 01/03/15 0825  TROPONINI <0.03 <0.03 <0.03 <0.03   BNP: BNP (last 3 results) No results for input(s): BNP in the last 8760 hours.  ProBNP (last 3 results) No results for input(s): PROBNP in the last 8760 hours.  CBG: No results for input(s): GLUCAP in the last 168 hours.     Signed:  Carolyne Littles, MD [facility] [phone] pager

## 2015-01-12 ENCOUNTER — Ambulatory Visit: Payer: Self-pay | Attending: Family Medicine | Admitting: Family Medicine

## 2015-01-12 ENCOUNTER — Encounter: Payer: Self-pay | Admitting: Family Medicine

## 2015-01-12 VITALS — BP 125/77 | HR 99 | Temp 98.5°F | Resp 18 | Ht 69.0 in | Wt 247.0 lb

## 2015-01-12 DIAGNOSIS — K852 Alcohol induced acute pancreatitis without necrosis or infection: Secondary | ICD-10-CM

## 2015-01-12 DIAGNOSIS — R7309 Other abnormal glucose: Secondary | ICD-10-CM | POA: Insufficient documentation

## 2015-01-12 DIAGNOSIS — Z8639 Personal history of other endocrine, nutritional and metabolic disease: Secondary | ICD-10-CM | POA: Insufficient documentation

## 2015-01-12 DIAGNOSIS — R42 Dizziness and giddiness: Secondary | ICD-10-CM

## 2015-01-12 DIAGNOSIS — H8110 Benign paroxysmal vertigo, unspecified ear: Secondary | ICD-10-CM

## 2015-01-12 DIAGNOSIS — I7101 Dissection of thoracic aorta: Secondary | ICD-10-CM | POA: Insufficient documentation

## 2015-01-12 DIAGNOSIS — F17200 Nicotine dependence, unspecified, uncomplicated: Secondary | ICD-10-CM | POA: Insufficient documentation

## 2015-01-12 DIAGNOSIS — I7102 Dissection of abdominal aorta: Secondary | ICD-10-CM | POA: Insufficient documentation

## 2015-01-12 DIAGNOSIS — F1099 Alcohol use, unspecified with unspecified alcohol-induced disorder: Secondary | ICD-10-CM | POA: Insufficient documentation

## 2015-01-12 DIAGNOSIS — I7100 Dissection of unspecified site of aorta: Secondary | ICD-10-CM

## 2015-01-12 DIAGNOSIS — R7303 Prediabetes: Secondary | ICD-10-CM

## 2015-01-12 DIAGNOSIS — I71019 Dissection of thoracic aorta, unspecified: Secondary | ICD-10-CM

## 2015-01-12 DIAGNOSIS — I10 Essential (primary) hypertension: Secondary | ICD-10-CM | POA: Insufficient documentation

## 2015-01-12 LAB — T4, FREE: FREE T4: 1.14 ng/dL (ref 0.80–1.80)

## 2015-01-12 LAB — TSH: TSH: 3.707 u[IU]/mL (ref 0.350–4.500)

## 2015-01-12 LAB — T3, FREE: T3, Free: 2.8 pg/mL (ref 2.3–4.2)

## 2015-01-12 MED ORDER — MECLIZINE HCL 25 MG PO TABS: 25.0000 mg | ORAL_TABLET | Freq: Two times a day (BID) | ORAL | Status: DC | PRN

## 2015-01-12 NOTE — Progress Notes (Signed)
Subjective:    Patient ID: [name], male    DOB: 05/22/1961, 54 y.o.   MRN: [id number]  HPI  Admit Date: 01/02/15 Discharge Date: 01/07/15  [name]  Had presented to [facility] with chest pain and pressure radiating to his back as well as abdominal pain and was found to have an elevated blood pressures of 178 and 181 systolic; PMH was notable for untreated Hypertension and alcoholic pancreatitis.   CTA Chest, Abd/Pelvis revealed aortic dissection beginning in the thorax, extending throughout the abdominal aorta to the L common iliac artery. The dissection begins between the takeoff of the left common carotid artery and the L subclavian artery with extension into the L subclavian artery - characterized as a Type B dissection, no vital flow compromise.   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 Blood pressure log from home reviewed and valued range from 116/87 - 154/86.  Review of Systems  Constitutional: Negative for activity change and appetite change.  HENT: Negative for sinus pressure and sore throat.   Eyes: Negative for visual disturbance.  Respiratory: Negative for chest tightness and shortness of breath.   Cardiovascular: Negative for chest pain and palpitations.  Gastrointestinal: Negative for abdominal pain and abdominal distention.  Endocrine: Negative for cold intolerance, heat intolerance and polyphagia.       Excessive sweating  Genitourinary: Negative for dysuria,  frequency and difficulty urinating.  Musculoskeletal: Negative for back pain, joint swelling and arthralgias.  Skin: Negative for color change.  Neurological: Positive for light-headedness. Negative for dizziness, tremors and weakness.  Psychiatric/Behavioral: Negative for suicidal ideas and behavioral problems.         Objective:   Physical Exam  Constitutional: He is oriented to person, place, and time. He appears well-developed and well-nourished.  HENT:  Head: Normocephalic and atraumatic.  Right Ear: External ear normal.  Left Ear: External ear normal.  Eyes: Conjunctivae and EOM are normal. Pupils are equal, round, and reactive to light.  Neck: Normal range of motion. Neck supple. No tracheal deviation present.  Cardiovascular: Normal rate, regular rhythm and normal heart sounds.   No murmur heard. Pulmonary/Chest: Effort normal and breath sounds normal. No respiratory distress. He has no wheezes. He exhibits no tenderness.  Abdominal: Soft. Bowel sounds are normal. He exhibits no mass. There is no tenderness.  Musculoskeletal: Normal range of motion. He exhibits no edema or tenderness.  Neurological: He is alert and oriented to person, place, and time.  Skin: Skin is warm and dry.  Psychiatric: He has a normal mood and affect.         CLINICAL DATA: Aortic dissection  EXAM:  CT ANGIOGRAPHY CHEST, ABDOMEN AND PELVIS  TECHNIQUE:  Multidetector CT imaging through the chest, abdomen and pelvis was  performed using the standard protocol during bolus administration of  intravenous contrast. Multiplanar reconstructed images and MIPs were  obtained and reviewed to evaluate the vascular anatomy.  CONTRAST: 100mL OMNIPAQUE IOHEXOL 350 MG/ML SOLN  COMPARISON: Earlier today  FINDINGS:  CTA CHEST FINDINGS  Aortic dissection begins just proximal to the left subclavian  artery. The superior extent of the dissection flap appears  fenestrated. The superior extent of the false lumen  is patent and  extends into the left subclavian artery. Below the proximal left  subclavian artery, the false lumen is thrombosed to the level of the  distal descending thoracic aorta. Below this point, both the false  and true lumen opacified with contrast. The true lumen becomes  narrowed by approximately 60% within the thorax.  There is no evidence of aortic dissection in the ascending aorta.  The innominate artery and left common carotid artery are patent and  originates from native aortic arch. Innominate artery, right common  carotid artery, right subclavian artery, left common carotid artery  are patent. Bilateral vertebral arteries are patent within the  confines of the examination.  Three vessel coronary artery calcifications are noted.  Atherosclerotic changes of the arch are present.  The dissection flap extends into the left subclavian artery. The  dissection flap ends in the proximal left subclavian artery. The  thoracic component extends into the abdomen. Both lumens opacified  in the left subclavian artery.  No pneumothorax. No pleural effusion. Bibasilar atelectasis.  Review of the MIP images confirms the above findings.  CTA ABDOMEN AND PELVIS FINDINGS  The aortic dissection extends into the abdomen and into the left  common iliac artery.  Celiac origin is from the true lumen. It is patent. Branch vessels  are patent.  SMA origin is from the true lumen. It is patent. Branch vessels are  patent. Replaced right hepatic artery anatomy.  Two right renal arteries from the true lumen are patent. Single left  renal artery origin is also patent. The dissection appears  fenestrated at the level of the left renal artery which may  originate from both the false and true lumen. The left renal artery  is patent.  IMA origin from the false lumen is patent. Branch vessels are  grossly patent.  Right common, internal, and external iliac arteries are patent. The  dissection extends  into the proximal left common iliac artery then  terminates above its bifurcation. Left external and internal iliac  arteries are patent. Dissection in the left common iliac artery does  not appear flow limiting.  Diffuse hepatic steatosis.  Gallbladder, spleen, pancreas, and adrenal glands are within normal  limits. Tiny hypodensity in the lower pole of the left kidney on  image 186 is nonspecific.  Bladder and prostate are unremarkable.  Severe degenerative change of the right hip joint with  juxta-articular cystic formation and sclerosis. Mild degenerative  disc disease in the lower lumbar spine.  Review of the MIP images confirms the above findings.  IMPRESSION:  There is an aortic dissection beginning in the thorax and extending  throughout the abdominal aorta and to the left common iliac artery.  The dissection begins between the takeoff of the left common carotid  artery and left subclavian artery with extension into the left  subclavian artery. It is non flow limiting within the left  subclavian artery. This dissection is best characterized as a type B  dissection which typically originates beyond the left subclavian  artery. The false lumen is patent at the left subclavian artery but  then is thrombosed in the proximal half of the descending thoracic  aorta. The false lumen is re- perfused in the distal thoracic aorta  and throughout the abdomen.  There is no evidence of flow compromise  in the visceral vasculature or or left common iliac artery.  Electronically Signed  By: Jolaine Click M.D.  On: 01/02/2015 13:58             Assessment & Plan:  54 year old patient with a history of previously uncontrolled hypertension, recently managed for acute alcoholic pancreatitis, type B aortic dissection with medical management and no surgical intervention at this time.  Hypertension: Controlled Current symptoms are not established side effects of antihypertensives he is taking  and so i will evaluate for other possible etiologies and if work up is negative we might have to consider substituting and he would need to be evaluated at his next office visit for improvement in symptoms. Low-sodium diet and lifestyle modifications advised.  Type B aortic dissection: Goal blood pressure is < 130/90. Will need  to see vascular surgery in 6 months and will also need to repeat CTA at that time.  Vertigo: Placed on Meclizine and advised to change position slowly. Will send off thyroid labs due to excessive hyperhydrosis.  Pre Diabetes: ADA diet and lifestyle modification  Tobacco use disorder: Smoking cessation support: smoking cessation hotline: 1-800-QUIT-NOW.  Smoking cessation classes are available through [facility]. Call 9855185440 or visit our website at [email or web address].  Spent 3 minutes counseling on smoking cessation and patient is not ready to quit.   Disclaimer: This note was dictated with voice recognition software. Similar sounding words can inadvertently be transcribed and this note may contain transcription errors which may not have been corrected upon publication of note.

## 2015-01-12 NOTE — Progress Notes (Signed)
Patient hospitalized for abdominal pain, diagnosed with pancreatitis and aorta had tear on inside. Patient reports medication "has me messed up". Patient reports medication makes him sweat, makes patient feeling a little disoriented, weak, sleepy all the time. It's not constant, comes and goes. Patient reports pressure, wraps around from mid, upper abdomen around left side into back. Pressure with breathing. Patient reports dry mouth and thirsty all the time.

## 2015-01-12 NOTE — Patient Instructions (Signed)

## 2015-01-13 ENCOUNTER — Telehealth: Payer: Self-pay | Admitting: *Deleted

## 2015-01-13 NOTE — Telephone Encounter (Signed)
Patient asking for a letter stating that because of his medical condition he is not safe to work at this time.  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  Please advise.  Patient asks to be called when letter is complete.

## 2015-01-14 ENCOUNTER — Other Ambulatory Visit: Payer: Self-pay | Admitting: Family Medicine

## 2015-01-14 NOTE — Progress Notes (Unsigned)
Letter is ready for pick-up.

## 2015-01-14 NOTE — Progress Notes (Signed)
Quick Note:  Please inform the patient that labs are normal. Thank you. ______ 

## 2015-01-14 NOTE — Telephone Encounter (Signed)
Patient called again regarding work Physicist, medicalletter.  Informed him letter was ready for pick up.

## 2015-01-18 ENCOUNTER — Telehealth: Payer: Self-pay

## 2015-01-18 NOTE — Telephone Encounter (Signed)
-----   Message from Jaclyn ShaggyEnobong Amao, MD sent at 01/14/2015  5:42 PM EDT ----- Please inform the patient that labs are normal. Thank you.

## 2015-01-18 NOTE — Telephone Encounter (Signed)
I will reassess him at him at his office visit tomorrow.

## 2015-01-18 NOTE — Telephone Encounter (Signed)
Patient aware of lab results. Patient reports still feeling dizzy and sweating. "I feel like I'm in some kind of funk or something, I'm just really not myself". Patient reports BP still elevated. BP this morning 143/87 before BP meds. Patient has appointment tomorrow morning at 10am with Dr. Venetia NightAmao.

## 2015-01-19 ENCOUNTER — Ambulatory Visit: Payer: Self-pay | Attending: Family Medicine | Admitting: Family Medicine

## 2015-01-19 ENCOUNTER — Encounter: Payer: Self-pay | Admitting: Family Medicine

## 2015-01-19 VITALS — BP 136/84 | HR 117 | Temp 98.0°F | Resp 18 | Ht 69.0 in | Wt 246.0 lb

## 2015-01-19 DIAGNOSIS — R232 Flushing: Secondary | ICD-10-CM

## 2015-01-19 DIAGNOSIS — I7100 Dissection of unspecified site of aorta: Secondary | ICD-10-CM

## 2015-01-19 DIAGNOSIS — I7101 Dissection of thoracic aorta: Secondary | ICD-10-CM

## 2015-01-19 DIAGNOSIS — H8113 Benign paroxysmal vertigo, bilateral: Secondary | ICD-10-CM

## 2015-01-19 DIAGNOSIS — I71019 Dissection of thoracic aorta, unspecified: Secondary | ICD-10-CM

## 2015-01-19 MED ORDER — METOPROLOL TARTRATE 50 MG PO TABS: 50.0000 mg | ORAL_TABLET | Freq: Two times a day (BID) | ORAL | Status: DC

## 2015-01-19 NOTE — Patient Instructions (Signed)

## 2015-01-19 NOTE — Progress Notes (Signed)
Subjective:    Patient ID: [name], male    DOB: 10/30/1960, 54 y.o.   MRN: [id number]  HPI  [name] 54 year old patient with a history of previously uncontrolled hypertension, recently hospitalized for acute alcoholic pancreatitis, type B aortic dissection with medical management and no surgical intervention at this time.  He was commenced on amlodipine, hydrochlorothiazide, losartan and had complained he couldn't tolerate the medication and he was making him sweaty; he had also complained of dizziness for which he was diagnosed with vertigo and placed on meclizine. 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 Thyroid labs ordered at the last visit came back negative.  Since his last visit his vertigo has improved with the meclizine but he endorses feeling groggy and sleepy; he however continues to sweat a lot and feels flushed whenever he takes his antihypertensives and symptoms absent when he doesn't. He is unaware of which medication in particular is responsible as he takes three antihypertensives  Past Medical History  Diagnosis Date  . Malignant hypertension     Past Surgical History  Procedure Laterality Date  . Rectal abscess      Family History  Problem Relation Age of Onset  . Diabetes Father   . Heart disease Father   . Dementia Father     History   Social History  . Marital Status: Divorced    Spouse Name: N/A  . Number of Children: N/A  . Years of Education: N/A   Occupational History  . Not on file.   Social History Main Topics  . Smoking status: Current Some Day Smoker    Types: Cigarettes  . Smokeless tobacco: Not on file  . Alcohol Use: 0.0 oz/week    0 Standard drinks or equivalent per week     Comment: occasionally  . Drug Use: No     Comment: Patient denies marijuana and cociane use 01/19/15  . Sexual Activity: Yes   Other Topics Concern  . Not on file   Social History  Narrative    No Known Allergies  Current Outpatient Prescriptions on File Prior to Visit  Medication Sig Dispense Refill  . hydrochlorothiazide (HYDRODIURIL) 50 MG tablet Take 1 tablet (50 mg total) by mouth daily. 30 tablet 0  . losartan (COZAAR) 50 MG tablet Take 1 tablet (50 mg total) by mouth daily. 30 tablet 0  . meclizine (ANTIVERT) 25 MG tablet Take 1 tablet (25 mg total) by mouth 2 (two) times daily as needed. 60 tablet 1   No current facility-administered medications on file prior to visit.     Review of Systems  Constitutional: Negative for activity change and appetite change.  HENT: Negative for sinus pressure and sore throat.   Eyes: Negative for visual disturbance.  Respiratory: Negative for chest tightness and shortness of breath.   Cardiovascular: Negative for chest pain and palpitations.  Gastrointestinal: Negative for abdominal pain and abdominal distention.  Endocrine:       Flushing and excessive flushing  Genitourinary: Negative for dysuria, frequency and difficulty urinating.  Musculoskeletal: Negative for back pain, joint swelling and arthralgias.  Skin: Negative for color change.  Neurological: Negative for dizziness, tremors and weakness.  Psychiatric/Behavioral: Negative for suicidal ideas and behavioral problems.         Objective: Filed Vitals:   01/19/15 1007  BP: 136/84  Pulse: 117  Temp: 98 F (36.7 C)  Resp: 18  Physical Exam  Constitutional: He is oriented to person, place, and time. He appears well-developed and well-nourished.  HENT:  Head: Normocephalic and atraumatic.  Right Ear: External ear normal.  Left Ear: External ear normal.  Eyes: Conjunctivae and EOM are normal. Pupils are equal, round, and reactive to light.  Neck: Normal range of motion. Neck supple. No tracheal deviation present.  Cardiovascular: Regular rhythm and normal heart sounds.  Tachycardia present.   No murmur heard. Pulmonary/Chest: Effort normal and  breath sounds normal. No respiratory distress. He has no wheezes. He exhibits no tenderness.  Abdominal: Soft. Bowel sounds are normal. He exhibits no mass. There is no tenderness.  Musculoskeletal: Normal range of motion. He exhibits no edema or tenderness.  Neurological: He is alert and oriented to person, place, and time.  Skin: Skin is warm and dry.  Psychiatric: He has a normal mood and affect.            Assessment & Plan:  54 year old male patient with a history of aortic dissection, uncontrolled hypertension, benign paroxysmal positional vertigo here for follow-up after medication change.  Hypertension: Controlled but he still complains of flushing and sweating a lot and so I will discontinue amlodipine which could be the culprit and have replaced it with metoprolol. We'll reassess symptoms that next office visit.  Benign paroxysmal positional vertigo: Symptoms have improved with meclizine but he is sedated and so I have advised him to take it just at night and skips the day time dose  Given note to stay out of work for now.  Flushing: This could be secondary to amlodipine which I have discontinued.  Aortic dissection: Currently managed medically with blood pressure goal of less than 130/90 Will be reassessed by cardiothoracic surgery in 6 months.

## 2015-01-19 NOTE — Progress Notes (Signed)
Here for BP follow up. Patient still having dizziness and not feeling well. Patient reports feeling worse after taking medication. Patient reports feeling good until he takes medication. Patient wants the medication to work but patient is very discouraged with the way he feels.

## 2015-01-20 ENCOUNTER — Telehealth: Payer: Self-pay | Admitting: *Deleted

## 2015-01-20 NOTE — Telephone Encounter (Signed)
Please inform him that he could decrease his metoprolol to half a tablet (25mg ) twice a day and I would need him to check his blood pressure in both arms and give the clinic a call with his readings tomorrow.

## 2015-01-20 NOTE — Telephone Encounter (Signed)
Patient was switched yesterday from amlodipine to metoprolol because he felt the medication was causing severe diaphoresis.  Patient called in after taking one dose of metoprolol and said his sweating was far worse.  Please advise.

## 2015-01-21 ENCOUNTER — Telehealth: Payer: Self-pay | Admitting: *Deleted

## 2015-01-21 NOTE — Telephone Encounter (Signed)
Mr. [name] called back in to ask if Dr. Venetia NightAmao had responded about his severe diaphoresis that he believes is coming from his blood pressure medication.  I told him that Dr. Venetia NightAmao wanted him to take 25mg  of his metoprolol twice a day instead of 50 mg twice a day.  The patient seemed very frustrated with me and told me something did  Not feel right with his body on these medications.  He said he is still sweating profusely and he feels his "heart skips a beat."  I advised him to take the new dose today and if he still felt poorly to come in as a walk in patient tomorrow.  Patient in agreement.

## 2015-01-21 NOTE — Telephone Encounter (Signed)
If his heart skips a beat and he is still diaphoretic I would like him to go to the ED. Thanks.

## 2015-01-21 NOTE — Telephone Encounter (Signed)
Spoke to patient and asked how he was feeling.  He said he was still diaphoretic but he was working outside and it was hard to tell if it was the heat or the medication.  He said he did not think his heart was "skipping a beat."  He agreed to go to the emergency room if he gets home from work and is having symptoms of diaphoresis and palpitations.

## 2015-01-26 ENCOUNTER — Other Ambulatory Visit: Payer: Self-pay

## 2015-01-26 ENCOUNTER — Encounter: Payer: Self-pay | Admitting: Family Medicine

## 2015-01-26 ENCOUNTER — Ambulatory Visit: Payer: Self-pay | Attending: Family Medicine | Admitting: Family Medicine

## 2015-01-26 ENCOUNTER — Ambulatory Visit (HOSPITAL_COMMUNITY)
Admission: RE | Admit: 2015-01-26 | Discharge: 2015-01-26 | Disposition: A | Discharge status: Home / Self Care | Payer: Self-pay | Source: Ambulatory Visit | Attending: Cardiology | Admitting: Cardiology

## 2015-01-26 VITALS — BP 139/82 | HR 87 | Temp 97.5°F | Resp 18 | Ht 69.0 in | Wt 243.0 lb

## 2015-01-26 DIAGNOSIS — I10 Essential (primary) hypertension: Secondary | ICD-10-CM | POA: Insufficient documentation

## 2015-01-26 DIAGNOSIS — H8110 Benign paroxysmal vertigo, unspecified ear: Secondary | ICD-10-CM | POA: Insufficient documentation

## 2015-01-26 DIAGNOSIS — R9431 Abnormal electrocardiogram [ECG] [EKG]: Secondary | ICD-10-CM | POA: Insufficient documentation

## 2015-01-26 DIAGNOSIS — R000 Tachycardia, unspecified: Secondary | ICD-10-CM

## 2015-01-26 DIAGNOSIS — I7101 Dissection of thoracic aorta: Secondary | ICD-10-CM

## 2015-01-26 DIAGNOSIS — I71019 Dissection of thoracic aorta, unspecified: Secondary | ICD-10-CM

## 2015-01-26 DIAGNOSIS — H8113 Benign paroxysmal vertigo, bilateral: Secondary | ICD-10-CM

## 2015-01-26 DIAGNOSIS — I7100 Dissection of unspecified site of aorta: Secondary | ICD-10-CM | POA: Insufficient documentation

## 2015-01-26 DIAGNOSIS — F17210 Nicotine dependence, cigarettes, uncomplicated: Secondary | ICD-10-CM | POA: Insufficient documentation

## 2015-01-26 DIAGNOSIS — R5383 Other fatigue: Secondary | ICD-10-CM | POA: Insufficient documentation

## 2015-01-26 LAB — BASIC METABOLIC PANEL
BUN: 24 mg/dL — AB (ref 6–23)
CO2: 24 mEq/L (ref 19–32)
CREATININE: 1.29 mg/dL (ref 0.50–1.35)
Calcium: 9.7 mg/dL (ref 8.4–10.5)
Chloride: 99 mEq/L (ref 96–112)
Glucose, Bld: 91 mg/dL (ref 70–99)
POTASSIUM: 4.7 meq/L (ref 3.5–5.3)
Sodium: 137 mEq/L (ref 135–145)

## 2015-01-26 MED ORDER — LOSARTAN POTASSIUM 100 MG PO TABS: 100.0000 mg | ORAL_TABLET | Freq: Every day | ORAL | Status: DC

## 2015-01-26 MED ORDER — METOPROLOL TARTRATE 25 MG PO TABS: 25.0000 mg | ORAL_TABLET | Freq: Two times a day (BID) | ORAL | Status: DC

## 2015-01-26 NOTE — Patient Instructions (Signed)

## 2015-01-26 NOTE — Progress Notes (Signed)
Subjective:    Patient ID: [name], male    DOB: 07/17/1961, 54 y.o.   MRN: [id number]  HPI [name] is a  54 year old patient with a history of previously uncontrolled hypertension, recently hospitalized for acute alcoholic pancreatitis, type B aortic dissection with medical management and no surgical intervention at this time; he is scheduled to see the vascular surgeon in 5 months for follow-up.  I have seen him every week since discharge due to his inability to tolerate his antihypertensives as he had complained that whenever he took his medication he felt drained and noticed excessive diaphoresis. At his last office visit I had discontinued amlodipine and added metoprolol tartrate to his regimen (as he was tachycardic) which he was supposed to take along with losartan and hydrochlorothiazide. I had also commenced meclozine for treatment of vertigo. He called into the clinic with persisting symptoms and I have decreased his metoprolol from 50-25 mg and he subsequently complained of" fluttering of his heart" for which he was advised to present to the ED which he never did.  States once he cut his metoprolol in half the sweating stopped and "his heart did not flutter anymore". States he feels "like he is in a funk after taking the medications and feels different." He decided to go to work but felt when he went to work he had low energy and was drained.  Blood pressure log reviewed : BP 121/79- 147/86  Past Medical History  Diagnosis Date  . Malignant hypertension   . Aortic dissection, thoracic     Needs follow up with vascular surgeon in 6 mos.      Past Surgical History  Procedure Laterality Date  . Rectal abscess     Family History  Problem Relation Age of Onset  . Diabetes Father   . Heart disease Father   . Dementia Father     History   Social History  . Marital Status: Divorced    Spouse Name: N/A  . Number of Children: N/A  . Years of Education: N/A    Occupational History  . Not on file.   Social History Main Topics  . Smoking status: Current Some Day Smoker    Types: Cigarettes  . Smokeless tobacco: Not on file  . Alcohol Use: No  . Drug Use: No     Comment: Patient denies marijuana and cociane use 01/19/15  . Sexual Activity: Yes   Other Topics Concern  . Not on file   Social History Narrative    No Known Allergies  Current Outpatient Prescriptions on File Prior to Visit  Medication Sig Dispense Refill  . meclizine (ANTIVERT) 25 MG tablet Take 1 tablet (25 mg total) by mouth 2 (two) times daily as needed. 60 tablet 1   No current facility-administered medications on file prior to visit.     Review of Systems     Objective: Filed Vitals:   01/26/15 1000  BP: 139/82  Pulse: 87  Temp: 97.5 F (36.4 C)  TempSrc: Oral  Resp: 18  Height: 5\' 9"  (1.753 m)  Weight: 243 lb (110.224 kg)  SpO2: 94%      Physical Exam  Constitutional: He is oriented to person, place, and time. He appears well-developed and well-nourished.  HENT:  Head: Normocephalic and atraumatic.  Right Ear: External ear normal.  Left Ear: External ear normal.  Eyes: Conjunctivae and EOM are normal. Pupils are equal, round, and reactive to light.  Neck: Normal range of motion.  Neck supple. No tracheal deviation present.  Cardiovascular: Normal rate, regular rhythm and normal heart sounds.   No murmur heard. Pulmonary/Chest: Effort normal and breath sounds normal. No respiratory distress. He has no wheezes. He exhibits no tenderness.  Abdominal: Soft. Bowel sounds are normal. He exhibits no mass. There is no tenderness.  Musculoskeletal: Normal range of motion. He exhibits no edema or tenderness.  Neurological: He is alert and oriented to person, place, and time.  Skin: Skin is warm and dry.  Psychiatric: He has a normal mood and affect.            Assessment & Plan:  54 year old male patient with a history of aortic dissection,  uncontrolled hypertension, benign paroxysmal positional vertigo here for follow-up after medication change.  Hypertension: Controlled Due to the fact that he still complains of " side effects" of his antihypertensives I will be discontinuing hydrochlorothiazide as he could be dehydrated from this leading to some of the symptoms he might be having and increasing his losartan from 50 mg to 100 mg. I'll reassess his blood pressure at his next office visit.  Abnormal EKG "Irregular heart beat as per patient" which I do not detect on my exam. During my examination I did notice flickering of his chest wall muscles on both the right and left prior to auscultation. Nevertheless I would go ahead with an EKG on him to exclude a cardiac etiology. EKG revealed normal sinus rhythm with left axis deviation and inverted T waves in lateral leads (suspicious for ischemia) compared with previous EKG and unchanged from previous. We'll refer to cardiology in-house especially with a history of aortic dissection.  Fatigue: He could be dehydrated from his hydrochlorothiazide. I am sending off a basic metabolic panel, vitamin D level and will check his glucose given he has a history of prediabetes as well even though his A1c was 6.1 in 12/2014 He is not anemic from his last CBC and so that excludes anemia as an etiology.  Disclaimer: This note was dictated with voice recognition software. Similar sounding words can inadvertently be transcribed and this note may contain transcription errors which may not have been corrected upon publication of note.

## 2015-01-26 NOTE — Progress Notes (Signed)
Patient here for follow up for BP and meds. Patient reports not sweating as much but still has "an eery feeling", feels tired, down and not energetic. Patient denies pain at this time.

## 2015-01-27 ENCOUNTER — Telehealth: Payer: Self-pay

## 2015-01-27 ENCOUNTER — Other Ambulatory Visit: Payer: Self-pay | Admitting: Family Medicine

## 2015-01-27 LAB — VITAMIN D 25 HYDROXY (VIT D DEFICIENCY, FRACTURES): Vit D, 25-Hydroxy: 15 ng/mL — ABNORMAL LOW (ref 30–100)

## 2015-01-27 NOTE — Telephone Encounter (Signed)
Patient called nurse, nurse was not available. Nurse returned call to patient. Patient verified date of birth. Patient was requesting refill on losartan. Nurse informed patient of refill at pharmacy. Patient voices understanding.

## 2015-01-28 ENCOUNTER — Telehealth: Payer: Self-pay

## 2015-01-28 ENCOUNTER — Other Ambulatory Visit: Payer: Self-pay | Admitting: Family Medicine

## 2015-01-28 DIAGNOSIS — E559 Vitamin D deficiency, unspecified: Secondary | ICD-10-CM

## 2015-01-28 MED ORDER — ERGOCALCIFEROL 1.25 MG (50000 UT) PO CAPS: 50000.0000 [IU] | ORAL_CAPSULE | ORAL | Status: DC

## 2015-01-28 NOTE — Telephone Encounter (Signed)
Patient called earlier, see previous message. Patient verified date of birth. Patient aware of low vitamin D and medication at pharmacy. Patient agrees to pick medication up at pharmacy and start medication.

## 2015-01-28 NOTE — Telephone Encounter (Signed)
-----   Message from Enobong Amao, MD sent at 01/28/2015  8:18 AM EDT ----- Labs revealed low vitamin D and so I have placed him on replacement. 

## 2015-01-28 NOTE — Telephone Encounter (Signed)
-----   Message from Jaclyn ShaggyEnobong Amao, MD sent at 01/28/2015  8:18 AM EDT ----- Labs revealed low vitamin D and so I have placed him on replacement.

## 2015-01-28 NOTE — Telephone Encounter (Signed)
Patient called nurse, complaining of sweating. Patient questioned going to ED for sweating. Patient informed nurse of having pressure in left, upper chest that comes and goes. Patient thinks this is related to indigestion. Nurse asked patient if he was having chest pain at this time. Patient denies chest pain or pressure at this time. Only complaint is sweating. Nurse advised patient to go to ED or call 911 if he starts having chest pain, pressure or shortness of breath. Nurse educated patient, ED is not needed for sweating unless other symptoms are accompanying sweating. Patient voices understanding and explains he will see Dr. Venetia NightAmao for his appointment on Tuesday.

## 2015-01-28 NOTE — Progress Notes (Signed)
Low vitamin D, placed on Drisdol.

## 2015-02-02 ENCOUNTER — Ambulatory Visit: Payer: Self-pay | Attending: Family Medicine | Admitting: Family Medicine

## 2015-02-02 ENCOUNTER — Encounter: Payer: Self-pay | Admitting: Family Medicine

## 2015-02-02 VITALS — BP 127/84 | HR 68 | Temp 97.4°F | Resp 18 | Ht 69.0 in | Wt 245.0 lb

## 2015-02-02 DIAGNOSIS — I71019 Dissection of thoracic aorta, unspecified: Secondary | ICD-10-CM

## 2015-02-02 DIAGNOSIS — R61 Generalized hyperhidrosis: Secondary | ICD-10-CM

## 2015-02-02 DIAGNOSIS — E559 Vitamin D deficiency, unspecified: Secondary | ICD-10-CM

## 2015-02-02 DIAGNOSIS — I10 Essential (primary) hypertension: Secondary | ICD-10-CM

## 2015-02-02 DIAGNOSIS — I7100 Dissection of unspecified site of aorta: Secondary | ICD-10-CM

## 2015-02-02 DIAGNOSIS — M169 Osteoarthritis of hip, unspecified: Secondary | ICD-10-CM | POA: Insufficient documentation

## 2015-02-02 DIAGNOSIS — R9431 Abnormal electrocardiogram [ECG] [EKG]: Secondary | ICD-10-CM

## 2015-02-02 DIAGNOSIS — M1611 Unilateral primary osteoarthritis, right hip: Secondary | ICD-10-CM

## 2015-02-02 DIAGNOSIS — I7101 Dissection of thoracic aorta: Secondary | ICD-10-CM

## 2015-02-02 MED ORDER — TRAMADOL HCL 50 MG PO TABS: 50.0000 mg | ORAL_TABLET | Freq: Three times a day (TID) | ORAL | Status: DC | PRN

## 2015-02-02 NOTE — Progress Notes (Signed)
Subjective:    Patient ID: [name], male    DOB: 09/02/1961, 54 y.o.   MRN: [id number]  HPI [name] is a  54 year old patient with a history of previously uncontrolled hypertension, hospitalized in 12/2014 for acute alcoholic pancreatitis, type B aortic dissection with medical management and no surgical intervention at this time; and will need to see the vascular surgeon in 5 months for follow-up.  I have seen him every week since discharge due to his inability to tolerate his antihypertensives as he had complained that whenever he took his medication he felt drained and noticed excessive diaphoresis. I initially discontinued amlodipine and added metoprolol tartrate to his regimen (as he was tachycardic) then later discontinued hydrochlorothiazide and increase his dose of losartan .I also had to decrease his dose of metoprolol as he was still symptomatic. His thyroid panel was normal. He subsequently complained of" fluttering of his heart" for which he had an EKG which revealed normal sinus rhythm with left axis deviation and inverted T waves in lateral leads (suspicious for ischemia)  but unchanged when compared to previous EKG and he was referred to cardiology in house especially in the setting of his history of aortic dissection.  Complains of R hip pain which is worse with change of position for which he had an x-ray during his hospitalization which revealed advanced arthropathic changes of the right hip consistent with osteoarthritis. Pain gets to 8 out of 10 and he takes OTC analgesic support. States his diaphoresis is minimal now.  Past Medical History  Diagnosis Date  . Malignant hypertension   . Aortic dissection, thoracic     Needs follow up with vascular surgeon in 6 mos.     Past Surgical History  Procedure Laterality Date  . Rectal abscess      Family History  Problem Relation Age of Onset  . Diabetes Father   . Heart disease Father   . Dementia Father      History   Social History  . Marital Status: Divorced    Spouse Name: [name]  . Number of Children: [name]  . Years of Education: [name]   Occupational History  . Not on file.   Social History Main Topics  . Smoking status: Current Every Day Smoker    Types: Cigarettes  . Smokeless tobacco: Not on file  . Alcohol Use: No  . Drug Use: No     Comment: Patient denies marijuana and cociane use 01/19/15  . Sexual Activity: Yes   Other Topics Concern  . Not on file   Social History Narrative    No Known Allergies  Current Outpatient Prescriptions on File Prior to Visit  Medication Sig Dispense Refill  . losartan (COZAAR) 100 MG tablet Take 1 tablet (100 mg total) by mouth daily. 30 tablet 1  . meclizine (ANTIVERT) 25 MG tablet Take 1 tablet (25 mg total) by mouth 2 (two) times daily as needed. 60 tablet 1  . metoprolol (LOPRESSOR) 25 MG tablet Take 1 tablet (25 mg total) by mouth 2 (two) times daily. 60 tablet 1  . ergocalciferol (DRISDOL) 50000 UNITS capsule Take 1 capsule (50,000 Units total) by mouth once a week. (Patient not taking: Reported on 02/02/2015) 9 capsule 0   No current facility-administered medications on file prior to visit.    Review of Systems General: negative for fever, weight loss, appetite change Eyes: no visual symptoms. ENT: no ear symptoms, no sinus tenderness, no nasal congestion or sore throat. Neck:  no pain  Respiratory: no wheezing, shortness of breath, cough Cardiovascular: no chest pain, no dyspnea on exertion, no pedal edema, no orthopnea. Gastrointestinal: no abdominal pain, no diarrhea, no constipation Genito-Urinary: no urinary frequency, no dysuria, no polyuria. Hematologic: no bruising Endocrine: Mild diaphoresis Neurological: no headaches, no seizures, no tremors Musculoskeletal: see HPI    Objective: Filed Vitals:   02/02/15 1037 02/02/15 1041  BP:  127/84  Pulse:  68  Temp:  97.4 F (36.3 C)  TempSrc:  Oral  Resp:  18  Height:   (1.753 m)   Weight: 245 lb (111.131 kg)   SpO2:  97%      Physical Exam  Constitutional: normal appearing,  Eyes: PERRLA HEENT: Head is atraumatic, normal sinuses, normal oropharynx, normal appearing tonsils and palate, tympanic membrane is normal bilaterally. Neck: normal range of motion, no thyromegaly, no JVD Cardiovascular: normal rate and rhythm, normal heart sounds, no murmurs, rub or gallop, no pedal edema Respiratory: clear to auscultation bilaterally, no wheezes, no rales, no rhonchi Abdomen: soft, not tender to palpation, normal bowel sounds, no enlarged organs Extremities: tenderness in right hip joint on ROM. Skin: warm and dry, no lesions. Neurological: alert, oriented x3, cranial nerves I-XII grossly intact , normal motor strength, normal sensation. Psychological: normal mood.   EXAM: RIGHT HIP (WITH PELVIS) 2-3 VIEWS  COMPARISON: None.  FINDINGS: There are advanced arthropathic changes of the right hip with marked superior lateral joint space narrowing prominent superior acetabular and right femoral head subchondral cystic change and sclerosis as well as osteophytes from the base of the right femoral head and from the lateral margin of the superior acetabulum. There is no acute fracture.  Left hip joint is normally spaced and aligned with no significant arthropathic change. SI joints and symphysis pubis are normally spaced and aligned.  Soft tissues are unremarkable.  IMPRESSION: 1. No acute fracture or dislocation. 2. Advanced arthropathic changes of the right hip, not evident on the left. Findings are consistent with osteoarthritis, which may be secondary to remote trauma or chronic avascular necrosis.   Electronically Signed  By: Amie Portland M.D.  On: 12/24/2014 11:55  CMP Latest Ref Rng 01/26/2015 01/06/2015 01/05/2015  Glucose 70 - 99 mg/dL 91 161(W) 960(A)  BUN 6 - 23 mg/dL 54(U) 12 10  Creatinine 0.50 - 1.35 mg/dL 9.81 1.91 4.78   Sodium 135 - 145 mEq/L 137 136 135  Potassium 3.5 - 5.3 mEq/L 4.7 4.1 3.6  Chloride 96 - 112 mEq/L 99 103 104  CO2 19 - 32 mEq/L Calcium 8.4 - 10.5 mg/dL 9.7 8.6 2.9(F)  Total Protein 6.0 - 8.3 g/dL - - -  Total Bilirubin 0.3 - 1.2 mg/dL - - -  Alkaline Phos 39 - 117 U/L - - -  AST 0 - 37 U/L - - -  ALT 0 - 53 U/L - - -          Assessment & Plan:  54 year old patient with a history of hypertension , hospitalized in 12/2014 for acute alcoholic pancreatitis, type B aortic dissection with medical management and no surgical intervention at this time who has been of the opinion that his antihypertensives were causing diaphoresis.  Diaphoresis: I think this is unlikely due to his antihypertensives as we have had multiple switches in the last few weeks. States this has improved; I would have loved to order PPD, metanephrines, HIV, cbc to exclude other etiologies but he is refusing at this time like to  hold off for now since his symptoms are improving.  Right hip osteoarthritis: X-ray revealed advanced changes. I will hold off on NSAIDs given his sharp rise in creatinine from 0.82-1.29 and will place him on tramadol instead. If symptoms persist he would be candidate for the referral to Ortho down the line.   Aortic dissection: Medical management for now and will need to see a thoracic surgeon in 5 months.  Abnormal EKG: Will need to exclude ischemic cardiomyopathy; scheduled to see Dr. Daleen Squibb next week.  Vitamin D deficiency: Yet to pick up Drisdol which I have advised him to pickup from the pharmacy today.  Disclaimer: This note was dictated with voice recognition software. Similar sounding words can inadvertently be transcribed and this note may contain transcription errors which may not have been corrected upon publication of note.

## 2015-02-02 NOTE — Patient Instructions (Signed)

## 2015-02-02 NOTE — Progress Notes (Signed)
Patient here for BP follow up. Patient reports BP still running high. Patient has BP journal with him. Patient still feels tired, sluggish but the sweating is not so bad. Patient wants to know if it would be better to take medication at night instead of morning.   Patient reports right hip hurts, at level 8, described as sharp, can't hardly walk. Pain is getting worse.   Not taking ergocalciferol, picking up prescription today. Patient ready to quit smoking.

## 2015-02-05 ENCOUNTER — Telehealth: Payer: Self-pay | Admitting: Clinical

## 2015-02-05 ENCOUNTER — Ambulatory Visit: Payer: Self-pay

## 2015-02-05 NOTE — Telephone Encounter (Signed)
Completed call.  

## 2015-02-10 ENCOUNTER — Telehealth: Payer: Self-pay | Admitting: Family Medicine

## 2015-02-10 ENCOUNTER — Ambulatory Visit: Payer: Self-pay | Admitting: Cardiology

## 2015-02-10 NOTE — Telephone Encounter (Signed)
Patient called to request to speak to nurse, he stated that his blood pressure is rising and the medication is not helping. Please f/u with pt. °

## 2015-02-12 NOTE — Telephone Encounter (Signed)
Patient called to request to speak to nurse, he stated that his blood pressure is rising and the medication is not helping. Please f/u with pt.

## 2015-02-12 NOTE — Telephone Encounter (Signed)
Patient reports pain medication for hip isn't working. Patient reports having more difficult time "getting around". Patient transferred to front office to schedule appointment for Monday.

## 2015-02-15 ENCOUNTER — Ambulatory Visit: Payer: Self-pay | Admitting: Family Medicine

## 2015-02-16 ENCOUNTER — Ambulatory Visit: Payer: Self-pay | Attending: Internal Medicine | Admitting: Internal Medicine

## 2015-02-16 ENCOUNTER — Encounter: Payer: Self-pay | Admitting: Internal Medicine

## 2015-02-16 VITALS — BP 146/97 | HR 57 | Temp 98.0°F | Resp 16 | Wt 246.6 lb

## 2015-02-16 DIAGNOSIS — I10 Essential (primary) hypertension: Secondary | ICD-10-CM

## 2015-02-16 DIAGNOSIS — I7101 Dissection of thoracic aorta: Secondary | ICD-10-CM | POA: Insufficient documentation

## 2015-02-16 DIAGNOSIS — Z720 Tobacco use: Secondary | ICD-10-CM

## 2015-02-16 DIAGNOSIS — F17210 Nicotine dependence, cigarettes, uncomplicated: Secondary | ICD-10-CM | POA: Insufficient documentation

## 2015-02-16 DIAGNOSIS — Z87891 Personal history of nicotine dependence: Secondary | ICD-10-CM | POA: Insufficient documentation

## 2015-02-16 DIAGNOSIS — M1611 Unilateral primary osteoarthritis, right hip: Secondary | ICD-10-CM

## 2015-02-16 DIAGNOSIS — F17200 Nicotine dependence, unspecified, uncomplicated: Secondary | ICD-10-CM

## 2015-02-16 MED ORDER — KETOROLAC TROMETHAMINE 60 MG/2ML IM SOLN
60.0000 mg | Freq: Once | INTRAMUSCULAR | Status: AC
  Administered 2015-02-16: 60 mg via INTRAMUSCULAR

## 2015-02-16 NOTE — Progress Notes (Signed)
MRN: [id number] Name: MONTEE TALLMAN  Sex: male Age: 54 y.o. DOB: 01-03-1961  Allergies: Review of patient's allergies indicates no known allergies.  Chief Complaint  Patient presents with  . Hip Pain    HPI: Patient is 54 y.o. male who has history of hypertension,history of alcohol abuse, history of arthritis, was seen in positional care clinic comes today as a walk-in patient complaining of right hip pain, previous x-ray reviewed her noticed arthritis patient has been prescribed tramadol which as per patient he is taking, he needs some shot to help him with worsening pain, denies any recent fall or trauma.  Past Medical History  Diagnosis Date  . Malignant hypertension   . Aortic dissection, thoracic     Needs follow up with vascular surgeon in 6 mos.     Past Surgical History  Procedure Laterality Date  . Rectal abscess        Medication List       This list is accurate as of: 02/16/15  4:53 PM.  Always use your most recent med list.               ergocalciferol 50000 UNITS capsule  Commonly known as:  DRISDOL  Take 1 capsule (50,000 Units total) by mouth once a week.     losartan 100 MG tablet  Commonly known as:  COZAAR  Take 1 tablet (100 mg total) by mouth daily.     meclizine 25 MG tablet  Commonly known as:  ANTIVERT  Take 1 tablet (25 mg total) by mouth 2 (two) times daily as needed.     metoprolol tartrate 25 MG tablet  Commonly known as:  LOPRESSOR  Take 1 tablet (25 mg total) by mouth 2 (two) times daily.     traMADol 50 MG tablet  Commonly known as:  ULTRAM  Take 1 tablet (50 mg total) by mouth every 8 (eight) hours as needed.        Meds ordered this encounter  Medications  . ketorolac (TORADOL) injection 60 mg    Sig:      There is no immunization history on file for this patient.  Family History  Problem Relation Age of Onset  . Diabetes Father   . Heart disease Father   . Dementia Father     History  Substance Use Topics   . Smoking status: Current Every Day Smoker    Types: Cigarettes  . Smokeless tobacco: Not on file  . Alcohol Use: No    Review of Systems   As noted in HPI  Filed Vitals:   02/16/15 1523  BP: 146/97  Pulse: 57  Temp: 98 F (36.7 C)  Resp: 16    Physical Exam  Physical Exam  Cardiovascular: Normal rate and regular rhythm.   Pulmonary/Chest: Breath sounds normal. No respiratory distress. He has no wheezes. He has no rales.  Musculoskeletal:  Right hip tenderness laterally some limitation with full range of motion    CBC    Component Value Date/Time   WBC 11.5* 01/06/2015 0251   RBC 5.72 01/06/2015 0251   HGB 14.8 01/06/2015 0251   HCT 44.3 01/06/2015 0251   PLT 282 01/06/2015 0251   MCV 77.4* 01/06/2015 0251   LYMPHSABS 2.7 01/02/2015 0850   MONOABS 0.9 01/02/2015 0850   EOSABS 0.1 01/02/2015 0850   BASOSABS 0.1 01/02/2015 0850    CMP     Component Value Date/Time   NA 137 01/26/2015 1113   K  4.7 01/26/2015 1113   CL 99 01/26/2015 1113   CO2 24 01/26/2015 1113   GLUCOSE 91 01/26/2015 1113   BUN 24* 01/26/2015 1113   CREATININE 1.29 01/26/2015 1113   CREATININE 0.82 01/06/2015 0251   CALCIUM 9.7 01/26/2015 1113   PROT 7.0 01/02/2015 0850   ALBUMIN 3.8 01/02/2015 0850   AST 25 01/02/2015 0850   ALT 18 01/02/2015 0850   ALKPHOS 101 01/02/2015 0850   BILITOT 1.0 01/02/2015 0850   GFRNONAA >90 01/06/2015 0251   GFRAA >90 01/06/2015 0251    No results found for: CHOL  Lab Results  Component Value Date/Time   HGBA1C 6.1* 01/02/2015 04:25 PM    Lab Results  Component Value Date/Time   AST 25 01/02/2015 08:50 AM    Assessment and Plan  Osteoarthritis of right hip, unspecified osteoarthritis type - Plan: x-ray done in April reported IMPRESSION: 1. No acute fracture or dislocation. 2. Advanced arthropathic changes of the right hip, not evident on the left. Findings are consistent with osteoarthritis, which may be secondary to remote trauma or  chronic avascular necrosis.  patient was given ketorolac (TORADOL) injection 60 mg, he'll continue with tramadol, Tylenol when necessary, if symptoms are not getting better consider referral to orthopedics.  Essential hypertension Blood pressure today is borderline elevated, I have advised patient for DASH diet, counseled patient to quit smoking    Return in about 3 months (around 05/19/2015), or if symptoms worsen or fail to improve, for schedule apt with PCP .   This note has been created with Education officer, environmentalDragon speech recognition software and smart phrase technology. Any transcriptional errors are unintentional.    Doris CheadleADVANI, Shela Esses, MD

## 2015-02-16 NOTE — Patient Instructions (Signed)
DASH Eating Plan °DASH stands for "Dietary Approaches to Stop Hypertension." The DASH eating plan is a healthy eating plan that has been shown to reduce high blood pressure (hypertension). Additional health benefits may include reducing the risk of type 2 diabetes mellitus, heart disease, and stroke. The DASH eating plan may also help with weight loss. °WHAT DO I NEED TO KNOW ABOUT THE DASH EATING PLAN? °For the DASH eating plan, you will follow these general guidelines: °· Choose foods with a percent daily value for sodium of less than 5% (as listed on the food label). °· Use salt-free seasonings or herbs instead of table salt or sea salt. °· Check with your health care provider or pharmacist before using salt substitutes. °· Eat lower-sodium products, often labeled as "lower sodium" or "no salt added." °· Eat fresh foods. °· Eat more vegetables, fruits, and low-fat dairy products. °· Choose whole grains. Look for the word "whole" as the first word in the ingredient list. °· Choose fish and skinless chicken or turkey more often than red meat. Limit fish, poultry, and meat to 6 oz (170 g) each day. °· Limit sweets, desserts, sugars, and sugary drinks. °· Choose heart-healthy fats. °· Limit cheese to 1 oz (28 g) per day. °· Eat more home-cooked food and less restaurant, buffet, and fast food. °· Limit fried foods. °· Cook foods using methods other than frying. °· Limit canned vegetables. If you do use them, rinse them well to decrease the sodium. °· When eating at a restaurant, ask that your food be prepared with less salt, or no salt if possible. °WHAT FOODS CAN I EAT? °Seek help from a dietitian for individual calorie needs. °Grains °Whole grain or whole wheat bread. Brown rice. Whole grain or whole wheat pasta. Quinoa, bulgur, and whole grain cereals. Low-sodium cereals. Corn or whole wheat flour tortillas. Whole grain cornbread. Whole grain crackers. Low-sodium crackers. °Vegetables °Fresh or frozen vegetables  (raw, steamed, roasted, or grilled). Low-sodium or reduced-sodium tomato and vegetable juices. Low-sodium or reduced-sodium tomato sauce and paste. Low-sodium or reduced-sodium canned vegetables.  °Fruits °All fresh, canned (in natural juice), or frozen fruits. °Meat and Other Protein Products °Ground beef (85% or leaner), grass-fed beef, or beef trimmed of fat. Skinless chicken or turkey. Ground chicken or turkey. Pork trimmed of fat. All fish and seafood. Eggs. Dried beans, peas, or lentils. Unsalted nuts and seeds. Unsalted canned beans. °Dairy °Low-fat dairy products, such as skim or 1% milk, 2% or reduced-fat cheeses, low-fat ricotta or cottage cheese, or plain low-fat yogurt. Low-sodium or reduced-sodium cheeses. °Fats and Oils °Tub margarines without trans fats. Light or reduced-fat mayonnaise and salad dressings (reduced sodium). Avocado. Safflower, olive, or canola oils. Natural peanut or almond butter. °Other °Unsalted popcorn and pretzels. °The items listed above may not be a complete list of recommended foods or beverages. Contact your dietitian for more options. °WHAT FOODS ARE NOT RECOMMENDED? °Grains °White bread. White pasta. White rice. Refined cornbread. Bagels and croissants. Crackers that contain trans fat. °Vegetables °Creamed or fried vegetables. Vegetables in a cheese sauce. Regular canned vegetables. Regular canned tomato sauce and paste. Regular tomato and vegetable juices. °Fruits °Dried fruits. Canned fruit in light or heavy syrup. Fruit juice. °Meat and Other Protein Products °Fatty cuts of meat. Ribs, chicken wings, bacon, sausage, bologna, salami, chitterlings, fatback, hot dogs, bratwurst, and packaged luncheon meats. Salted nuts and seeds. Canned beans with salt. °Dairy °Whole or 2% milk, cream, half-and-half, and cream cheese. Whole-fat or sweetened yogurt. Full-fat   cheeses or blue cheese. Nondairy creamers and whipped toppings. Processed cheese, cheese spreads, or cheese  curds. °Condiments °Onion and garlic salt, seasoned salt, table salt, and sea salt. Canned and packaged gravies. Worcestershire sauce. Tartar sauce. Barbecue sauce. Teriyaki sauce. Soy sauce, including reduced sodium. Steak sauce. Fish sauce. Oyster sauce. Cocktail sauce. Horseradish. Ketchup and mustard. Meat flavorings and tenderizers. Bouillon cubes. Hot sauce. Tabasco sauce. Marinades. Taco seasonings. Relishes. °Fats and Oils °Butter, stick margarine, lard, shortening, ghee, and bacon fat. Coconut, palm kernel, or palm oils. Regular salad dressings. °Other °Pickles and olives. Salted popcorn and pretzels. °The items listed above may not be a complete list of foods and beverages to avoid. Contact your dietitian for more information. °WHERE CAN I FIND MORE INFORMATION? °[employer], [employer], [employer]: www.nhlbi.nih.gov/health/health-topics/topics/dash/ °Document Released: 08/17/2011 Document Revised: 01/12/2014 Document Reviewed: 07/02/2013 °Exit[employer]® Patient Information ©2015 [employer]. This information is not intended to replace advice given to you by your health care provider. Make sure you discuss any questions you have with your health care provider. ° °

## 2015-02-16 NOTE — Progress Notes (Signed)
Patient here for follow up from [facility] Patient complains of pain to his left hip that has gotten progressively worse Patient has trouble walking, sitting too long,etc. Patient has not been seen by any specialists for his condition Patient was prescribed tramadol but states it is doing nothing for him

## 2015-02-17 ENCOUNTER — Other Ambulatory Visit: Payer: Self-pay | Admitting: *Deleted

## 2015-02-17 ENCOUNTER — Ambulatory Visit: Payer: MEDICAID | Attending: Cardiology | Admitting: Cardiology

## 2015-02-17 ENCOUNTER — Encounter: Payer: Self-pay | Admitting: Cardiology

## 2015-02-17 VITALS — BP 148/97 | HR 74 | Temp 98.4°F | Resp 18 | Ht 68.0 in | Wt 246.8 lb

## 2015-02-17 DIAGNOSIS — R9431 Abnormal electrocardiogram [ECG] [EKG]: Secondary | ICD-10-CM

## 2015-02-17 MED ORDER — METOPROLOL TARTRATE 25 MG PO TABS: 25.0000 mg | ORAL_TABLET | Freq: Two times a day (BID) | ORAL | Status: DC

## 2015-02-17 MED ORDER — LOSARTAN POTASSIUM 100 MG PO TABS
100.0000 mg | ORAL_TABLET | Freq: Every day | ORAL | Status: DC
Start: 2015-02-17 — End: 2015-11-05

## 2015-02-17 MED ORDER — LOSARTAN POTASSIUM-HCTZ 100-12.5 MG PO TABS: 1.0000 | ORAL_TABLET | Freq: Every day | ORAL | Status: DC

## 2015-02-17 MED ORDER — METOPROLOL TARTRATE 50 MG PO TABS: 50.0000 mg | ORAL_TABLET | Freq: Two times a day (BID) | ORAL | Status: DC

## 2015-02-17 NOTE — Assessment & Plan Note (Signed)
This is LVH with strain from poorly controlled hypertension. He has severe LVH on his echocardiogram with normal systolic to dynamic LV function.

## 2015-02-17 NOTE — Progress Notes (Signed)
HPI [name] is a 54 year old African-American male referred today for an abnormal EKG and echocardiogram. His long history of hypertension and has been noncompliant. He denies any chest pain.  EKG shows normal sinus rhythm with LVH and strain. Echocardiogram confirmed severe LVH with EF of 70%. He also has a mildly dilated aortic root but no evidence of aneurysm or aortic insufficiency. His aortic valve is mildly thickened.  Since taking his blood pressure medicines, his pressures are been in the 120s over 80s. He did not take his medicines this morning and hence is increased. He is limited salt. He is still smoking 2 cigarettes a day.  Denies orthopnea, PND, palpitations, presyncope or syncope, or edema.  Past Medical History  Diagnosis Date  . Malignant hypertension   . Aortic dissection, thoracic     Needs follow up with vascular surgeon in 6 mos.     Current Outpatient Prescriptions  Medication Sig Dispense Refill  . ergocalciferol (DRISDOL) 50000 UNITS capsule Take 1 capsule (50,000 Units total) by mouth once a week. 9 capsule 0  . traMADol (ULTRAM) 50 MG tablet Take 1 tablet (50 mg total) by mouth every 8 (eight) hours as needed. 40 tablet 1  . losartan (COZAAR) 100 MG tablet Take 1 tablet (100 mg total) by mouth daily. 90 tablet 3  . losartan-hydrochlorothiazide (HYZAAR) 100-12.5 MG per tablet Take 1 tablet by mouth daily. 90 tablet 3  . meclizine (ANTIVERT) 25 MG tablet Take 1 tablet (25 mg total) by mouth 2 (two) times daily as needed. (Patient not taking: Reported on 02/17/2015) 60 tablet 1  . metoprolol tartrate (LOPRESSOR) 25 MG tablet Take 1 tablet (25 mg total) by mouth 2 (two) times daily. 180 tablet 3   No current facility-administered medications for this visit.    No Known Allergies  Family History  Problem Relation Age of Onset  . Diabetes Father   . Heart disease Father   . Dementia Father     History   Social History  . Marital Status: Divorced    Spouse  Name: N/A  . Number of Children: N/A  . Years of Education: N/A   Occupational History  . Not on file.   Social History Main Topics  . Smoking status: Current Every Day Smoker    Types: Cigarettes  . Smokeless tobacco: Not on file  . Alcohol Use: No  . Drug Use: No     Comment: Patient denies marijuana and cociane use 01/19/15  . Sexual Activity: Yes   Other Topics Concern  . Not on file   Social History Narrative    ROS ALL NEGATIVE EXCEPT THOSE NOTED IN HPI  PE  General Appearance: well developed, well nourished in no acute distress, obese HEENT: symmetrical face, PERRLA, good dentition  Neck: no JVD, thyromegaly, or adenopathy, trachea midline Chest: symmetric without deformity Cardiac: PMI non-displaced, RRR, normal S1, S2, no gallop or murmur Lung: clear to ausculation and percussion Vascular: all pulses full without bruits  Extremities: no cyanosis, clubbing or edema, no sign of DVT, no varicosities  Skin: normal color, no rashes Neuro: alert and oriented x 3, non-focal Pysch: normal affect  EKG  BMET    Component Value Date/Time   NA 137 01/26/2015 1113   K 4.7 01/26/2015 1113   CL 99 01/26/2015 1113   CO2 24 01/26/2015 1113   GLUCOSE 91 01/26/2015 1113   BUN 24* 01/26/2015 1113   CREATININE 1.29 01/26/2015 1113   CREATININE 0.82 01/06/2015 0251  CALCIUM 9.7 01/26/2015 1113   GFRNONAA >90 01/06/2015 0251   GFRAA >90 01/06/2015 0251    Lipid Panel  No results found for: CHOL, TRIG, HDL, CHOLHDL, VLDL, LDLCALC  CBC    Component Value Date/Time   WBC 11.5* 01/06/2015 0251   RBC 5.72 01/06/2015 0251   HGB 14.8 01/06/2015 0251   HCT 44.3 01/06/2015 0251   PLT 282 01/06/2015 0251   MCV 77.4* 01/06/2015 0251   MCH 25.9* 01/06/2015 0251   MCHC 33.4 01/06/2015 0251   RDW 14.1 01/06/2015 0251   LYMPHSABS 2.7 01/02/2015 0850   MONOABS 0.9 01/02/2015 0850   EOSABS 0.1 01/02/2015 0850   BASOSABS 0.1 01/02/2015 0850

## 2015-02-17 NOTE — Patient Instructions (Signed)
No changes in your medications today.  Please take all your medications daily especially when you are coming to see the doctor.  As long as your blood pressure stays below 140/90 you are doing well and do not need to see me.  If your blood pressure is above 140/90 for several readings please make an appointment to see me again.

## 2015-02-17 NOTE — Progress Notes (Signed)
Patient presents as a new patient to Dr. Daleen SquibbWall.  Patient does not have any complaints of chest pain, only right hip pain.  Hip pain constant 6/10, described as throbbing.  Pain medicine does not help.  Patient states he is feeling "fairly well."   Current blood pressure 148/97, heart rate 74.  Patient currently smokes about 1-2 cigarettes/day.  States he is somewhat ready to quit.

## 2015-02-24 ENCOUNTER — Emergency Department (HOSPITAL_COMMUNITY)
Admission: EM | Admit: 2015-02-24 | Discharge: 2015-02-24 | Disposition: A | Discharge status: Home / Self Care | Payer: No Typology Code available for payment source | Attending: Emergency Medicine | Admitting: Emergency Medicine

## 2015-02-24 ENCOUNTER — Encounter (HOSPITAL_COMMUNITY): Payer: Self-pay | Admitting: Emergency Medicine

## 2015-02-24 ENCOUNTER — Emergency Department (HOSPITAL_COMMUNITY): Payer: No Typology Code available for payment source

## 2015-02-24 DIAGNOSIS — S7001XA Contusion of right hip, initial encounter: Secondary | ICD-10-CM | POA: Insufficient documentation

## 2015-02-24 DIAGNOSIS — Y998 Other external cause status: Secondary | ICD-10-CM | POA: Insufficient documentation

## 2015-02-24 DIAGNOSIS — S0990XA Unspecified injury of head, initial encounter: Secondary | ICD-10-CM | POA: Insufficient documentation

## 2015-02-24 DIAGNOSIS — Y92410 Unspecified street and highway as the place of occurrence of the external cause: Secondary | ICD-10-CM | POA: Diagnosis not present

## 2015-02-24 DIAGNOSIS — Z79899 Other long term (current) drug therapy: Secondary | ICD-10-CM | POA: Diagnosis not present

## 2015-02-24 DIAGNOSIS — I10 Essential (primary) hypertension: Secondary | ICD-10-CM | POA: Insufficient documentation

## 2015-02-24 DIAGNOSIS — Z720 Tobacco use: Secondary | ICD-10-CM | POA: Insufficient documentation

## 2015-02-24 DIAGNOSIS — Y9389 Activity, other specified: Secondary | ICD-10-CM | POA: Insufficient documentation

## 2015-02-24 MED ORDER — KETOROLAC TROMETHAMINE 60 MG/2ML IM SOLN
60.0000 mg | Freq: Once | INTRAMUSCULAR | Status: AC
  Administered 2015-02-24: 60 mg via INTRAMUSCULAR
  Filled 2015-02-24: qty 2
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
## 2015-02-24 NOTE — Discharge Instructions (Signed)
Contusion °A contusion is a deep bruise. Contusions are the result of an injury that caused bleeding under the skin. The contusion may turn blue, purple, or yellow. Minor injuries will give you a painless contusion, but more severe contusions may stay painful and swollen for a few weeks.  °CAUSES  °A contusion is usually caused by a blow, trauma, or direct force to an area of the body. °SYMPTOMS  °· Swelling and redness of the injured area. °· Bruising of the injured area. °· Tenderness and soreness of the injured area. °· Pain. °DIAGNOSIS  °The diagnosis can be made by taking a history and physical exam. An X-ray, CT scan, or MRI may be needed to determine if there were any associated injuries, such as fractures. °TREATMENT  °Specific treatment will depend on what area of the body was injured. In general, the best treatment for a contusion is resting, icing, elevating, and applying cold compresses to the injured area. Over-the-counter medicines may also be recommended for pain control. Ask your caregiver what the best treatment is for your contusion. °HOME CARE INSTRUCTIONS  °· Put ice on the injured area. °¨ Put ice in a plastic bag. °¨ Place a towel between your skin and the bag. °¨ Leave the ice on for 15-20 minutes, 3-4 times a day, or as directed by your health care provider. °· Only take over-the-counter or prescription medicines for pain, discomfort, or fever as directed by your caregiver. Your caregiver may recommend avoiding anti-inflammatory medicines (aspirin, ibuprofen, and naproxen) for 48 hours because these medicines may increase bruising. °· Rest the injured area. °· If possible, elevate the injured area to reduce swelling. °SEEK IMMEDIATE MEDICAL CARE IF:  °· You have increased bruising or swelling. °· You have pain that is getting worse. °· Your swelling or pain is not relieved with medicines. °MAKE SURE YOU:  °· Understand these instructions. °· Will watch your condition. °· Will get help right  away if you are not doing well or get worse. °Document Released: 06/07/2005 Document Revised: 09/02/2013 Document Reviewed: 07/03/2011 °ExitCare® Patient Information ©2015 [employer]. This information is not intended to replace advice given to you by your health care provider. Make sure you discuss any questions you have with your health care provider. ° °

## 2015-02-24 NOTE — ED Notes (Signed)
Pt to nurse first again wanting to leave.  Encouraged pt to wait to be seen.  Pt states he will wait a little while longer.

## 2015-03-01 ENCOUNTER — Ambulatory Visit: Payer: No Typology Code available for payment source | Attending: Family Medicine | Admitting: Family Medicine

## 2015-03-01 ENCOUNTER — Encounter: Payer: Self-pay | Admitting: Family Medicine

## 2015-03-01 VITALS — BP 171/84 | HR 80 | Temp 98.0°F | Resp 16 | Ht 68.0 in | Wt 247.0 lb

## 2015-03-01 DIAGNOSIS — F17200 Nicotine dependence, unspecified, uncomplicated: Secondary | ICD-10-CM | POA: Diagnosis not present

## 2015-03-01 DIAGNOSIS — I10 Essential (primary) hypertension: Secondary | ICD-10-CM

## 2015-03-01 DIAGNOSIS — Z720 Tobacco use: Secondary | ICD-10-CM

## 2015-03-01 DIAGNOSIS — I7103 Dissection of thoracoabdominal aorta: Secondary | ICD-10-CM | POA: Diagnosis not present

## 2015-03-01 DIAGNOSIS — M1611 Unilateral primary osteoarthritis, right hip: Secondary | ICD-10-CM

## 2015-03-01 MED ORDER — KETOROLAC TROMETHAMINE 60 MG/2ML IM SOLN
60.0000 mg | Freq: Once | INTRAMUSCULAR | Status: AC
  Administered 2015-03-01: 60 mg via INTRAMUSCULAR

## 2015-03-01 MED ORDER — ACETAMINOPHEN-CODEINE #3 300-30 MG PO TABS: 1.0000 | ORAL_TABLET | Freq: Three times a day (TID) | ORAL | Status: DC | PRN

## 2015-03-01 NOTE — Patient Instructions (Signed)
Mr. [name],  Thank you for coming in today  1. Severe arthritis in R hip confirmed on x-ray  toradol shot today Tylenol #3 for pain control  Apply for orange card so that orthopaedic referral can be processed  Walking with a cane in your L hand to off set the weight and pressure on your R side will be helpful as well   2. HTN: BP elevated today Continue losartan at the same time every day Low salt diet Congratulations on no alcohol or cocaine, now tackle quitting cigarettes  3. Smoking: Smoking cessation support: smoking cessation hotline: 1-800-QUIT-NOW.  Smoking cessation classes are available through [facility] and [facility]. Call [phone] or visit our website at [email or web address].  F/u in 4 weeks with RN for BP check F/u with me in 3 months   Dr. Armen Pickup

## 2015-03-01 NOTE — Assessment & Plan Note (Signed)
Severe arthritis in R hip confirmed on x-ray  toradol shot today Tylenol #3 for pain control  Apply for orange card so that orthopaedic referral can be processed  Walking with a cane in your L hand to off set the weight and pressure on your R side will be helpful as well
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
## 2015-03-01 NOTE — Assessment & Plan Note (Signed)
A: current smoker P: Cessation information provided   Smoking: Smoking cessation support: smoking cessation hotline: 1-800-QUIT-NOW.  Smoking cessation classes are available through [facility] and [facility]. Call [phone] or visit our website at [email or web address].
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
## 2015-03-01 NOTE — Assessment & Plan Note (Signed)
A; stable w/o CP, no cocaine abuse P: BP control with losartan and metoprolol Smoking cessation

## 2015-03-01 NOTE — Assessment & Plan Note (Signed)
HTN: BP elevated today Continue losartan at the same time every day Low salt diet Congratulations on no alcohol or cocaine, now tackle quitting cigarettes

## 2015-03-04 ENCOUNTER — Ambulatory Visit: Payer: Self-pay | Attending: Family Medicine

## 2015-03-10 ENCOUNTER — Ambulatory Visit: Payer: Self-pay | Attending: Family Medicine

## 2015-03-16 ENCOUNTER — Ambulatory Visit: Payer: Self-pay | Attending: Family Medicine

## 2015-03-19 ENCOUNTER — Ambulatory Visit (INDEPENDENT_AMBULATORY_CARE_PROVIDER_SITE_OTHER): Payer: Self-pay | Admitting: Family Medicine

## 2015-03-19 ENCOUNTER — Encounter: Payer: Self-pay | Admitting: Family Medicine

## 2015-03-19 VITALS — BP 139/83 | HR 91 | Ht 68.0 in | Wt 247.0 lb

## 2015-03-19 DIAGNOSIS — M1611 Unilateral primary osteoarthritis, right hip: Secondary | ICD-10-CM

## 2015-03-19 MED ORDER — METHYLPREDNISOLONE ACETATE 40 MG/ML IJ SUSP
40.0000 mg | Freq: Once | INTRAMUSCULAR | Status: AC
  Administered 2015-03-19: 40 mg via INTRA_ARTICULAR

## 2015-03-19 NOTE — Progress Notes (Signed)
Patient ID: [name], male   DOB: 12/04/1960, 54 y.o.   [facility]: [id number] [facility]: Attending Note: I have reviewed the chart, discussed wit the Sports Medicine Fellow. I agree with assessment and treatment plan as detailed in the Fellow's note.

## 2015-03-19 NOTE — Progress Notes (Signed)
  Frank PavlovDwayne A Torres - 54 y.o. male MRN [id number]  Date of birth: 09/08/1961  SUBJECTIVE:  Including CC & ROS.  [name] is a 54 y.o. male who presents today for R hip pain.    Hip Pain R - [facility]  Pt with known OA of the R hip after no known inciting event or injury.  He states he started having generalized R hip pain about 2-3 years ago, in which he was able to tolerate.  However, about one year ago, insidious onset in which he started having severe R hip pain that affected his daily life.  He has seen his PCP for this in which x-rays demonstrated severe R hip OA and he was tried on tramadol with minimal relief, aleve which provides some relief, and Tylenol #3 with some relief.  He has also had IM gluteal injections of steroids for this, which has provided hip some relief for about 3-6 weeks.  First injection was April 2016 and last was June 2016.  Denies previous injury as child or infection, denies fever, chills, sweats, weight loss, paresthesias down his legs.  Does severely limit his daily life at this point along with his work life.    PMHx - Updated and reviewed.  Contributory factors include: HTN PSHx - Updated and reviewed.  Contributory factors include:  Smoker, 1/2 ppd, no IVDA or alcohol use  FHx - Updated and reviewed.  Contributory factors include:  Non contributory  Medications - losartan and Metoprolol    Exam:  Filed Vitals:   03/19/15 0901  BP: 139/83  Pulse: 91    Gen: NAD Cardiorespiratory - Normal respiratory effort/rate.  RRR R Hip Exam:  Pelvic alignment unremarkable to inspection and palpation. Standing hip rotation and gait without trendelenburg / unsteadiness. Greater trochanter without tenderness to palpation. No tenderness over piriformis and greater trochanter. No SI joint tenderness and normal minimal SI movement. ROM: IR: 20 Deg, ER: 20 Deg, Flexion: 60 Deg, Extension: 40 Deg, Abduction: 15 Deg,  Adduction: 15 Deg Strength:  IR: 5/5, ER: 5/5, Flexion (0 and 90 degrees): 5/5, Extension: 5/5, Abduction: 5/5, Adduction: 5/5 Positive FADIR.   + FABER to inside hip   Neurovascularly intact B/L LE  Imaging: B/L pelvis x-rays including 2 view of R hip showing severe joint space narrowing with subchondral cyst formation and osteophyte formation of the R hip.  Normal alignment of the L hip

## 2015-03-19 NOTE — Assessment & Plan Note (Addendum)
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  Unknown etiology but conservative measures will only last so long - Long detailed discussion about need for total hip arthroplasty as his x-rays show end stage OA - Injection into the hip joint today under US guidance  - Continue Aleve BID and Tylenol # 3 PRN  - Discussion with PCP about obtaining medicaid so he can obtain the hip replacement - F/U PRN for injection    Aspiration/Injection Procedure Note [name] 09/24/1960  Procedure: Injection Indications: R hip OA   Procedure Details Consent: Risks of procedure as well as the alternatives and risks of each were explained to the (patient/caregiver).  Consent for procedure obtained. Time Out: Verified patient identification, verified procedure, site/side was marked, verified correct patient position, special equipment/implants available, medications/allergies/relevent history reviewed, required imaging and test results available.  Performed.  The area was cleaned with iodine and alcohol swabs.    The R hip joint was injected into the joint space using 2 cc's of 40 mg Depomedrol and 6 cc's of 1% lidocaine with a 21 4" needle.  Ultrasound was used. Images were obtained in  Long views showing the injection.    A sterile dressing was applied.  Patient did tolerate procedure well. Estimated blood loss: 1 cc

## 2015-03-19 NOTE — Patient Instructions (Signed)
You have severe right hip arthritis probably related to the process called avascular necrosis. Ultimatelyyoue will likely benefit from a total hip replacement. We have tried a joint injection for you today to see if we can alleviate some of her pain.  Go see your primary care provider and discuss with them: 1. possible referral [facility] their [facility] in orthopedics for evaluation for hip replacement. 2 If your PCP office has a Child psychotherapistsocial worker, please discuss with them whether not you might be eligible for Medicaid which would certainly help you pay for the surgery.

## 2015-03-26 ENCOUNTER — Ambulatory Visit: Payer: Self-pay | Attending: Family Medicine | Admitting: Family Medicine

## 2015-03-26 ENCOUNTER — Encounter: Payer: Self-pay | Admitting: Family Medicine

## 2015-03-26 VITALS — BP 142/88 | HR 92 | Temp 98.4°F | Resp 16 | Ht 68.0 in | Wt 241.0 lb

## 2015-03-26 DIAGNOSIS — M1611 Unilateral primary osteoarthritis, right hip: Secondary | ICD-10-CM | POA: Insufficient documentation

## 2015-03-26 DIAGNOSIS — Z720 Tobacco use: Secondary | ICD-10-CM

## 2015-03-26 DIAGNOSIS — F17210 Nicotine dependence, cigarettes, uncomplicated: Secondary | ICD-10-CM | POA: Insufficient documentation

## 2015-03-26 NOTE — Assessment & Plan Note (Signed)
Please work on smoking cessation in anticipation of surgery in the future as most surgeons will not perform elective surgeries on active smokers.  Smoking cessation support: smoking cessation hotline: 1-800-QUIT-NOW.  Smoking cessation classes are available through [facility] and [facility]. Call [phone] or visit our website at [email or web address].

## 2015-03-26 NOTE — Progress Notes (Signed)
F/U referral [facility] or [facility] through their indigent clinic in orthopedics for evaluation for hip replacement Elevated BPNo Metoprolol this morning Pt stated run out and order them today

## 2015-03-26 NOTE — Progress Notes (Signed)
   Subjective:    Patient ID: [name], male    DOB: 04/14/1961, 54 y.o.   MRN: [id number] CC: R hip OA  HPI 54 yo M smoker presents for R hip OA  1. R hip OA: severe. Followed at sports medicine. Injection has helped. Takes ibuprofen. Rarely takes tylenol #3. 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 Pain interferes with ability to work a full day.   Soc Hx: current smoker   Review of Systems  Constitutional: Negative for fever and chills.  Musculoskeletal: Positive for arthralgias and gait problem.       Objective:   Physical Exam BP 142/88 mmHg  Pulse 92  Temp(Src) 98.4 F (36.9 C) (Oral)  Resp 16  Ht 5\' 8"  (1.727 m)  Wt 241 lb (109.317 kg)  BMI 36.65 kg/m2  SpO2 99% General appearance: alert, cooperative and no distress  R hip: ROM limited to 10 degrees of external rotation due to pain.        Assessment & Plan:

## 2015-03-26 NOTE — Patient Instructions (Addendum)
Mr. Delford FieldWright,  Severe osteoarthritis of R hip  I have placed a referral to ortho Please go to social services office to apply for medicaid and inquire about social security disability services   Please work on smoking cessation in anticipation of surgery in the future as most surgeons will not perform elective surgeries on active smokers.  Smoking cessation support: smoking cessation hotline: 1-800-QUIT-NOW.  Smoking cessation classes are available through [facility]. Call [phone] or visit our website at [email or web address].  F/u with me in 3 months for HTN  Dr. Armen PickupFunches

## 2015-03-26 NOTE — Assessment & Plan Note (Signed)
Severe osteoarthritis of R hip  I have placed a referral to ortho Please go to social services office to apply for medicaid and inquire about social security disability services

## 2015-08-13 ENCOUNTER — Encounter: Payer: Self-pay | Admitting: Vascular Surgery

## 2015-08-20 ENCOUNTER — Ambulatory Visit: Payer: Self-pay | Admitting: Vascular Surgery

## 2015-10-13 DIAGNOSIS — I639 Cerebral infarction, unspecified: Secondary | ICD-10-CM

## 2015-10-13 DIAGNOSIS — I2119 ST elevation (STEMI) myocardial infarction involving other coronary artery of inferior wall: Secondary | ICD-10-CM

## 2015-10-13 DIAGNOSIS — I255 Ischemic cardiomyopathy: Secondary | ICD-10-CM

## 2015-10-13 HISTORY — DX: Ischemic cardiomyopathy: I25.5

## 2015-10-13 HISTORY — DX: Cerebral infarction, unspecified: I63.9

## 2015-10-13 HISTORY — DX: ST elevation (STEMI) myocardial infarction involving other coronary artery of inferior wall: I21.19

## 2015-10-25 ENCOUNTER — Emergency Department (HOSPITAL_COMMUNITY): Payer: Medicaid Other

## 2015-10-25 ENCOUNTER — Inpatient Hospital Stay (HOSPITAL_COMMUNITY)
Admission: EM | Admit: 2015-10-25 | Discharge: 2015-11-05 | DRG: 219 | Disposition: A | Discharge status: Rehabilitation Facility | Payer: Medicaid Other | Attending: Cardiothoracic Surgery | Admitting: Cardiothoracic Surgery

## 2015-10-25 ENCOUNTER — Encounter (HOSPITAL_COMMUNITY): Payer: Self-pay | Admitting: Emergency Medicine

## 2015-10-25 ENCOUNTER — Encounter (HOSPITAL_COMMUNITY)
Admission: EM | Disposition: A | Discharge status: Rehabilitation Facility | Payer: Self-pay | Source: Home / Self Care | Attending: Cardiothoracic Surgery

## 2015-10-25 DIAGNOSIS — Z6838 Body mass index (BMI) 38.0-38.9, adult: Secondary | ICD-10-CM

## 2015-10-25 DIAGNOSIS — I69398 Other sequelae of cerebral infarction: Secondary | ICD-10-CM | POA: Diagnosis not present

## 2015-10-25 DIAGNOSIS — I639 Cerebral infarction, unspecified: Secondary | ICD-10-CM

## 2015-10-25 DIAGNOSIS — K219 Gastro-esophageal reflux disease without esophagitis: Secondary | ICD-10-CM | POA: Diagnosis present

## 2015-10-25 DIAGNOSIS — G8929 Other chronic pain: Secondary | ICD-10-CM | POA: Diagnosis present

## 2015-10-25 DIAGNOSIS — I69354 Hemiplegia and hemiparesis following cerebral infarction affecting left non-dominant side: Secondary | ICD-10-CM | POA: Diagnosis not present

## 2015-10-25 DIAGNOSIS — I63331 Cerebral infarction due to thrombosis of right posterior cerebral artery: Secondary | ICD-10-CM | POA: Diagnosis present

## 2015-10-25 DIAGNOSIS — R0602 Shortness of breath: Secondary | ICD-10-CM

## 2015-10-25 DIAGNOSIS — R7303 Prediabetes: Secondary | ICD-10-CM | POA: Diagnosis present

## 2015-10-25 DIAGNOSIS — I63531 Cerebral infarction due to unspecified occlusion or stenosis of right posterior cerebral artery: Secondary | ICD-10-CM | POA: Diagnosis not present

## 2015-10-25 DIAGNOSIS — I4891 Unspecified atrial fibrillation: Secondary | ICD-10-CM | POA: Diagnosis present

## 2015-10-25 DIAGNOSIS — I6621 Occlusion and stenosis of right posterior cerebral artery: Secondary | ICD-10-CM

## 2015-10-25 DIAGNOSIS — I2510 Atherosclerotic heart disease of native coronary artery without angina pectoris: Secondary | ICD-10-CM

## 2015-10-25 DIAGNOSIS — R109 Unspecified abdominal pain: Secondary | ICD-10-CM

## 2015-10-25 DIAGNOSIS — H539 Unspecified visual disturbance: Secondary | ICD-10-CM | POA: Diagnosis present

## 2015-10-25 DIAGNOSIS — R5381 Other malaise: Secondary | ICD-10-CM | POA: Diagnosis present

## 2015-10-25 DIAGNOSIS — H53462 Homonymous bilateral field defects, left side: Secondary | ICD-10-CM

## 2015-10-25 DIAGNOSIS — K5900 Constipation, unspecified: Secondary | ICD-10-CM | POA: Diagnosis present

## 2015-10-25 DIAGNOSIS — M1611 Unilateral primary osteoarthritis, right hip: Secondary | ICD-10-CM | POA: Diagnosis present

## 2015-10-25 DIAGNOSIS — I6501 Occlusion and stenosis of right vertebral artery: Secondary | ICD-10-CM | POA: Diagnosis present

## 2015-10-25 DIAGNOSIS — Z79899 Other long term (current) drug therapy: Secondary | ICD-10-CM

## 2015-10-25 DIAGNOSIS — J449 Chronic obstructive pulmonary disease, unspecified: Secondary | ICD-10-CM | POA: Diagnosis present

## 2015-10-25 DIAGNOSIS — I7100 Dissection of unspecified site of aorta: Secondary | ICD-10-CM | POA: Diagnosis not present

## 2015-10-25 DIAGNOSIS — H5347 Heteronymous bilateral field defects: Secondary | ICD-10-CM | POA: Diagnosis present

## 2015-10-25 DIAGNOSIS — D62 Acute posthemorrhagic anemia: Secondary | ICD-10-CM | POA: Diagnosis not present

## 2015-10-25 DIAGNOSIS — I10 Essential (primary) hypertension: Secondary | ICD-10-CM | POA: Diagnosis present

## 2015-10-25 DIAGNOSIS — F17210 Nicotine dependence, cigarettes, uncomplicated: Secondary | ICD-10-CM | POA: Diagnosis present

## 2015-10-25 DIAGNOSIS — I6330 Cerebral infarction due to thrombosis of unspecified cerebral artery: Secondary | ICD-10-CM

## 2015-10-25 DIAGNOSIS — I7101 Dissection of thoracic aorta: Principal | ICD-10-CM | POA: Diagnosis present

## 2015-10-25 DIAGNOSIS — K567 Ileus, unspecified: Secondary | ICD-10-CM | POA: Diagnosis not present

## 2015-10-25 DIAGNOSIS — E8770 Fluid overload, unspecified: Secondary | ICD-10-CM | POA: Diagnosis present

## 2015-10-25 DIAGNOSIS — I7102 Dissection of abdominal aorta: Secondary | ICD-10-CM

## 2015-10-25 DIAGNOSIS — I771 Stricture of artery: Secondary | ICD-10-CM

## 2015-10-25 DIAGNOSIS — F1410 Cocaine abuse, uncomplicated: Secondary | ICD-10-CM | POA: Diagnosis not present

## 2015-10-25 DIAGNOSIS — Z9689 Presence of other specified functional implants: Secondary | ICD-10-CM

## 2015-10-25 DIAGNOSIS — I71019 Dissection of thoracic aorta, unspecified: Secondary | ICD-10-CM

## 2015-10-25 HISTORY — DX: Cerebral infarction, unspecified: I63.9

## 2015-10-25 LAB — CBC
HEMATOCRIT: 48.1 % (ref 39.0–52.0)
HEMOGLOBIN: 15.5 g/dL (ref 13.0–17.0)
MCH: 26.5 pg (ref 26.0–34.0)
MCHC: 32.2 g/dL (ref 30.0–36.0)
MCV: 82.4 fL (ref 78.0–100.0)
Platelets: 218 10*3/uL (ref 150–400)
RBC: 5.84 MIL/uL — ABNORMAL HIGH (ref 4.22–5.81)
RDW: 14.1 % (ref 11.5–15.5)
WBC: 10.1 10*3/uL (ref 4.0–10.5)

## 2015-10-25 LAB — RAPID URINE DRUG SCREEN, HOSP PERFORMED
AMPHETAMINES: NOT DETECTED
Barbiturates: NOT DETECTED
Benzodiazepines: NOT DETECTED
Cocaine: POSITIVE — AB
OPIATES: NOT DETECTED
Tetrahydrocannabinol: NOT DETECTED

## 2015-10-25 LAB — BASIC METABOLIC PANEL
ANION GAP: 13 (ref 5–15)
BUN: 17 mg/dL (ref 6–20)
CALCIUM: 9.1 mg/dL (ref 8.9–10.3)
CO2: 24 mmol/L (ref 22–32)
Chloride: 101 mmol/L (ref 101–111)
Creatinine, Ser: 1.26 mg/dL — ABNORMAL HIGH (ref 0.61–1.24)
GFR calc Af Amer: >60 mL/min (ref >60)
Glucose, Bld: 137 mg/dL — ABNORMAL HIGH (ref 65–99)
POTASSIUM: 3.7 mmol/L (ref 3.5–5.1)
SODIUM: 138 mmol/L (ref 135–145)

## 2015-10-25 LAB — URINE MICROSCOPIC-ADD ON

## 2015-10-25 LAB — MRSA PCR SCREENING: MRSA by PCR: NEGATIVE

## 2015-10-25 LAB — URINALYSIS, ROUTINE W REFLEX MICROSCOPIC
BILIRUBIN URINE: NEGATIVE
Glucose, UA: NEGATIVE mg/dL
KETONES UR: NEGATIVE mg/dL
LEUKOCYTES UA: NEGATIVE
NITRITE: NEGATIVE
PH: 5 (ref 5.0–8.0)
Protein, ur: NEGATIVE mg/dL

## 2015-10-25 LAB — I-STAT TROPONIN, ED: TROPONIN I, POC: 0.01 ng/mL (ref 0.00–0.08)

## 2015-10-25 LAB — ABO/RH: ABO/RH(D): O POS

## 2015-10-25 LAB — PREPARE RBC (CROSSMATCH)

## 2015-10-25 SURGERY — REPAIR, ANEURYSM, AORTA, THORACIC, ASCENDING
Anesthesia: General

## 2015-10-25 MED ORDER — SODIUM CHLORIDE 0.9 % IV SOLN
INTRAVENOUS | Status: DC
  Administered 2015-10-28: 69.8 mL/h via INTRAVENOUS
  Administered 2015-10-28: 14 mL/h via INTRAVENOUS
  Filled 2015-10-25: qty 40

## 2015-10-25 MED ORDER — DOPAMINE-DEXTROSE 3.2-5 MG/ML-% IV SOLN
0.0000 ug/kg/min | INTRAVENOUS | Status: DC
  Filled 2015-10-25: qty 250

## 2015-10-25 MED ORDER — VANCOMYCIN HCL 10 G IV SOLR
1500.0000 mg | INTRAVENOUS | Status: DC
  Administered 2015-10-28: 1250 mg via INTRAVENOUS
  Filled 2015-10-25: qty 1500

## 2015-10-25 MED ORDER — SORBITOL 70 % SOLN: 30.0000 mL | Freq: Every day | Status: DC | PRN

## 2015-10-25 MED ORDER — SODIUM CHLORIDE 0.9 % IV SOLN
INTRAVENOUS | Status: DC
Start: 2015-10-25 — End: 2015-10-28
  Administered 2015-10-26 (×2): via INTRAVENOUS
  Administered 2015-10-26 – 2015-10-28 (×2): 75 mL via INTRAVENOUS

## 2015-10-25 MED ORDER — METOPROLOL TARTRATE 12.5 MG HALF TABLET
12.5000 mg | ORAL_TABLET | Freq: Once | ORAL | Status: AC
  Administered 2015-10-26: 12.5 mg via ORAL
  Filled 2015-10-25: qty 1

## 2015-10-25 MED ORDER — FENTANYL CITRATE (PF) 250 MCG/5ML IJ SOLN
INTRAMUSCULAR | Status: AC
  Filled 2015-10-25: qty 25

## 2015-10-25 MED ORDER — CHLORHEXIDINE GLUCONATE 0.12 % MT SOLN: 15.0000 mL | Freq: Once | OROMUCOSAL | Status: DC

## 2015-10-25 MED ORDER — NITROGLYCERIN IN D5W 200-5 MCG/ML-% IV SOLN
2.0000 ug/min | INTRAVENOUS | Status: DC
  Administered 2015-10-28: 5 ug/min via INTRAVENOUS
  Filled 2015-10-25: qty 250

## 2015-10-25 MED ORDER — DEXTROSE 5 % IV SOLN
750.0000 mg | INTRAVENOUS | Status: DC
  Filled 2015-10-25: qty 750

## 2015-10-25 MED ORDER — DEXMEDETOMIDINE HCL IN NACL 400 MCG/100ML IV SOLN
0.1000 ug/kg/h | INTRAVENOUS | Status: DC
  Administered 2015-10-28: .4 ug/kg/h via INTRAVENOUS
  Administered 2015-10-28: 0.7 ug/kg/h via INTRAVENOUS
  Filled 2015-10-25: qty 100

## 2015-10-25 MED ORDER — EPINEPHRINE HCL 1 MG/ML IJ SOLN
0.0000 ug/min | INTRAVENOUS | Status: DC
  Filled 2015-10-25: qty 4

## 2015-10-25 MED ORDER — CHLORHEXIDINE GLUCONATE 4 % EX LIQD: 1.0000 "application " | Freq: Once | CUTANEOUS | Status: DC

## 2015-10-25 MED ORDER — ASPIRIN EC 81 MG PO TBEC
81.0000 mg | DELAYED_RELEASE_TABLET | Freq: Every day | ORAL | Status: DC
  Administered 2015-10-26 – 2015-10-27 (×2): 81 mg via ORAL
  Filled 2015-10-25 (×2): qty 1

## 2015-10-25 MED ORDER — FENTANYL CITRATE (PF) 100 MCG/2ML IJ SOLN
50.0000 ug | INTRAMUSCULAR | Status: DC | PRN
  Administered 2015-10-25: 50 ug via INTRAVENOUS
  Filled 2015-10-25: qty 2

## 2015-10-25 MED ORDER — ALBUTEROL SULFATE (2.5 MG/3ML) 0.083% IN NEBU: 2.5000 mg | INHALATION_SOLUTION | RESPIRATORY_TRACT | Status: DC | PRN

## 2015-10-25 MED ORDER — SODIUM CHLORIDE 0.9 % IV SOLN
INTRAVENOUS | Status: DC
  Filled 2015-10-25: qty 30

## 2015-10-25 MED ORDER — MIDAZOLAM HCL 10 MG/2ML IJ SOLN
INTRAMUSCULAR | Status: AC
  Filled 2015-10-25: qty 2

## 2015-10-25 MED ORDER — POTASSIUM CHLORIDE 2 MEQ/ML IV SOLN
80.0000 meq | INTRAVENOUS | Status: DC
  Filled 2015-10-25: qty 40

## 2015-10-25 MED ORDER — SODIUM CHLORIDE 0.9 % IV SOLN
INTRAVENOUS | Status: DC
  Filled 2015-10-25: qty 2.5

## 2015-10-25 MED ORDER — HYDROCODONE-ACETAMINOPHEN 5-325 MG PO TABS: 1.0000 | ORAL_TABLET | ORAL | Status: DC | PRN

## 2015-10-25 MED ORDER — DEXTROSE 5 % IV SOLN
1.5000 g | INTRAVENOUS | Status: DC
  Administered 2015-10-28: 1.5 g via INTRAVENOUS
  Administered 2015-10-28: .75 g via INTRAVENOUS
  Filled 2015-10-25: qty 1.5

## 2015-10-25 MED ORDER — IOHEXOL 350 MG/ML SOLN
50.0000 mL | Freq: Once | INTRAVENOUS | Status: AC | PRN
  Administered 2015-10-25: 50 mL via INTRAVENOUS

## 2015-10-25 MED ORDER — DIAZEPAM 5 MG PO TABS
5.0000 mg | ORAL_TABLET | ORAL | Status: DC | PRN
Start: 2015-10-25 — End: 2015-10-28

## 2015-10-25 MED ORDER — DEXTROSE 5 % IV SOLN
30.0000 ug/min | INTRAVENOUS | Status: DC
  Filled 2015-10-25: qty 2

## 2015-10-25 MED ORDER — BISACODYL 5 MG PO TBEC: 5.0000 mg | DELAYED_RELEASE_TABLET | Freq: Once | ORAL | Status: DC

## 2015-10-25 MED ORDER — PLASMA-LYTE 148 IV SOLN
INTRAVENOUS | Status: DC
  Filled 2015-10-25: qty 2.5

## 2015-10-25 MED ORDER — LABETALOL HCL 5 MG/ML IV SOLN
10.0000 mg | INTRAVENOUS | Status: DC | PRN
  Administered 2015-10-25 – 2015-10-26 (×6): 10 mg via INTRAVENOUS
  Filled 2015-10-25 (×7): qty 4

## 2015-10-25 MED ORDER — MAGNESIUM HYDROXIDE 400 MG/5ML PO SUSP: 30.0000 mL | Freq: Every day | ORAL | Status: DC | PRN

## 2015-10-25 MED ORDER — MAGNESIUM SULFATE 50 % IJ SOLN
40.0000 meq | INTRAMUSCULAR | Status: DC
  Filled 2015-10-25: qty 10

## 2015-10-25 MED ORDER — GUAIFENESIN ER 600 MG PO TB12
600.0000 mg | ORAL_TABLET | Freq: Two times a day (BID) | ORAL | Status: DC
  Administered 2015-10-25 – 2015-10-27 (×5): 600 mg via ORAL
  Filled 2015-10-25 (×5): qty 1

## 2015-10-25 MED ORDER — DOCUSATE SODIUM 100 MG PO CAPS
100.0000 mg | ORAL_CAPSULE | Freq: Two times a day (BID) | ORAL | Status: DC
  Administered 2015-10-25 – 2015-10-27 (×5): 100 mg via ORAL
  Filled 2015-10-25 (×5): qty 1

## 2015-10-25 MED ORDER — METOPROLOL TARTRATE 25 MG PO TABS
25.0000 mg | ORAL_TABLET | Freq: Two times a day (BID) | ORAL | Status: DC
  Administered 2015-10-25 – 2015-10-26 (×2): 25 mg via ORAL
  Filled 2015-10-25 (×2): qty 1

## 2015-10-25 MED ORDER — SODIUM CHLORIDE 0.9 % IV SOLN
Freq: Once | INTRAVENOUS | Status: AC
  Administered 2015-10-25: 16:00:00 via INTRAVENOUS

## 2015-10-25 MED ORDER — TEMAZEPAM 15 MG PO CAPS
15.0000 mg | ORAL_CAPSULE | Freq: Once | ORAL | Status: AC | PRN
  Administered 2015-10-25: 15 mg via ORAL
  Filled 2015-10-25: qty 1

## 2015-10-25 MED ORDER — CLOPIDOGREL BISULFATE 75 MG PO TABS: 75.0000 mg | ORAL_TABLET | Freq: Every day | ORAL | Status: DC

## 2015-10-25 MED ORDER — IOHEXOL 350 MG/ML SOLN
80.0000 mL | Freq: Once | INTRAVENOUS | Status: AC | PRN
  Administered 2015-10-25: 80 mL via INTRAVENOUS

## 2015-10-25 MED ORDER — ACETAMINOPHEN 325 MG PO TABS
650.0000 mg | ORAL_TABLET | Freq: Four times a day (QID) | ORAL | Status: DC | PRN
  Administered 2015-10-27 (×2): 650 mg via ORAL
  Filled 2015-10-25 (×2): qty 2

## 2015-10-25 MED ORDER — PROPOFOL 10 MG/ML IV BOLUS
INTRAVENOUS | Status: AC
  Filled 2015-10-25: qty 20

## 2015-10-25 MED ORDER — ASPIRIN EC 81 MG PO TBEC: 81.0000 mg | DELAYED_RELEASE_TABLET | Freq: Every day | ORAL | Status: DC

## 2015-10-25 NOTE — ED Notes (Signed)
Patient given a 1/3 cup of ice water per Susy Frizzle, [employer] fundraiser; mother at bedside

## 2015-10-25 NOTE — ED Provider Notes (Signed)
CSN: [id number]     Arrival date & time 10/25/15  1134 History   First MD Initiated Contact with Patient 10/25/15 1253     Chief Complaint  Patient presents with  . Chest Pain  . Loss of Vision     (Consider location/radiation/quality/duration/timing/severity/associated sxs/prior Treatment) HPI Comments: 55 year old male with history of tobacco abuse, high blood pressure, aortic dissection, cocaine abuse presents with vision changes and balance issues. Patient had vomiting last night and woke up with right chest discomfort and decreased vision left lateral visual field. No history of similar. Patient denies any stroke history in the past. Symptoms constant.  Patient is a 56 y.o. male presenting with chest pain. The history is provided by the patient.  Chest Pain Associated symptoms: no abdominal pain, no back pain, no fever, no headache, no shortness of breath, not vomiting and no weakness     Past Medical History  Diagnosis Date  . Malignant hypertension Dx 2016  . Aortic dissection, thoracic (HCC)     Needs follow up with vascular surgeon in 6 mos.    Past Surgical History  Procedure Laterality Date  . Rectal abscess     Family History  Problem Relation Age of Onset  . Diabetes Father   . Heart disease Father   . Dementia Father    Social History  Substance Use Topics  . Smoking status: Current Every Day Smoker    Types: Cigarettes  . Smokeless tobacco: None  . Alcohol Use: No    Review of Systems  Constitutional: Negative for fever and chills.  HENT: Negative for congestion.   Eyes: Positive for visual disturbance.  Respiratory: Negative for shortness of breath.   Cardiovascular: Positive for chest pain.  Gastrointestinal: Negative for vomiting and abdominal pain.  Genitourinary: Negative for dysuria and flank pain.  Musculoskeletal: Negative for back pain, neck pain and neck stiffness.  Skin: Negative for rash.  Neurological: Negative for weakness,  light-headedness and headaches.      Allergies  Review of patient's allergies indicates no known allergies.  Home Medications   Prior to Admission medications   Medication Sig Start Date End Date Taking? Authorizing Provider  acetaminophen-codeine (TYLENOL #3) 300-30 MG per tablet Take 1 tablet by mouth every 8 (eight) hours as needed for moderate pain. Patient not taking: Reported on 03/26/2015 03/01/15   Dessa Phi, MD  ergocalciferol (DRISDOL) 50000 UNITS capsule Take 1 capsule (50,000 Units total) by mouth once a week. Patient not taking: Reported on 03/26/2015 01/28/15   Jaclyn Shaggy, MD  losartan (COZAAR) 100 MG tablet Take 1 tablet (100 mg total) by mouth daily. Patient not taking: Reported on 10/25/2015 02/17/15   Gaylord Shih, MD  metoprolol tartrate (LOPRESSOR) 25 MG tablet Take 1 tablet (25 mg total) by mouth 2 (two) times daily. Patient not taking: Reported on 10/25/2015 02/17/15   Gaylord Shih, MD   BP 130/64 mmHg  Pulse 84  Temp(Src) 98 F (36.7 C) (Oral)  Resp 22  Ht  (1.702 m)  Wt 240 lb (108.863 kg)  BMI 37.58 kg/m2  SpO2 93% Physical Exam  Constitutional: He is oriented to person, place, and time. He appears well-developed and well-nourished.  HENT:  Head: Normocephalic and atraumatic.  Eyes: Conjunctivae are normal. Right eye exhibits no discharge. Left eye exhibits no discharge.  Neck: Normal range of motion. Neck supple. No tracheal deviation present.  Cardiovascular: Normal rate and regular rhythm.   Patient has decreased pulse right wrist versus left  Pulmonary/Chest: Effort normal and breath sounds normal.  Abdominal: Soft. He exhibits no distension. There is no tenderness. There is no guarding.  Musculoskeletal: He exhibits no edema.  Neurological: He is alert and oriented to person, place, and time. GCS eye subscore is 4. GCS verbal subscore is 5. GCS motor subscore is 6.  Patient has decreased visual acuity left lateral visual field. No obvious  facial droop. Patient has equal strength upper lower extremities. Mild dysmetria left finger to nose.  Skin: Skin is warm. No rash noted.  Psychiatric: He has a normal mood and affect.  Nursing note and vitals reviewed.   ED Course  Procedures (including critical care time) CRITICAL CARE Performed by: Enid Skeens   Total critical care time: 35 minutes  Critical care time was exclusive of separately billable procedures and treating other patients.  Critical care was necessary to treat or prevent imminent or life-threatening deterioration.  Critical care was time spent personally by me on the following activities: development of treatment plan with patient and/or surrogate as well as nursing, discussions with consultants, evaluation of patient's response to treatment, examination of patient, obtaining history from patient or surrogate, ordering and performing treatments and interventions, ordering and review of laboratory studies, ordering and review of radiographic studies, pulse oximetry and re-evaluation of patient's condition.  Labs Review Labs Reviewed  BASIC METABOLIC PANEL - Abnormal; Notable for the following:    Glucose, Bld 137 (*)    Creatinine, Ser 1.26 (*)    All other components within normal limits  CBC - Abnormal; Notable for the following:    RBC 5.84 (*)    All other components within normal limits  URINALYSIS, ROUTINE W REFLEX MICROSCOPIC (NOT AT [facility]) - Abnormal; Notable for the following:    Specific Gravity, Urine >1.046 (*)    Hgb urine dipstick TRACE (*)    All other components within normal limits  URINE RAPID DRUG SCREEN, HOSP PERFORMED - Abnormal; Notable for the following:    Cocaine POSITIVE (*)    All other components within normal limits  URINE MICROSCOPIC-ADD ON - Abnormal; Notable for the following:    Squamous Epithelial / LPF 0-5 (*)    Bacteria, UA FEW (*)    Crystals HIPPURIC ACID CRYSTALS (*)    All other components within normal limits   CBG MONITORING, ED  I-STAT TROPOININ, ED    Imaging Review Ct Angio Head W/cm &/or Wo Cm  10/25/2015  CLINICAL DATA:  Subclavian artery stenosis. Embolic stroke. Dizziness and left arm weakness. EXAM: CT ANGIOGRAPHY HEAD AND NECK TECHNIQUE: Multidetector CT imaging of the head and neck was performed using the standard protocol during bolus administration of intravenous contrast. Multiplanar CT image reconstructions and MIPs were obtained to evaluate the vascular anatomy. Carotid stenosis measurements (when applicable) are obtained utilizing NASCET criteria, using the distal internal carotid diameter as the denominator. CONTRAST:  50mL OMNIPAQUE IOHEXOL 350 MG/ML SOLN COMPARISON:  Head CT earlier same day. FINDINGS: CTA NECK Aortic arch: See results of chest CT for full description of extensive aortic dissection. Right carotid system: The dissection extends into the right common carotid artery and affects the majority that vessel all way to the arch. Minimal diameter is 2.5 mm. This indicates a 66% stenosis. The bifurcation is widely patent. The cervical internal carotid artery is widely patent. Left carotid system: Left common carotid artery is widely patent to the bifurcation. The carotid bifurcation shows mild atherosclerotic change but there is no stenosis or irregularity. Cervical  internal carotid artery is widely patent. Vertebral arteries:The dissection involves the right subclavian artery. The right vertebral artery probably arises from the false limb an. There is diminished if any flow within the right vertebral artery proximally. There is reconstitution via cervical collaterals more distally with the vessel being patent at the foramen magnum level. The dissection extends into the left subclavian artery proximally, but does not involve the origin of the left vertebral artery which is widely patent. This vessel is widely patent through the cervical region to the foramen magnum. Skeleton: Mild  spondylosis Other neck: No significant soft tissue lesion. CTA HEAD Anterior circulation: The internal carotid arteries are widely patent through the siphon region. There is atherosclerosis in that region but no stenosis greater than 30%. Supra clinoid internal carotid arteries are widely patent. The anterior and middle cerebral vessels are patent without proximal stenosis, aneurysm or vascular malformation. No large vessel anterior circulation infarction is identified. Posterior circulation: Both vertebral arteries show flow at the foramen magnum and both contribute to the basilar. There is no basilar stenosis. Basilar tip is bulbous, but there is no berry aneurysm. Superior cerebellar and posterior cerebral arteries originate from this bulbous region. There is flow in both posterior cerebral artery territories, including on the right were there is infarction by CT. Venous sinuses: Patent and normal Anatomic variants: None significant Delayed phase: No abnormal enhancement IMPRESSION: Extensive dissection of the thoracic aorta fully described at chest CTA. The dissection extends into the right common carotid artery all the way to the bifurcation. Stenosis is 66%. The bifurcation is widely patent and the cervical internal carotid artery on the right is widely patent. Left common carotid artery, carotid bifurcation and cervical ICA are widely patent. No flow demonstrated in the proximal right vertebral artery, probably because of the vessel originating from the false lumen. There is distal reconstitution by cervical collaterals. Left vertebral artery is widely patent. No intracranial large or medium vessel occlusion demonstrated. This includes flow present within the right PCA territory at this time. Electronically Signed   By: Paulina Fusi M.D.   On: 10/25/2015 14:40   Ct Head Wo Contrast  10/25/2015  CLINICAL DATA:  Peripheral vision loss. Dizziness and headache. Peripheral vision loss in left eye, dizziness,  and headache. Pt states he has decreased sensation on right side and feels off balance. Hx HTN. EXAM: CT HEAD WITHOUT CONTRAST TECHNIQUE: Contiguous axial images were obtained from the base of the skull through the vertex without intravenous contrast. COMPARISON:  None. FINDINGS: There is cortical hypodensity in the medial RIGHT occipital lobe measuring 4 cm by 2 cm. There is mild mass effect to the posterior interhemispheric fissure which is slightly bowed leftward. Linear rounded hypodense lesion in the RIGHT cerebellum on image 11, series 2 which is much smaller measuring 1.1 by 0.6 cm. Adjacent 0.7 mm lesion on image 10. No intracranial hemorrhage. Ventricles are normal volume. There is a subtle focal enlargement of the RIGHT ventricular atria on image 17, series 2. IMPRESSION: 1. Hypodense cortical lesion with mild edema / mass effect in the medial RIGHT occipital lobe is most suggestive of a cortical infarction. Cannot completely exclude a a parenchymal mass lesion. Recommend brain MRI for further evaluation. 2. Small lesions in the RIGHT cerebellum most consistent with age indeterminate infarction. 3. No intracranial hemorrhage.  No hydrocephalus. Findings conveyed toJosh Zavits on 10/25/2015  at12:31. Electronically Signed   By: Genevive Bi M.D.   On: 10/25/2015 12:32   Ct Angio Neck  W/cm &/or Wo/cm  10/25/2015  CLINICAL DATA:  Subclavian artery stenosis. Embolic stroke. Dizziness and left arm weakness. EXAM: CT ANGIOGRAPHY HEAD AND NECK TECHNIQUE: Multidetector CT imaging of the head and neck was performed using the standard protocol during bolus administration of intravenous contrast. Multiplanar CT image reconstructions and MIPs were obtained to evaluate the vascular anatomy. Carotid stenosis measurements (when applicable) are obtained utilizing NASCET criteria, using the distal internal carotid diameter as the denominator. CONTRAST:  50mL OMNIPAQUE IOHEXOL 350 MG/ML SOLN COMPARISON:  Head CT  earlier same day. FINDINGS: CTA NECK Aortic arch: See results of chest CT for full description of extensive aortic dissection. Right carotid system: The dissection extends into the right common carotid artery and affects the majority that vessel all way to the arch. Minimal diameter is 2.5 mm. This indicates a 66% stenosis. The bifurcation is widely patent. The cervical internal carotid artery is widely patent. Left carotid system: Left common carotid artery is widely patent to the bifurcation. The carotid bifurcation shows mild atherosclerotic change but there is no stenosis or irregularity. Cervical internal carotid artery is widely patent. Vertebral arteries:The dissection involves the right subclavian artery. The right vertebral artery probably arises from the false limb an. There is diminished if any flow within the right vertebral artery proximally. There is reconstitution via cervical collaterals more distally with the vessel being patent at the foramen magnum level. The dissection extends into the left subclavian artery proximally, but does not involve the origin of the left vertebral artery which is widely patent. This vessel is widely patent through the cervical region to the foramen magnum. Skeleton: Mild spondylosis Other neck: No significant soft tissue lesion. CTA HEAD Anterior circulation: The internal carotid arteries are widely patent through the siphon region. There is atherosclerosis in that region but no stenosis greater than 30%. Supra clinoid internal carotid arteries are widely patent. The anterior and middle cerebral vessels are patent without proximal stenosis, aneurysm or vascular malformation. No large vessel anterior circulation infarction is identified. Posterior circulation: Both vertebral arteries show flow at the foramen magnum and both contribute to the basilar. There is no basilar stenosis. Basilar tip is bulbous, but there is no berry aneurysm. Superior cerebellar and posterior  cerebral arteries originate from this bulbous region. There is flow in both posterior cerebral artery territories, including on the right were there is infarction by CT. Venous sinuses: Patent and normal Anatomic variants: None significant Delayed phase: No abnormal enhancement IMPRESSION: Extensive dissection of the thoracic aorta fully described at chest CTA. The dissection extends into the right common carotid artery all the way to the bifurcation. Stenosis is 66%. The bifurcation is widely patent and the cervical internal carotid artery on the right is widely patent. Left common carotid artery, carotid bifurcation and cervical ICA are widely patent. No flow demonstrated in the proximal right vertebral artery, probably because of the vessel originating from the false lumen. There is distal reconstitution by cervical collaterals. Left vertebral artery is widely patent. No intracranial large or medium vessel occlusion demonstrated. This includes flow present within the right PCA territory at this time. Electronically Signed   By: Paulina Fusi M.D.   On: 10/25/2015 14:40   Ct Angio Abdomen W/cm &/or Wo Contrast  10/25/2015  CLINICAL DATA:  Known there dissection. History is noted the clot. Screw acute emboli. Dizziness, weakness. EXAM: CT ANGIOGRAPHY CHEST, ABDOMEN TECHNIQUE: Multidetector CT imaging through the chest, abdomen was performed using the standard protocol during bolus administration of intravenous contrast.  Multiplanar reconstructed images and MIPs were obtained and reviewed to evaluate the vascular anatomy. CONTRAST:  80mL OMNIPAQUE IOHEXOL 350 MG/ML SOLN COMPARISON:  CTA neck same day, is CT thorax 423 16 FINDINGS: CTA CHEST FINDINGS Mediastinum/Nodes: Previously described Type B Aortic dissection has progressed proximally and extends through the ascending thoracic aorta to root of the aorta (type A dissection). The dissection flap extending from the ascending aorta extends into the great vessels  involves the brachycephalic artery. The dissection flap extends through the brachiocephalic artery into the RIGHT subclavian artery and superiorly into the carotid artery. A short dissection flap extends into the LEFT subclavian artery which is ectatic. The great vessel involvement is better depicted on the CTA of the neck. RIGHT subclavian artery is poorly perfused. The dissection flap extends into the abdominal aorta. Lungs/Pleura: Bibasilar atelectasis. No pulmonary infarction. No pneumothorax. Review of the MIP images confirms the above findings. CTA ABDOMEN AND PELVIS FINDINGS Dissection flap extends into the abdominal aorta. The true lumen supplies the celiac trunk and the SMA as well as the RIGHT renal artery. The false lumen supplies the LEFT renal artery. The flap extends into the LEFT common iliac artery. Both kidneys perfuse evenly an uniformly. Lower chest: Lung bases are clear. Hepatobiliary: No focal hepatic lesion. No biliary duct dilatation. Gallbladder is normal. Common bile duct is normal. Pancreas: Pancreas is normal. No ductal dilatation. No pancreatic inflammation. Spleen: Normal spleen Adrenals/urinary tract: Adrenal glands and kidneys are normal. Proximal ureters normal. Stomach/Bowel: Stomach, small bowel, appendix, and cecum are normal. Limited view of the colon are unremarkable. Vascular/Lymphatic: Abdominal aorta is normal caliber. There is no retroperitoneal or periportal lymphadenopathy. No pelvic lymphadenopathy. Other: No free fluid. Musculoskeletal: No aggressive osseous lesion. Review of the MIP images confirms the above findings. IMPRESSION: Chest Impression: 1. Type A aortic dissection has progressed from type B dissection on 01/02/2015. 2. Dissection extends into the great vessels involving the brachiocephalic artery, RIGHT carotid artery, RIGHT subclavian artery. RIGHT subclavian artery is poorly perfused. 3. Short dissection flap extends into the LEFT subclavian artery. Abdomen  / Pelvis Impression: 1. Type A dissection extends through the abdominal aorta to the LEFT common iliac artery. 2. The kidneys enhance symmetrically. SMA and celiac artery perfuse from the through the true lumen. Critical Value/emergent results were called by telephone at the time of interpretation on 10/25/2015 at 2 :50 pm to Dr. Blane Ohara , who verbally acknowledged these results. Electronically Signed   By: Genevive Bi M.D.   On: 10/25/2015 15:16   Ct Angio Chest Aorta W/cm &/or Wo/cm  10/25/2015  CLINICAL DATA:  Known there dissection. History is noted the clot. Screw acute emboli. Dizziness, weakness. EXAM: CT ANGIOGRAPHY CHEST, ABDOMEN TECHNIQUE: Multidetector CT imaging through the chest, abdomen was performed using the standard protocol during bolus administration of intravenous contrast. Multiplanar reconstructed images and MIPs were obtained and reviewed to evaluate the vascular anatomy. CONTRAST:  80mL OMNIPAQUE IOHEXOL 350 MG/ML SOLN COMPARISON:  CTA neck same day, is CT thorax 423 16 FINDINGS: CTA CHEST FINDINGS Mediastinum/Nodes: Previously described Type B Aortic dissection has progressed proximally and extends through the ascending thoracic aorta to root of the aorta (type A dissection). The dissection flap extending from the ascending aorta extends into the great vessels involves the brachycephalic artery. The dissection flap extends through the brachiocephalic artery into the RIGHT subclavian artery and superiorly into the carotid artery. A short dissection flap extends into the LEFT subclavian artery which is ectatic. The great vessel involvement  is better depicted on the CTA of the neck. RIGHT subclavian artery is poorly perfused. The dissection flap extends into the abdominal aorta. Lungs/Pleura: Bibasilar atelectasis. No pulmonary infarction. No pneumothorax. Review of the MIP images confirms the above findings. CTA ABDOMEN AND PELVIS FINDINGS Dissection flap extends into the  abdominal aorta. The true lumen supplies the celiac trunk and the SMA as well as the RIGHT renal artery. The false lumen supplies the LEFT renal artery. The flap extends into the LEFT common iliac artery. Both kidneys perfuse evenly an uniformly. Lower chest: Lung bases are clear. Hepatobiliary: No focal hepatic lesion. No biliary duct dilatation. Gallbladder is normal. Common bile duct is normal. Pancreas: Pancreas is normal. No ductal dilatation. No pancreatic inflammation. Spleen: Normal spleen Adrenals/urinary tract: Adrenal glands and kidneys are normal. Proximal ureters normal. Stomach/Bowel: Stomach, small bowel, appendix, and cecum are normal. Limited view of the colon are unremarkable. Vascular/Lymphatic: Abdominal aorta is normal caliber. There is no retroperitoneal or periportal lymphadenopathy. No pelvic lymphadenopathy. Other: No free fluid. Musculoskeletal: No aggressive osseous lesion. Review of the MIP images confirms the above findings. IMPRESSION: Chest Impression: 1. Type A aortic dissection has progressed from type B dissection on 01/02/2015. 2. Dissection extends into the great vessels involving the brachiocephalic artery, RIGHT carotid artery, RIGHT subclavian artery. RIGHT subclavian artery is poorly perfused. 3. Short dissection flap extends into the LEFT subclavian artery. Abdomen / Pelvis Impression: 1. Type A dissection extends through the abdominal aorta to the LEFT common iliac artery. 2. The kidneys enhance symmetrically. SMA and celiac artery perfuse from the through the true lumen. Critical Value/emergent results were called by telephone at the time of interpretation on 10/25/2015 at 2 :50 pm to Dr. Blane Ohara , who verbally acknowledged these results. Electronically Signed   By: Genevive Bi M.D.   On: 10/25/2015 15:16   I have personally reviewed and evaluated these images and lab results as part of my medical decision-making.   EKG Interpretation   Date/Time:  Monday  October 25 2015 11:48:54 EST Ventricular Rate:  90 PR Interval:  158 QRS Duration: 80 QT Interval:  362 QTC Calculation: 442 R Axis:   -37 Text Interpretation:  Normal sinus rhythm Possible Left atrial enlargement  Left axis deviation T wave inversions and non specific ST overall similar  to previous Confirmed by Shali Vesey  MD, Ancel Easler ([id number]) on 10/25/2015 1:09:26 PM      MDM   Final diagnoses:  Subclavian arterial stenosis (HCC)  Stroke due to embolism (HCC)  Subclavian arterial stenosis (HCC)  Stroke due to embolism (HCC)  Subclavian arterial stenosis (HCC)  Stroke due to embolism [facility])  Aortic dissection  Patient with known dissection history presents with stroke symptoms. Patient out of any TPA window. CT scan reviewed myself and discussed with radiologist concerning for stroke unsure acuity. Discussed with neurologist immediately, CT scan was done in triage. With blood pressure differential CT scan of chest and abdomen ordered as well.  Holding on Plavix at this time. CT scan results reviewed with radiology and neurology concerning for both stroke and worsening dissection type A and type B. Complicated presentation. Discussed with vascular surgery who recommended cardiovascular surgery consult. Updated neurology. Discussed with cardio vascular surgeon who will review images and evaluate the patient.  Plan for admission final details pending.  The patients results and plan were reviewed and discussed.   Any x-rays performed were independently reviewed by myself.   Differential diagnosis were considered with the presenting HPI.  Medications  aspirin EC tablet 81 mg (0 mg Oral Hold 10/25/15 1523)  iohexol (OMNIPAQUE) 350 MG/ML injection 50 mL (50 mLs Intravenous Contrast Given 10/25/15 1325)  iohexol (OMNIPAQUE) 350 MG/ML injection 80 mL (80 mLs Intravenous Contrast Given 10/25/15 1326)  0.9 %  sodium chloride infusion ( Intravenous New Bag/Given 10/25/15 1609)    Filed Vitals:    10/25/15 1430 10/25/15 1445 10/25/15 1530 10/25/15 1545  BP: 140/76 135/81 120/68 130/64  Pulse: 69 73 85 84  Temp:      TempSrc:      Resp: 19 14 22 22   Height:      Weight:      SpO2: 96% 94% 90% 93%    Final diagnoses:  Subclavian arterial stenosis (HCC)  Stroke due to embolism (HCC)  Subclavian arterial stenosis (HCC)  Stroke due to embolism (HCC)  Subclavian arterial stenosis (HCC)  Stroke due to embolism [facility])    Admission/ observation were discussed with the admitting physician, patient and/or family and they are comfortable with the plan.     Blane Ohara, MD 10/25/15 [phone]

## 2015-10-25 NOTE — ED Notes (Signed)
Zavitz, MD notified of low pressure in right arm

## 2015-10-25 NOTE — Progress Notes (Addendum)
Day of Surgery Procedure(s) (LRB): AORTIC DISSECTION REPAIR WITH CIRC ARREST (N/A) TRANSESOPHAGEAL ECHOCARDIOGRAM (TEE) (N/A) Subjective: Patient examined and CTA reviewed with radiologist 55 yo cocaine user w/ hx type B dissection April 016, htn Now presents with no chest pain but L visual field deficit and R shoulder discomfort with poor pulse in RUE only. CTA shows new tear in ascending aorta ( used cocaine last pm ) with tear obstructing flow to R carotid and R subclavian. No pericardial effusion\ Head CT shows R occipital CVA and occlusion of R vertebral artery  Objective: Vital signs in last 24 hours: Temp:  [98 F (36.7 C)] 98 F (36.7 C) (02/13 1148) Pulse Rate:  [69-103] 84 (02/13 1545) Cardiac Rhythm:  [-] Normal sinus rhythm (02/13 1305) Resp:  [12-22] 22 (02/13 1545) BP: (73-159)/(32-84) 130/64 mmHg (02/13 1545) SpO2:  [90 %-100 %] 93 % (02/13 1545) Weight:  [240 lb (108.863 kg)] 240 lb (108.863 kg) (02/13 1148)  Hemodynamic parameters for last 24 hours:  stable  Intake/Output from previous day:   Intake/Output this shift:        Physical Exam  General: middle aged AA male NAD HEENT: Normocephalic pupils equal , dentition adequate Neck: Supple without JVD, adenopathy, or bruit Chest: Clear to auscultation, symmetrical breath sounds, no rhonchi, no tenderness             or deformity Cardiovascular: Regular rate and rhythm, no murmur, no gallop, peripheral pulses             palpable in all extremities EXCEPT RUE Abdomen:  Soft, nontender, no palpable mass or organomegaly Extremities: Warm, well-perfused, no clubbing cyanosis edema or tenderness,              no venous stasis changes of the legs Rectal/GU: Deferred Neuro  motor is normal and  symmetrical throughout. L hemianopsia Skin: Clean and dry without rash or ulceration  Kathlee Nations Trigt III 10/25/2015  tex  Lab Results:  Recent Labs  10/25/15 1150  WBC 10.1  HGB 15.5  HCT 48.1  PLT 218    BMET:  Recent Labs  10/25/15 1150  NA 138  K 3.7  CL 101  CO2 24  GLUCOSE 137*  BUN 17  CREATININE 1.26*  CALCIUM 9.1    PT/INR: No results for input(s): LABPROT, INR in the last 72 hours. ABG    Component Value Date/Time   TCO2 22 04/12/2009 1147   CBG (last 3)  No results for input(s): GLUCAP in the last 72 hours.  Assessment/Plan: S/P Procedure(s) (LRB): AORTIC DISSECTION REPAIR WITH CIRC ARREST (N/A) TRANSESOPHAGEAL ECHOCARDIOGRAM (TEE) (N/A)  pt with hx of type B dissection now with type A dissection and compromised flow R carotid system and R subclavian  Acute R occipital CVA with visual symptoms  Emerg high risk surgery for ascending aorta and innominate repair recommended to patient- he is deciding. Understands risk of further stroke vs death with emerg surgery  and risk of permanent vision deficit without emerg surgery.      Kathlee Nations Trigt III 10/25/2015

## 2015-10-25 NOTE — Anesthesia Preprocedure Evaluation (Addendum)
Anesthesia Evaluation  Patient identified by MRN, date of birth, ID band Patient awake    Reviewed: Allergy & Precautions, NPO status , Patient's Chart, lab work & pertinent test results  History of Anesthesia Complications Negative for: history of anesthetic complications  Airway Mallampati: III  TM Distance: >3 FB Neck ROM: Full    Dental  (+) Dental Advisory Given   Pulmonary Current Smoker,    breath sounds clear to auscultation       Cardiovascular hypertension, Pt. on medications and Pt. on home beta blockers (-) angina+ Peripheral Vascular Disease (h/o Type B descending, now with extension into great vessels and ascending aorta)   Rhythm:Regular Rate:Tachycardia  Malignant HTN, cocaine use recently Aortic Dissection 10/26/15 ECHO: EF 60-65%, valves OK   Neuro/Psych CVA (vision)    GI/Hepatic GERD  ,(+)     substance abuse  alcohol use and cocaine use,   Endo/Other  Morbid obesity  Renal/GU negative Renal ROS     Musculoskeletal   Abdominal (+) + obese,   Peds  Hematology negative hematology ROS (+)   Anesthesia Other Findings   Reproductive/Obstetrics                         Anesthesia Physical Anesthesia Plan  ASA: IV  Anesthesia Plan: General   Post-op Pain Management:    Induction: Intravenous  Airway Management Planned: Oral ETT  Additional Equipment: Arterial line, PA Cath, TEE and Ultrasound Guidance Line Placement  Intra-op Plan: Utilization Of Total Body Hypothermia per surgeon request and Delibrate Circulatory arrest per surgeon request  Post-operative Plan: Post-operative intubation/ventilation  Informed Consent: I have reviewed the patients History and Physical, chart, labs and discussed the procedure including the risks, benefits and alternatives for the proposed anesthesia with the patient or authorized representative who has indicated his/her understanding  and acceptance.   Dental advisory given  Plan Discussed with: CRNA and Surgeon  Anesthesia Plan Comments: (Aortic dissection: plan routine monitors,  Aline, PA cath, GETA with TEE, circ arrest, post op ventilation)       Anesthesia Quick Evaluation

## 2015-10-25 NOTE — ED Notes (Signed)
Brought patient back to room, patient undressed, in gown, on monitor, continuous pulse oximetry and blood pressure cuff 

## 2015-10-25 NOTE — ED Notes (Addendum)
Pt states when he woke up this morning he felt a discomfort in the right side of his chest. Pt states when he stood up he felt off balance and felt like he had blurry vision in his peripheral vision in his left eye. Pt reports no dizziness. Pt has clear speech and equal grip strengths. Pt reports decreased sensation on right side. Pt is alert and ox4.

## 2015-10-25 NOTE — ED Notes (Signed)
Susy Frizzle, RN and Jodi Mourning, MD present in room

## 2015-10-25 NOTE — Progress Notes (Addendum)
CT Surgery  Patient declines emergency surgery for new dx type A dissection with L visual field cut OR team notified and will stand down   Will admit to ICU for monitoring and BP control No anticoagulation except 81 ASA
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
## 2015-10-26 ENCOUNTER — Other Ambulatory Visit: Payer: Self-pay | Admitting: *Deleted

## 2015-10-26 ENCOUNTER — Ambulatory Visit (HOSPITAL_COMMUNITY): Payer: Medicaid Other

## 2015-10-26 ENCOUNTER — Inpatient Hospital Stay (HOSPITAL_COMMUNITY): Payer: Medicaid Other

## 2015-10-26 DIAGNOSIS — I7100 Dissection of unspecified site of aorta: Secondary | ICD-10-CM

## 2015-10-26 DIAGNOSIS — I7101 Dissection of thoracic aorta: Secondary | ICD-10-CM

## 2015-10-26 DIAGNOSIS — F17200 Nicotine dependence, unspecified, uncomplicated: Secondary | ICD-10-CM

## 2015-10-26 DIAGNOSIS — I6330 Cerebral infarction due to thrombosis of unspecified cerebral artery: Secondary | ICD-10-CM | POA: Insufficient documentation

## 2015-10-26 DIAGNOSIS — I2510 Atherosclerotic heart disease of native coronary artery without angina pectoris: Secondary | ICD-10-CM

## 2015-10-26 DIAGNOSIS — I63431 Cerebral infarction due to embolism of right posterior cerebral artery: Secondary | ICD-10-CM

## 2015-10-26 DIAGNOSIS — I7103 Dissection of thoracoabdominal aorta: Secondary | ICD-10-CM

## 2015-10-26 DIAGNOSIS — I71019 Dissection of thoracic aorta, unspecified: Secondary | ICD-10-CM

## 2015-10-26 HISTORY — PX: TRANSTHORACIC ECHOCARDIOGRAM: SHX275

## 2015-10-26 LAB — DIC (DISSEMINATED INTRAVASCULAR COAGULATION)PANEL
D-Dimer, Quant: 7.7 ug/mL-FEU — ABNORMAL HIGH (ref 0.00–0.50)
Fibrinogen: 389 mg/dL (ref 204–475)
INR: 1.23 (ref 0.00–1.49)
Platelets: 184 10*3/uL (ref 150–400)
Prothrombin Time: 15.7 seconds — ABNORMAL HIGH (ref 11.6–15.2)
Smear Review: NONE SEEN
aPTT: 31 seconds (ref 24–37)

## 2015-10-26 LAB — PROTIME-INR
INR: 1.19 (ref 0.00–1.49)
Prothrombin Time: 15.2 seconds (ref 11.6–15.2)

## 2015-10-26 LAB — COMPREHENSIVE METABOLIC PANEL
ALT: 14 U/L — ABNORMAL LOW (ref 17–63)
AST: 16 U/L (ref 15–41)
Albumin: 2.9 g/dL — ABNORMAL LOW (ref 3.5–5.0)
Alkaline Phosphatase: 67 U/L (ref 38–126)
Anion gap: 12 (ref 5–15)
BUN: 12 mg/dL (ref 6–20)
CO2: 24 mmol/L (ref 22–32)
Calcium: 8.5 mg/dL — ABNORMAL LOW (ref 8.9–10.3)
Chloride: 102 mmol/L (ref 101–111)
Creatinine, Ser: 1.09 mg/dL (ref 0.61–1.24)
GFR calc Af Amer: >60 mL/min (ref >60)
GFR calc non Af Amer: >60 mL/min (ref >60)
Glucose, Bld: 98 mg/dL (ref 65–99)
Potassium: 3.9 mmol/L (ref 3.5–5.1)
Sodium: 138 mmol/L (ref 135–145)
Total Bilirubin: 0.7 mg/dL (ref 0.3–1.2)
Total Protein: 6 g/dL — ABNORMAL LOW (ref 6.5–8.1)

## 2015-10-26 LAB — CBC
HCT: 45.7 % (ref 39.0–52.0)
Hemoglobin: 14.1 g/dL (ref 13.0–17.0)
MCH: 25.5 pg — ABNORMAL LOW (ref 26.0–34.0)
MCHC: 30.9 g/dL (ref 30.0–36.0)
MCV: 82.5 fL (ref 78.0–100.0)
Platelets: 177 10*3/uL (ref 150–400)
RBC: 5.54 MIL/uL (ref 4.22–5.81)
RDW: 14 % (ref 11.5–15.5)
WBC: 7.8 10*3/uL (ref 4.0–10.5)

## 2015-10-26 LAB — APTT: aPTT: 32 seconds (ref 24–37)

## 2015-10-26 MED ORDER — METOPROLOL TARTRATE 50 MG PO TABS
50.0000 mg | ORAL_TABLET | Freq: Two times a day (BID) | ORAL | Status: DC
  Administered 2015-10-26 – 2015-10-27 (×3): 50 mg via ORAL
  Filled 2015-10-26 (×3): qty 1

## 2015-10-26 MED ORDER — HYDRALAZINE HCL 20 MG/ML IJ SOLN
10.0000 mg | INTRAMUSCULAR | Status: DC | PRN
  Administered 2015-10-26 – 2015-10-28 (×4): 10 mg via INTRAVENOUS
  Filled 2015-10-26 (×4): qty 1

## 2015-10-26 MED ORDER — ONDANSETRON HCL 4 MG/2ML IJ SOLN
INTRAMUSCULAR | Status: AC
  Filled 2015-10-26: qty 2

## 2015-10-26 MED ORDER — ONDANSETRON HCL 4 MG/2ML IJ SOLN: 4.0000 mg | Freq: Four times a day (QID) | INTRAMUSCULAR | Status: DC

## 2015-10-26 MED ORDER — ONDANSETRON HCL 4 MG/2ML IJ SOLN
4.0000 mg | Freq: Four times a day (QID) | INTRAMUSCULAR | Status: DC | PRN
  Administered 2015-10-26: 4 mg via INTRAVENOUS

## 2015-10-26 MED ORDER — VITAMIN B-1 100 MG PO TABS
100.0000 mg | ORAL_TABLET | Freq: Every day | ORAL | Status: DC
  Administered 2015-10-27: 100 mg via ORAL
  Filled 2015-10-26: qty 1

## 2015-10-26 MED ORDER — THIAMINE HCL 100 MG/ML IJ SOLN
100.0000 mg | Freq: Every day | INTRAMUSCULAR | Status: DC
  Administered 2015-10-26: 100 mg via INTRAVENOUS
  Filled 2015-10-26: qty 2

## 2015-10-26 MED ORDER — LABETALOL HCL 5 MG/ML IV SOLN
10.0000 mg | INTRAVENOUS | Status: DC | PRN
  Administered 2015-10-26 (×2): 10 mg via INTRAVENOUS

## 2015-10-26 MED ORDER — NICOTINE 14 MG/24HR TD PT24
14.0000 mg | MEDICATED_PATCH | Freq: Every day | TRANSDERMAL | Status: DC
  Administered 2015-10-26 – 2015-10-27 (×2): 14 mg via TRANSDERMAL
  Filled 2015-10-26 (×2): qty 1

## 2015-10-26 NOTE — Progress Notes (Signed)
1 Day Post-Op Procedure(s) (LRB): AORTIC DISSECTION REPAIR WITH CIRC ARREST (N/A) TRANSESOPHAGEAL ECHOCARDIOGRAM (TEE) (N/A) Subjective: Acute right occipital stroke with left hemianopsia related to aortic dissection involving the distal ascending aorta and aortic arch. Patient was recommended urgent repair with replacement of the ascending aorta and reconstruction of arch vessels but refused. He remains stable overnight. Blood pressure and vital signs are stable. Motor function is normal but vision deficit remains unchanged.  The patient understands that the recommended therapy for a aortic dissection involving the ascending aorta and arch-type A-involves surgical reconstruction with cardio point bypass and hypothermic circulatory arrest. He is considering having surgery during this hospitalization.  For now any anticoagulation for his stroke other than aspirin would be contraindicated because of the dissection. I discussed the situation in detail with both the patient and his mother and they understand the risks of surgery as well as the higher risks of further stroke and death if surgery is not performed.  Objective: Vital signs in last 24 hours: Temp:  [97.8 F (36.6 C)-99.5 F (37.5 C)] 99.5 F (37.5 C) (02/14 0819) Pulse Rate:  [63-103] 63 (02/14 1100) Cardiac Rhythm:  [-] Normal sinus rhythm (02/14 0721) Resp:  [11-22] 17 (02/14 1100) BP: (73-164)/(32-93) 139/72 mmHg (02/14 1100) SpO2:  [90 %-100 %] 96 % (02/14 1100) Weight:  [240 lb (108.863 kg)-244 lb 7.8 oz (110.9 kg)] 244 lb 7.8 oz (110.9 kg) (02/14 0500)  Hemodynamic parameters for last 24 hours:   stable  Intake/Output from previous day: 02/13 0701 - 02/14 0700 In: 1150 [P.O.:50; I.V.:1100] Out: 450 [Urine:450] Intake/Output this shift: Total I/O In: 615 [P.O.:240; I.V.:375] Out: 180 [Urine:180]       Exam    General- alert and comfortable   Lungs- clear without rales, wheezes   Cor- regular rate and rhythm, no  murmur , gallop   Abdomen- soft, non-tender   Extremities - warm, non-tender, minimal edema, no pulses and right upper extremity but right hand is warm with motor function intact   Neuro- oriented, appropriate, no focal weakness   Lab Results:  Recent Labs  10/25/15 1150 10/26/15 0540  WBC 10.1 7.8  HGB 15.5 14.1  HCT 48.1 45.7  PLT 218 184  177   BMET:  Recent Labs  10/25/15 1150 10/26/15 0540  NA 138 138  K 3.7 3.9  CL 101 102  CO2 24 24  GLUCOSE 137* 98  BUN 17 12  CREATININE 1.26* 1.09  CALCIUM 9.1 8.5*    PT/INR:  Recent Labs  10/26/15 0540  LABPROT 15.7*  15.2  INR 1.23  1.19   ABG    Component Value Date/Time   TCO2 22 04/12/2009 1147   CBG (last 3)  No results for input(s): GLUCAP in the last 72 hours.  Assessment/Plan: S/P Procedure(s) (LRB): AORTIC DISSECTION REPAIR WITH CIRC ARREST (N/A) TRANSESOPHAGEAL ECHOCARDIOGRAM (TEE) (N/A) Plan repair of type A aortic dissection this hospitalization if patient agrees   LOS: 1 day    Kathlee Nations Trigt III 10/26/2015

## 2015-10-26 NOTE — Progress Notes (Signed)
TCTS BRIEF SICU PROGRESS NOTE  Stable day although requiring frequent doses of labetalol for hypertension UOP adequate  Plan: Will increase metoprolol and add hydralazine as needed  Purcell Nails, MD 10/26/2015 7:30 PM

## 2015-10-26 NOTE — Care Management Note (Signed)
Case Management Note  Patient Details  Name: [name] MRN: [id number] Date of Birth: 07-14-1961  Subjective/Objective:     Recommend surgical replacement of his ascending aorta and reconstruction of arch vessels               Action/Plan:  Pt is from home with mom, pt completely independent prior to admit.  Pt will need assistance with PCP and medications prior discharge   Expected Discharge Date:                  Expected Discharge Plan:  Home/Self Care  In-House Referral:     Discharge planning Services  CM Consult  Post Acute Care Choice:    Choice offered to:     DME Arranged:    DME Agency:     HH Arranged:    HH Agency:     Status of Service:  In process, will continue to follow  Medicare Important Message Given:    Date Medicare IM Given:    Medicare IM give by:    Date Additional Medicare IM Given:    Additional Medicare Important Message give by:     If discussed at Long Length of Stay Meetings, dates discussed:    Additional Comments:  Cherylann Parr, RN 10/26/2015, 4:10 PM

## 2015-10-26 NOTE — Progress Notes (Signed)
STROKE TEAM PROGRESS NOTE   HISTORY OF PRESENT ILLNESS Kree A Knobel is an 55 y.o. male patient who presented to the ER with acute onset of left visual field loss. He woke up with the symptoms. He went to bed last night 10/25/2015  at about 12:30 AM (LKW), reportedly normal at that time. History of cocaine abuse, uses cocaine at least 2-3 days in a week. Reports using cocaine last night at about 10 P.m. Has a known history of abdominal aortic dissection. No recent follow-up. Poor compliance with prescription medicines at home. Does not take antiplatelets. Other than the left sided visual field loss, denies any other new neurological symptoms no weakness or numbness in the limbs or speech problems or gait abnormalities. While in the ER, he was noted to have significant difference in the blood pressure recordings to his left and right arms with significantly lower recording in the right. Patient was not administered TPA secondary to delay in arrival. He was admitted for further evaluation and treatment.   SUBJECTIVE (INTERVAL HISTORY) His mother is at the bedside.  Overall he feels his condition is unchanged. He still has left hemianopia. He has no new complaints. He is yet to decide on repair of his aortic dissection.   OBJECTIVE Temp:  [97.8 F (36.6 C)-99.5 F (37.5 C)] 99.5 F (37.5 C) (02/14 0819) Pulse Rate:  [66-103] 72 (02/14 1030) Cardiac Rhythm:  [-] Normal sinus rhythm (02/14 0721) Resp:  [11-22] 15 (02/14 1030) BP: (73-164)/(32-93) 115/70 mmHg (02/14 1030) SpO2:  [90 %-100 %] 93 % (02/14 1030) Weight:  [108.863 kg (240 lb)-110.9 kg (244 lb 7.8 oz)] 110.9 kg (244 lb 7.8 oz) (02/14 0500)  CBC:   Recent Labs Lab 10/25/15 1150 10/26/15 0540  WBC 10.1 7.8  HGB 15.5 14.1  HCT 48.1 45.7  MCV 82.4 82.5  PLT 218 184  177    Basic Metabolic Panel:   Recent Labs Lab 10/25/15 1150 10/26/15 0540  NA 138 138  K 3.7 3.9  CL 101 102  CO2 24 24  GLUCOSE 137* 98  BUN 17 12   CREATININE 1.26* 1.09  CALCIUM 9.1 8.5*    Lipid Panel: No results found for: CHOL, TRIG, HDL, CHOLHDL, VLDL, LDLCALC HgbA1c:  Lab Results  Component Value Date   HGBA1C 6.1* 01/02/2015   Urine Drug Screen:     Component Value Date/Time   LABOPIA NONE DETECTED 10/25/2015 1523   COCAINSCRNUR POSITIVE* 10/25/2015 1523   LABBENZ NONE DETECTED 10/25/2015 1523   AMPHETMU NONE DETECTED 10/25/2015 1523   THCU NONE DETECTED 10/25/2015 1523   LABBARB NONE DETECTED 10/25/2015 1523      IMAGING I have personally reviewed the radiological images below and agree with the radiology interpretations.  Ct Head Wo Contrast 10/25/2015  1. Hypodense cortical lesion with mild edema / mass effect in the medial RIGHT occipital lobe is most suggestive of a cortical infarction. Cannot completely exclude a a parenchymal mass lesion. Recommend brain MRI for further evaluation. 2. Small lesions in the RIGHT cerebellum most consistent with age indeterminate infarction. 3. No intracranial hemorrhage.  No hydrocephalus.   Ct Angio Neck W/cm &/or Wo/cm 10/25/2015  Extensive dissection of the thoracic aorta fully described at chest CTA. The dissection extends into the right common carotid artery all the way to the bifurcation. Stenosis is 66%. The bifurcation is widely patent and the cervical internal carotid artery on the right is widely patent. Left common carotid artery, carotid bifurcation and cervical ICA are  widely patent. No flow demonstrated in the proximal right vertebral artery, probably because of the vessel originating from the false lumen. There is distal reconstitution by cervical collaterals. Left vertebral artery is widely patent. No intracranial large or medium vessel occlusion demonstrated. This includes flow present within the right PCA territory at this time.   Ct Angio Abdomen W/cm &/or Wo Contrast Abdomen / Pelvis Impression: 1. Type A dissection extends through the abdominal aorta to the LEFT  common iliac artery. 2. The kidneys enhance symmetrically. SMA and celiac artery perfuse from the through the true lumen.   Ct Angio Chest Aorta W/cm &/or Wo/cm 10/25/2015  Chest Impression: 1. Type A aortic dissection has progressed from type B dissection on 01/02/2015. 2. Dissection extends into the great vessels involving the brachiocephalic artery, RIGHT carotid artery, RIGHT subclavian artery. RIGHT subclavian artery is poorly perfused. 3. Short dissection flap extends into the LEFT subclavian artery.   2D echo - pending  Physical exam  Temp:  [97.8 F (36.6 C)-99.8 F (37.7 C)] 99.8 F (37.7 C) (02/14 1524) Pulse Rate:  [63-92] 70 (02/14 1400) Resp:  [11-22] 20 (02/14 1400) BP: (115-164)/(56-93) 123/77 mmHg (02/14 1400) SpO2:  [91 %-99 %] 95 % (02/14 1400) Weight:  [244 lb 7.8 oz (110.9 kg)] 244 lb 7.8 oz (110.9 kg) (02/14 0500)  General - Well nourished, well developed, in no apparent distress.  Ophthalmologic - Fundi not visualized due to noncooperation.  Cardiovascular - Regular rate and rhythm.  Mental Status -  Level of arousal and orientation to time, place, and person were intact. Language including expression, naming, repetition, comprehension was assessed and found intact. Fund of Knowledge was assessed and was intact.  Cranial Nerves II - XII - II - right homonymous hemianopia. III, IV, VI - Extraocular movements intact. V - Facial sensation intact bilaterally. VII - Facial movement intact bilaterally. VIII - Hearing & vestibular intact bilaterally. X - Palate elevates symmetrically. XI - Chin turning & shoulder shrug intact bilaterally. XII - Tongue protrusion intact.  Motor Strength - The patient's strength was normal in all extremities and pronator drift was absent.  Bulk was normal and fasciculations were absent.   Motor Tone - Muscle tone was assessed at the neck and appendages and was normal.  Reflexes - The patient's reflexes were 1+ in all extremities  and he had no pathological reflexes.  Sensory - Light touch, temperature/pinprick were assessed and were symmetrical.    Coordination - The patient had normal movements in the hands and feet with no ataxia or dysmetria.  Tremor was absent.  Gait and Station - deferred due to safety concerns.   ASSESSMENT/PLAN Mr. DEVONTRE SIEDSCHLAG is a 55 y.o. male with history of AAA and cocaine use presenting with acute L hemianopia. He did not receive IV t-PA due to delay in arrival.   Stroke:  Right PCA infarct embolic secondary to aortic dissection large vessel disease source  Resultant  L Hemianopia   CTA head and neck R CCA dissection all way to bifurcation (from aortic dissection),  Right vertebral artery occlusion  2D Echo  pending   LDL ordered  HgbA1c ordered  SCDs for VTE prophylaxis Diet full liquid Room service appropriate?: Yes; Fluid consistency:: Thin  No antithrombotic prior to admission, now on aspirin 81 mg daily.   Patient counseled to be compliant with his antithrombotic medications  Ongoing aggressive stroke risk factor management  Therapy recommendations:  pending   Disposition:  pending   Aortic dissection  Progressed since 12/2014  Likely the cause of stroke and different BP in both arms  Pt is discussing with Dr. Donata Clay for the timing of surgery  BP goal 120 - 140  On ASA 81  Hypertension  Stable  BP goal 120-140 due to aortic dissection  Cocaine use  UDS positive for cocaine  Advised to stop  Tobacco abuse  Current smoker  Smoking cessation counseling provided  Pt is willing to quit  Other Stroke Risk Factors  Obesity, Body mass index is 38.28 kg/(m^2).   Other Active Problems    Hospital day # 1  Marvel Plan, MD PhD Stroke Neurology 10/26/2015 4:42 PM    To contact Stroke Continuity provider, please refer to [email or web address]. After hours, contact General Neurology

## 2015-10-26 NOTE — Progress Notes (Signed)
10/26/2015 1715 Dr. Cornelius Moras contacted in regards to continued elevated BP despite Prn labetalol given. Orders received ok to change prn labetalol order to Q41min PRN. Orders enacted. Pt. Updated on plan of care. Will continue to monitor patient.  Roshanna Cimino, Blanchard Kelch

## 2015-10-26 NOTE — Progress Notes (Signed)
  Echocardiogram 2D Echocardiogram has been performed.  [name] 10/26/2015, 2:34 PM

## 2015-10-26 NOTE — Consult Note (Signed)
[address]       [place] [address]             [phone]        ARIS EVEN [facility] Medical Record #[id number] Date of Birth: 26-May-1961  Referring: No ref. provider found Primary Care: Lora Paula, MD  Chief Complaint:   Loss of vision   patient examined, CT scans of chest and neck personally reviewed with radiologist  History of Present Illness:     55 year old AA male hypertensive smoker with history of alcohol and cocaine abuse presented to the ED with fairly sudden onset of left visual field deficit, headache, and some right shoulder discomfort. CT scan of the head shows a 4 cm right occipital CVA correlating with his visual deficit. The patient has a known history of type B descending thoracic aorta dissection which was diagnosed April 2016. He was recommended to take his  blood pressure medications and to stop cocaine and tobacco abuse.  He presented to the ED yesterday after using cocaine the night before. CT scan currently shows extension of the dissection to the ascending aorta above the aortic root involving the takeoff of the innominate artery and extending into the right carotid and right subclavian arteries. The right vertebral artery is occluded. The diameter of the ascending aorta prior to the dissection was approximate 4 cm. Previous echocardiogram last April showed no evidence of aortic valve disease with good LV systolic function.  The patient was recommended urgent-emergent cardiac surgery for replacement of his ascending aorta and reconstruction of the arch vessels but he declined. 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   Zubrod Score: At the time of surgery this patient's most appropriate activity status/level should be described as: []     0    Normal activity, no symptoms [x]     1    Restricted in physical strenuous activity but ambulatory, able to do out light work []     2     Ambulatory and capable of self care, unable to do work activities, up and about                 more than 50%  Of the time                            []     3    Only limited self care, in bed greater than 50% of waking hours []     4    Completely disabled, no self care, confined to bed or chair []     5    Moribund  Past Medical History  Diagnosis Date  . Malignant hypertension Dx 2016  . Aortic dissection, thoracic (HCC)     Needs follow up with vascular surgeon in 6 mos.     Past Surgical History  Procedure Laterality Date  . Rectal abscess      History  Smoking status  . Current Every Day Smoker  . Types: Cigarettes  Smokeless tobacco  . Not on file    History  Alcohol Use No    Social History   Social History  . Marital Status: Divorced    Spouse Name: N/A  . Number of Children: N/A  . Years of Education: N/A   Occupational History  . Not on file.   Social History Main Topics  . Smoking status: Current Every Day  Smoker    Types: Cigarettes  . Smokeless tobacco: Not on file  . Alcohol Use: No  . Drug Use: No     Comment: Patient denies marijuana and cociane use 01/19/15  . Sexual Activity: Yes   Other Topics Concern  . Not on file   Social History Narrative    No Known Allergies  Current Facility-Administered Medications  Medication Dose Route Frequency Provider Last Rate Last Dose  . 0.9 %  sodium chloride infusion   Intravenous Continuous Kerin Perna, MD 75 mL/hr at 10/26/15 1100    . acetaminophen (TYLENOL) tablet 650 mg  650 mg Oral Q6H PRN Kerin Perna, MD      . albuterol (PROVENTIL) (2.5 MG/3ML) 0.083% nebulizer solution 2.5 mg  2.5 mg Nebulization Q2H PRN Kerin Perna, MD      . aspirin EC tablet 81 mg  81 mg Oral Daily Ram Daniel Nones, MD   81 mg at 10/26/15 1610  . diazepam (VALIUM) tablet 5-10 mg  5-10 mg Oral Q4H PRN Kerin Perna, MD      . docusate sodium (COLACE) capsule 100 mg  100 mg Oral BID Kerin Perna, MD    100 mg at 10/26/15 0907  . fentaNYL (SUBLIMAZE) injection 50 mcg  50 mcg Intravenous Q2H PRN Kerin Perna, MD   50 mcg at 10/25/15 2103  . guaiFENesin (MUCINEX) 12 hr tablet 600 mg  600 mg Oral BID Kerin Perna, MD   600 mg at 10/26/15 9604  . HYDROcodone-acetaminophen (NORCO/VICODIN) 5-325 MG per tablet 1-2 tablet  1-2 tablet Oral Q4H PRN Kerin Perna, MD      . labetalol (NORMODYNE,TRANDATE) injection 10 mg  10 mg Intravenous Q2H PRN Kerin Perna, MD   10 mg at 10/26/15 1006  . magnesium hydroxide (MILK OF MAGNESIA) suspension 30 mL  30 mL Oral Daily PRN Kerin Perna, MD      . metoprolol tartrate (LOPRESSOR) tablet 25 mg  25 mg Oral BID Kerin Perna, MD   25 mg at 10/26/15 5409  . nicotine (NICODERM CQ - dosed in mg/24 hours) patch 14 mg  14 mg Transdermal Daily Kerin Perna, MD   14 mg at 10/26/15 1015  . sorbitol 70 % solution 30 mL  30 mL Oral Daily PRN Kerin Perna, MD      . Melene Muller ON 10/27/2015] thiamine (VITAMIN B-1) tablet 100 mg  100 mg Oral Daily Kerin Perna, MD        Prescriptions prior to admission  Medication Sig Dispense Refill Last Dose  . acetaminophen-codeine (TYLENOL #3) 300-30 MG per tablet Take 1 tablet by mouth every 8 (eight) hours as needed for moderate pain. (Patient not taking: Reported on 03/26/2015) 60 tablet 2 Completed Course at Unknown time  . ergocalciferol (DRISDOL) 50000 UNITS capsule Take 1 capsule (50,000 Units total) by mouth once a week. (Patient not taking: Reported on 03/26/2015) 9 capsule 0 Not Taking at Unknown time  . losartan (COZAAR) 100 MG tablet Take 1 tablet (100 mg total) by mouth daily. (Patient not taking: Reported on 10/25/2015) 90 tablet 3 Not Taking at Unknown time  . metoprolol tartrate (LOPRESSOR) 25 MG tablet Take 1 tablet (25 mg total) by mouth 2 (two) times daily. (Patient not taking: Reported on 10/25/2015) 180 tablet 3 Not Taking at Unknown time    Family History  Problem Relation Age of Onset  . Diabetes  Father   .  Heart disease Father   . Dementia Father      Review of Systems:       Cardiac Review of Systems: Y or N  Chest Pain [  no  ]  Resting SOB [ no  ] Exertional SOB  [ no ]  Ortho no pnea [  ]   Pedal Edema [   no]    Palpitations [no  ] Syncope  [no no  ]   Presyncope [   ]  General Review of Systems: [Y] = yes [  ]=no Constitional: recent weight change [  ]; anorexia [  ]; fatigue [  ]; nausea [  ]; night sweats [  ]; fever [  ]; or chills [  ]                                                               Dental: poor dentition[  ]; Last Dentist visit: Greater than one year  Eye : blurred vision [ yes ]; diplopia [   ]; vision changes [ yes ];  Amaurosis fugax[  ]; Resp: cough [  ];  wheezing[  ];  hemoptysis[  ]; shortness of breath[  ]; paroxysmal nocturnal dyspnea[  ]; dyspnea on exertion[  ]; or orthopnea[  ];  GI:  gallstones[  ], vomiting[  ];  dysphagia[  ]; melena[  ];  hematochezia [  ]; heartburn[  ];   Hx of  Colonoscopy[  ]; GU: kidney stones [  ]; hematuria[  ];   dysuria [  ];  nocturia[  ];  history of     obstruction [  ]; urinary frequency [  ]             Skin: rash, swelling[  ];, hair loss[  ];  peripheral edema[  ];  or itching[  ]; Musculosketetal: myalgias[  ];  joint swelling[  ];  joint erythema[  ]; right shoulder discomfort  joint pain[  ];  back pain[  ];  Heme/Lymph: bruising[  ];  bleeding[  ];  anemia[  ];  Neuro: TIA[  ];  headaches[  ];  stroke[yes-right occipital lobe CVA  ];  vertigo[  ];  seizures[  ];   paresthesias[  ];  difficulty walking[  ];  Psych:depression[  ]; anxiety[  ];  Endocrine: diabetes[  ];  thyroid dysfunction[  ];  Immunizations: Flu [  ]; Pneumococcal[  ];  Other: Right-hand dominant  Physical Exam: BP 139/72 mmHg  Pulse 63  Temp(Src) 99.5 F (37.5 C) (Oral)  Resp 17  Ht  (1.702 m)  Wt 244 lb 7.8 oz (110.9 kg)  BMI 38.28 kg/m2  SpO2 96%       Physical Exam  General: Middle-aged well-nourished AA male no  acute distress in the ED HEENT: Normocephalic pupils equal , dentition suboptimal Neck: Supple without JVD, adenopathy, or bruit. Carotid pulses palpable Chest: Clear to auscultation, symmetrical breath sounds, no rhonchi, no tenderness             or deformity Cardiovascular: Regular rate and rhythm, no murmur, no gallop, peripheral pulses palpable and left upper extremity and both lower extremities but not palpable in right upper extremity Abdomen:  Soft, nontender, no palpable mass or organomegaly Extremities:  Warm, well-perfused, no clubbing cyanosis edema or tenderness,              no venous stasis changes of the legs Rectal/GU: Deferred Neuro: Grossly non--focal and symmetrical throughout Skin: Clean and dry without rash or ulceration   Diagnostic Studies & Laboratory data:     Recent Radiology Findings:   Ct Angio Head W/cm &/or Wo Cm  10/25/2015  CLINICAL DATA:  Subclavian artery stenosis. Embolic stroke. Dizziness and left arm weakness. EXAM: CT ANGIOGRAPHY HEAD AND NECK TECHNIQUE: Multidetector CT imaging of the head and neck was performed using the standard protocol during bolus administration of intravenous contrast. Multiplanar CT image reconstructions and MIPs were obtained to evaluate the vascular anatomy. Carotid stenosis measurements (when applicable) are obtained utilizing NASCET criteria, using the distal internal carotid diameter as the denominator. CONTRAST:  50mL OMNIPAQUE IOHEXOL 350 MG/ML SOLN COMPARISON:  Head CT earlier same day. FINDINGS: CTA NECK Aortic arch: See results of chest CT for full description of extensive aortic dissection. Right carotid system: The dissection extends into the right common carotid artery and affects the majority that vessel all way to the arch. Minimal diameter is 2.5 mm. This indicates a 66% stenosis. The bifurcation is widely patent. The cervical internal carotid artery is widely patent. Left carotid system: Left common carotid artery is  widely patent to the bifurcation. The carotid bifurcation shows mild atherosclerotic change but there is no stenosis or irregularity. Cervical internal carotid artery is widely patent. Vertebral arteries:The dissection involves the right subclavian artery. The right vertebral artery probably arises from the false limb an. There is diminished if any flow within the right vertebral artery proximally. There is reconstitution via cervical collaterals more distally with the vessel being patent at the foramen magnum level. The dissection extends into the left subclavian artery proximally, but does not involve the origin of the left vertebral artery which is widely patent. This vessel is widely patent through the cervical region to the foramen magnum. Skeleton: Mild spondylosis Other neck: No significant soft tissue lesion. CTA HEAD Anterior circulation: The internal carotid arteries are widely patent through the siphon region. There is atherosclerosis in that region but no stenosis greater than 30%. Supra clinoid internal carotid arteries are widely patent. The anterior and middle cerebral vessels are patent without proximal stenosis, aneurysm or vascular malformation. No large vessel anterior circulation infarction is identified. Posterior circulation: Both vertebral arteries show flow at the foramen magnum and both contribute to the basilar. There is no basilar stenosis. Basilar tip is bulbous, but there is no berry aneurysm. Superior cerebellar and posterior cerebral arteries originate from this bulbous region. There is flow in both posterior cerebral artery territories, including on the right were there is infarction by CT. Venous sinuses: Patent and normal Anatomic variants: None significant Delayed phase: No abnormal enhancement IMPRESSION: Extensive dissection of the thoracic aorta fully described at chest CTA. The dissection extends into the right common carotid artery all the way to the bifurcation. Stenosis is  66%. The bifurcation is widely patent and the cervical internal carotid artery on the right is widely patent. Left common carotid artery, carotid bifurcation and cervical ICA are widely patent. No flow demonstrated in the proximal right vertebral artery, probably because of the vessel originating from the false lumen. There is distal reconstitution by cervical collaterals. Left vertebral artery is widely patent. No intracranial large or medium vessel occlusion demonstrated. This includes flow present within the right PCA territory at this time. Electronically Signed  By: Paulina Fusi M.D.   On: 10/25/2015 14:40   Ct Head Wo Contrast  10/25/2015  CLINICAL DATA:  Peripheral vision loss. Dizziness and headache. Peripheral vision loss in left eye, dizziness, and headache. Pt states he has decreased sensation on right side and feels off balance. Hx HTN. EXAM: CT HEAD WITHOUT CONTRAST TECHNIQUE: Contiguous axial images were obtained from the base of the skull through the vertex without intravenous contrast. COMPARISON:  None. FINDINGS: There is cortical hypodensity in the medial RIGHT occipital lobe measuring 4 cm by 2 cm. There is mild mass effect to the posterior interhemispheric fissure which is slightly bowed leftward. Linear rounded hypodense lesion in the RIGHT cerebellum on image 11, series 2 which is much smaller measuring 1.1 by 0.6 cm. Adjacent 0.7 mm lesion on image 10. No intracranial hemorrhage. Ventricles are normal volume. There is a subtle focal enlargement of the RIGHT ventricular atria on image 17, series 2. IMPRESSION: 1. Hypodense cortical lesion with mild edema / mass effect in the medial RIGHT occipital lobe is most suggestive of a cortical infarction. Cannot completely exclude a a parenchymal mass lesion. Recommend brain MRI for further evaluation. 2. Small lesions in the RIGHT cerebellum most consistent with age indeterminate infarction. 3. No intracranial hemorrhage.  No hydrocephalus.  Findings conveyed toJosh Zavits on 10/25/2015  at12:31. Electronically Signed   By: Genevive Bi M.D.   On: 10/25/2015 12:32   Ct Angio Neck W/cm &/or Wo/cm  10/25/2015  CLINICAL DATA:  Subclavian artery stenosis. Embolic stroke. Dizziness and left arm weakness. EXAM: CT ANGIOGRAPHY HEAD AND NECK TECHNIQUE: Multidetector CT imaging of the head and neck was performed using the standard protocol during bolus administration of intravenous contrast. Multiplanar CT image reconstructions and MIPs were obtained to evaluate the vascular anatomy. Carotid stenosis measurements (when applicable) are obtained utilizing NASCET criteria, using the distal internal carotid diameter as the denominator. CONTRAST:  50mL OMNIPAQUE IOHEXOL 350 MG/ML SOLN COMPARISON:  Head CT earlier same day. FINDINGS: CTA NECK Aortic arch: See results of chest CT for full description of extensive aortic dissection. Right carotid system: The dissection extends into the right common carotid artery and affects the majority that vessel all way to the arch. Minimal diameter is 2.5 mm. This indicates a 66% stenosis. The bifurcation is widely patent. The cervical internal carotid artery is widely patent. Left carotid system: Left common carotid artery is widely patent to the bifurcation. The carotid bifurcation shows mild atherosclerotic change but there is no stenosis or irregularity. Cervical internal carotid artery is widely patent. Vertebral arteries:The dissection involves the right subclavian artery. The right vertebral artery probably arises from the false limb an. There is diminished if any flow within the right vertebral artery proximally. There is reconstitution via cervical collaterals more distally with the vessel being patent at the foramen magnum level. The dissection extends into the left subclavian artery proximally, but does not involve the origin of the left vertebral artery which is widely patent. This vessel is widely patent through  the cervical region to the foramen magnum. Skeleton: Mild spondylosis Other neck: No significant soft tissue lesion. CTA HEAD Anterior circulation: The internal carotid arteries are widely patent through the siphon region. There is atherosclerosis in that region but no stenosis greater than 30%. Supra clinoid internal carotid arteries are widely patent. The anterior and middle cerebral vessels are patent without proximal stenosis, aneurysm or vascular malformation. No large vessel anterior circulation infarction is identified. Posterior circulation: Both vertebral arteries show flow  at the foramen magnum and both contribute to the basilar. There is no basilar stenosis. Basilar tip is bulbous, but there is no berry aneurysm. Superior cerebellar and posterior cerebral arteries originate from this bulbous region. There is flow in both posterior cerebral artery territories, including on the right were there is infarction by CT. Venous sinuses: Patent and normal Anatomic variants: None significant Delayed phase: No abnormal enhancement IMPRESSION: Extensive dissection of the thoracic aorta fully described at chest CTA. The dissection extends into the right common carotid artery all the way to the bifurcation. Stenosis is 66%. The bifurcation is widely patent and the cervical internal carotid artery on the right is widely patent. Left common carotid artery, carotid bifurcation and cervical ICA are widely patent. No flow demonstrated in the proximal right vertebral artery, probably because of the vessel originating from the false lumen. There is distal reconstitution by cervical collaterals. Left vertebral artery is widely patent. No intracranial large or medium vessel occlusion demonstrated. This includes flow present within the right PCA territory at this time. Electronically Signed   By: Paulina Fusi M.D.   On: 10/25/2015 14:40   Ct Angio Abdomen W/cm &/or Wo Contrast  10/25/2015  CLINICAL DATA:  Known there  dissection. History is noted the clot. Screw acute emboli. Dizziness, weakness. EXAM: CT ANGIOGRAPHY CHEST, ABDOMEN TECHNIQUE: Multidetector CT imaging through the chest, abdomen was performed using the standard protocol during bolus administration of intravenous contrast. Multiplanar reconstructed images and MIPs were obtained and reviewed to evaluate the vascular anatomy. CONTRAST:  80mL OMNIPAQUE IOHEXOL 350 MG/ML SOLN COMPARISON:  CTA neck same day, is CT thorax 423 16 FINDINGS: CTA CHEST FINDINGS Mediastinum/Nodes: Previously described Type B Aortic dissection has progressed proximally and extends through the ascending thoracic aorta to root of the aorta (type A dissection). The dissection flap extending from the ascending aorta extends into the great vessels involves the brachycephalic artery. The dissection flap extends through the brachiocephalic artery into the RIGHT subclavian artery and superiorly into the carotid artery. A short dissection flap extends into the LEFT subclavian artery which is ectatic. The great vessel involvement is better depicted on the CTA of the neck. RIGHT subclavian artery is poorly perfused. The dissection flap extends into the abdominal aorta. Lungs/Pleura: Bibasilar atelectasis. No pulmonary infarction. No pneumothorax. Review of the MIP images confirms the above findings. CTA ABDOMEN AND PELVIS FINDINGS Dissection flap extends into the abdominal aorta. The true lumen supplies the celiac trunk and the SMA as well as the RIGHT renal artery. The false lumen supplies the LEFT renal artery. The flap extends into the LEFT common iliac artery. Both kidneys perfuse evenly an uniformly. Lower chest: Lung bases are clear. Hepatobiliary: No focal hepatic lesion. No biliary duct dilatation. Gallbladder is normal. Common bile duct is normal. Pancreas: Pancreas is normal. No ductal dilatation. No pancreatic inflammation. Spleen: Normal spleen Adrenals/urinary tract: Adrenal glands and  kidneys are normal. Proximal ureters normal. Stomach/Bowel: Stomach, small bowel, appendix, and cecum are normal. Limited view of the colon are unremarkable. Vascular/Lymphatic: Abdominal aorta is normal caliber. There is no retroperitoneal or periportal lymphadenopathy. No pelvic lymphadenopathy. Other: No free fluid. Musculoskeletal: No aggressive osseous lesion. Review of the MIP images confirms the above findings. IMPRESSION: Chest Impression: 1. Type A aortic dissection has progressed from type B dissection on 01/02/2015. 2. Dissection extends into the great vessels involving the brachiocephalic artery, RIGHT carotid artery, RIGHT subclavian artery. RIGHT subclavian artery is poorly perfused. 3. Short dissection flap extends into the LEFT  subclavian artery. Abdomen / Pelvis Impression: 1. Type A dissection extends through the abdominal aorta to the LEFT common iliac artery. 2. The kidneys enhance symmetrically. SMA and celiac artery perfuse from the through the true lumen. Critical Value/emergent results were called by telephone at the time of interpretation on 10/25/2015 at 2 :50 pm to Dr. Blane Ohara , who verbally acknowledged these results. Electronically Signed   By: Genevive Bi M.D.   On: 10/25/2015 15:16   Dg Chest Port 1 View  10/26/2015  CLINICAL DATA:  Aortic dissection and shortness of breath EXAM: PORTABLE CHEST 1 VIEW COMPARISON:  01/03/2015 FINDINGS: Cardiac shadow is within normal limits. There is widening of the superior mediastinum consistent with the known history of aortic dissection. Lungs are clear bilaterally. No acute bony abnormality is seen. IMPRESSION: Prominence of the aortic consistent with the given clinical history. No other acute abnormality is noted within the chest Electronically Signed   By: Alcide Clever M.D.   On: 10/26/2015 08:05   Ct Angio Chest Aorta W/cm &/or Wo/cm  10/25/2015  CLINICAL DATA:  Known there dissection. History is noted the clot. Screw acute  emboli. Dizziness, weakness. EXAM: CT ANGIOGRAPHY CHEST, ABDOMEN TECHNIQUE: Multidetector CT imaging through the chest, abdomen was performed using the standard protocol during bolus administration of intravenous contrast. Multiplanar reconstructed images and MIPs were obtained and reviewed to evaluate the vascular anatomy. CONTRAST:  80mL OMNIPAQUE IOHEXOL 350 MG/ML SOLN COMPARISON:  CTA neck same day, is CT thorax 423 16 FINDINGS: CTA CHEST FINDINGS Mediastinum/Nodes: Previously described Type B Aortic dissection has progressed proximally and extends through the ascending thoracic aorta to root of the aorta (type A dissection). The dissection flap extending from the ascending aorta extends into the great vessels involves the brachycephalic artery. The dissection flap extends through the brachiocephalic artery into the RIGHT subclavian artery and superiorly into the carotid artery. A short dissection flap extends into the LEFT subclavian artery which is ectatic. The great vessel involvement is better depicted on the CTA of the neck. RIGHT subclavian artery is poorly perfused. The dissection flap extends into the abdominal aorta. Lungs/Pleura: Bibasilar atelectasis. No pulmonary infarction. No pneumothorax. Review of the MIP images confirms the above findings. CTA ABDOMEN AND PELVIS FINDINGS Dissection flap extends into the abdominal aorta. The true lumen supplies the celiac trunk and the SMA as well as the RIGHT renal artery. The false lumen supplies the LEFT renal artery. The flap extends into the LEFT common iliac artery. Both kidneys perfuse evenly an uniformly. Lower chest: Lung bases are clear. Hepatobiliary: No focal hepatic lesion. No biliary duct dilatation. Gallbladder is normal. Common bile duct is normal. Pancreas: Pancreas is normal. No ductal dilatation. No pancreatic inflammation. Spleen: Normal spleen Adrenals/urinary tract: Adrenal glands and kidneys are normal. Proximal ureters normal.  Stomach/Bowel: Stomach, small bowel, appendix, and cecum are normal. Limited view of the colon are unremarkable. Vascular/Lymphatic: Abdominal aorta is normal caliber. There is no retroperitoneal or periportal lymphadenopathy. No pelvic lymphadenopathy. Other: No free fluid. Musculoskeletal: No aggressive osseous lesion. Review of the MIP images confirms the above findings. IMPRESSION: Chest Impression: 1. Type A aortic dissection has progressed from type B dissection on 01/02/2015. 2. Dissection extends into the great vessels involving the brachiocephalic artery, RIGHT carotid artery, RIGHT subclavian artery. RIGHT subclavian artery is poorly perfused. 3. Short dissection flap extends into the LEFT subclavian artery. Abdomen / Pelvis Impression: 1. Type A dissection extends through the abdominal aorta to the LEFT common iliac artery. 2.  The kidneys enhance symmetrically. SMA and celiac artery perfuse from the through the true lumen. Critical Value/emergent results were called by telephone at the time of interpretation on 10/25/2015 at 2 :50 pm to Dr. Blane Ohara , who verbally acknowledged these results. Electronically Signed   By: Genevive Bi M.D.   On: 10/25/2015 15:16     I have independently reviewed the above radiologic studies.  Recent Lab Findings: Lab Results  Component Value Date   WBC 7.8 10/26/2015   HGB 14.1 10/26/2015   HCT 45.7 10/26/2015   PLT 177 10/26/2015   PLT 184 10/26/2015   GLUCOSE 98 10/26/2015   ALT 14* 10/26/2015   AST 16 10/26/2015   NA 138 10/26/2015   K 3.9 10/26/2015   CL 102 10/26/2015   CREATININE 1.09 10/26/2015   BUN 12 10/26/2015   CO2 24 10/26/2015   TSH 3.707 01/12/2015   INR 1.19 10/26/2015   INR 1.23 10/26/2015   HGBA1C 6.1* 01/02/2015      Assessment / Plan:     Hypertension   Cocaine abuse-drug screen on admission positive for cocaine    Previous type B dissection of the descending thoracic aorta April 2016       More recent, probable  subacute type A aortic dissection involving the ascending aorta above the aortic root extending into the arch vessels     Acute right occipital lobe CVA with visual field deficit on left  Recommend surgical replacement of his ascending aorta and reconstruction of arch vessels. Patient is still not ready to agree to surgery but is considering. I have  discussed the risks and benefits of both the recommended surgery as well as the risks and benefits of not having surgery and the patient demonstrates his understanding.     @ME1 @ 10/26/2015 11:40 AM

## 2015-10-27 ENCOUNTER — Inpatient Hospital Stay (HOSPITAL_COMMUNITY): Payer: Medicaid Other

## 2015-10-27 DIAGNOSIS — Z720 Tobacco use: Secondary | ICD-10-CM

## 2015-10-27 DIAGNOSIS — I7100 Dissection of unspecified site of aorta: Secondary | ICD-10-CM

## 2015-10-27 DIAGNOSIS — I6330 Cerebral infarction due to thrombosis of unspecified cerebral artery: Secondary | ICD-10-CM

## 2015-10-27 LAB — COMPREHENSIVE METABOLIC PANEL
ALT: 14 U/L — ABNORMAL LOW (ref 17–63)
AST: 17 U/L (ref 15–41)
Albumin: 2.9 g/dL — ABNORMAL LOW (ref 3.5–5.0)
Alkaline Phosphatase: 60 U/L (ref 38–126)
Anion gap: 9 (ref 5–15)
BUN: 5 mg/dL — ABNORMAL LOW (ref 6–20)
CO2: 23 mmol/L (ref 22–32)
Calcium: 8.1 mg/dL — ABNORMAL LOW (ref 8.9–10.3)
Chloride: 105 mmol/L (ref 101–111)
Creatinine, Ser: 0.94 mg/dL (ref 0.61–1.24)
GFR calc Af Amer: >60 mL/min (ref >60)
GFR calc non Af Amer: >60 mL/min (ref >60)
Glucose, Bld: 112 mg/dL — ABNORMAL HIGH (ref 65–99)
Potassium: 3.7 mmol/L (ref 3.5–5.1)
Sodium: 137 mmol/L (ref 135–145)
Total Bilirubin: 1.1 mg/dL (ref 0.3–1.2)
Total Protein: 6 g/dL — ABNORMAL LOW (ref 6.5–8.1)

## 2015-10-27 LAB — TYPE AND SCREEN
ABO/RH(D): O POS
Antibody Screen: NEGATIVE
Unit division: 0
Unit division: 0
Unit division: 0
Unit division: 0

## 2015-10-27 LAB — POCT I-STAT 3, ART BLOOD GAS (G3+)
Acid-base deficit: 4 mmol/L — ABNORMAL HIGH (ref 0.0–2.0)
Bicarbonate: 18 mEq/L — ABNORMAL LOW (ref 20.0–24.0)
O2 Saturation: 95 %
Patient temperature: 98.6
TCO2: 19 mmol/L (ref 0–100)
pCO2 arterial: 25.8 mmHg — ABNORMAL LOW (ref 35.0–45.0)
pH, Arterial: 7.451 — ABNORMAL HIGH (ref 7.350–7.450)
pO2, Arterial: 72 mmHg — ABNORMAL LOW (ref 80.0–100.0)

## 2015-10-27 LAB — CBC
HCT: 44.8 % (ref 39.0–52.0)
Hemoglobin: 14.1 g/dL (ref 13.0–17.0)
MCH: 25.8 pg — ABNORMAL LOW (ref 26.0–34.0)
MCHC: 31.5 g/dL (ref 30.0–36.0)
MCV: 82.1 fL (ref 78.0–100.0)
Platelets: 162 10*3/uL (ref 150–400)
RBC: 5.46 MIL/uL (ref 4.22–5.81)
RDW: 14.1 % (ref 11.5–15.5)
WBC: 9 10*3/uL (ref 4.0–10.5)

## 2015-10-27 LAB — PREPARE RBC (CROSSMATCH)

## 2015-10-27 LAB — LIPID PANEL
Cholesterol: 120 mg/dL (ref 0–200)
HDL: 35 mg/dL — ABNORMAL LOW (ref >40)
LDL Cholesterol: 73 mg/dL (ref 0–99)
Total CHOL/HDL Ratio: 3.4 RATIO
Triglycerides: 61 mg/dL (ref <150)
VLDL: 12 mg/dL (ref 0–40)

## 2015-10-27 MED ORDER — MAGNESIUM SULFATE 50 % IJ SOLN
40.0000 meq | INTRAMUSCULAR | Status: DC
  Filled 2015-10-27: qty 10

## 2015-10-27 MED ORDER — PLASMA-LYTE 148 IV SOLN
INTRAVENOUS | Status: AC
  Administered 2015-10-28: 500 mL
  Filled 2015-10-27: qty 2.5

## 2015-10-27 MED ORDER — DIAZEPAM 5 MG PO TABS
5.0000 mg | ORAL_TABLET | Freq: Once | ORAL | Status: AC
  Administered 2015-10-28: 5 mg via ORAL
  Filled 2015-10-27: qty 1

## 2015-10-27 MED ORDER — CHLORHEXIDINE GLUCONATE 4 % EX LIQD
60.0000 mL | Freq: Once | CUTANEOUS | Status: AC
  Administered 2015-10-28: 4 via TOPICAL
  Filled 2015-10-27 (×2): qty 60

## 2015-10-27 MED ORDER — DEXTROSE 5 % IV SOLN
1.5000 g | INTRAVENOUS | Status: DC
  Filled 2015-10-27: qty 1.5

## 2015-10-27 MED ORDER — DOPAMINE-DEXTROSE 3.2-5 MG/ML-% IV SOLN
0.0000 ug/kg/min | INTRAVENOUS | Status: AC
  Administered 2015-10-28: 3 ug/kg/min via INTRAVENOUS
  Filled 2015-10-27: qty 250

## 2015-10-27 MED ORDER — PHENYLEPHRINE HCL 10 MG/ML IJ SOLN
30.0000 ug/min | INTRAVENOUS | Status: DC
  Filled 2015-10-27: qty 2

## 2015-10-27 MED ORDER — SODIUM CHLORIDE 0.9 % IV SOLN
INTRAVENOUS | Status: DC
  Filled 2015-10-27: qty 30

## 2015-10-27 MED ORDER — DEXMEDETOMIDINE HCL IN NACL 400 MCG/100ML IV SOLN
0.1000 ug/kg/h | INTRAVENOUS | Status: DC
  Filled 2015-10-27: qty 100

## 2015-10-27 MED ORDER — ALPRAZOLAM 0.25 MG PO TABS: 0.2500 mg | ORAL_TABLET | ORAL | Status: DC | PRN

## 2015-10-27 MED ORDER — POTASSIUM CHLORIDE CRYS ER 20 MEQ PO TBCR
20.0000 meq | EXTENDED_RELEASE_TABLET | Freq: Two times a day (BID) | ORAL | Status: AC
  Administered 2015-10-27 (×2): 20 meq via ORAL
  Filled 2015-10-27: qty 1

## 2015-10-27 MED ORDER — SODIUM CHLORIDE 0.9 % IV SOLN
INTRAVENOUS | Status: DC
  Filled 2015-10-27: qty 2.5

## 2015-10-27 MED ORDER — BISACODYL 5 MG PO TBEC
5.0000 mg | DELAYED_RELEASE_TABLET | Freq: Once | ORAL | Status: AC
  Administered 2015-10-27: 5 mg via ORAL
  Filled 2015-10-27: qty 1

## 2015-10-27 MED ORDER — CHLORHEXIDINE GLUCONATE 0.12 % MT SOLN
15.0000 mL | Freq: Once | OROMUCOSAL | Status: AC
  Administered 2015-10-28: 15 mL via OROMUCOSAL
  Filled 2015-10-27: qty 15

## 2015-10-27 MED ORDER — VANCOMYCIN HCL 10 G IV SOLR
1250.0000 mg | INTRAVENOUS | Status: DC
  Filled 2015-10-27: qty 1250

## 2015-10-27 MED ORDER — CEFUROXIME SODIUM 750 MG IJ SOLR
750.0000 mg | INTRAMUSCULAR | Status: DC
  Filled 2015-10-27: qty 750

## 2015-10-27 MED ORDER — DEXTROSE 5 % IV SOLN
0.0000 ug/min | INTRAVENOUS | Status: AC
  Administered 2015-10-28: 3 ug/min via INTRAVENOUS
  Filled 2015-10-27: qty 4

## 2015-10-27 MED ORDER — POTASSIUM CHLORIDE 2 MEQ/ML IV SOLN
80.0000 meq | INTRAVENOUS | Status: DC
  Filled 2015-10-27: qty 40

## 2015-10-27 MED ORDER — POTASSIUM CHLORIDE CRYS ER 20 MEQ PO TBCR
20.0000 meq | EXTENDED_RELEASE_TABLET | Freq: Once | ORAL | Status: DC
  Filled 2015-10-27: qty 1

## 2015-10-27 MED ORDER — CHLORHEXIDINE GLUCONATE 4 % EX LIQD
60.0000 mL | Freq: Once | CUTANEOUS | Status: AC
  Administered 2015-10-27: 4 via TOPICAL
  Filled 2015-10-27 (×3): qty 60

## 2015-10-27 MED ORDER — AMINOCAPROIC ACID 250 MG/ML IV SOLN
INTRAVENOUS | Status: DC
  Filled 2015-10-27: qty 40

## 2015-10-27 MED ORDER — TEMAZEPAM 15 MG PO CAPS
15.0000 mg | ORAL_CAPSULE | Freq: Once | ORAL | Status: AC | PRN
  Administered 2015-10-27: 15 mg via ORAL
  Filled 2015-10-27: qty 1

## 2015-10-27 MED ORDER — ~~LOC~~ CARDIAC SURGERY, PATIENT & FAMILY EDUCATION
Freq: Once | Status: AC
  Administered 2015-10-27: 1
  Filled 2015-10-27: qty 1

## 2015-10-27 MED ORDER — METOPROLOL TARTRATE 12.5 MG HALF TABLET
12.5000 mg | ORAL_TABLET | Freq: Once | ORAL | Status: AC
  Administered 2015-10-28: 12.5 mg via ORAL
  Filled 2015-10-27: qty 1

## 2015-10-27 MED ORDER — POTASSIUM CHLORIDE CRYS ER 20 MEQ PO TBCR
20.0000 meq | EXTENDED_RELEASE_TABLET | ORAL | Status: DC | PRN
  Administered 2015-10-27: 20 meq via ORAL
  Filled 2015-10-27: qty 1

## 2015-10-27 MED ORDER — NITROGLYCERIN IN D5W 200-5 MCG/ML-% IV SOLN
2.0000 ug/min | INTRAVENOUS | Status: DC
  Filled 2015-10-27: qty 250

## 2015-10-27 NOTE — Progress Notes (Signed)
Patient ID: [name], male   DOB: [birth year], 55 y.o.   MRN: [id number] EVENING ROUNDS NOTE :     [address]       [place] [address]             [phone]                 2 Days Post-Op Procedure(s) (LRB): AORTIC DISSECTION REPAIR WITH CIRC ARREST (N/A) TRANSESOPHAGEAL ECHOCARDIOGRAM (TEE) (N/A)  Total Length of Stay:  LOS: 2 days  BP 139/83 mmHg  Pulse 82  Temp(Src) 99.7 F (37.6 C) (Oral)  Resp 24  Ht  (1.702 m)  Wt 248 lb 14.4 oz (112.9 kg)  BMI 38.97 kg/m2  SpO2 100%  .Intake/Output      02/14 0701 - 02/15 0700 02/15 0701 - 02/16 0700   P.O. 1260    I.V. (mL/kg) 1875 (16.6) 750 (6.6)   Total Intake(mL/kg) 3135 (27.8) 750 (6.6)   Urine (mL/kg/hr) 2345 (0.9) 800 (0.6)   Total Output 2345 800   Net +790 -50          . sodium chloride 75 mL/hr at 10/27/15 0800     Lab Results  Component Value Date   WBC 9.0 10/27/2015   HGB 14.1 10/27/2015   HCT 44.8 10/27/2015   PLT 162 10/27/2015   GLUCOSE 112* 10/27/2015   CHOL 120 10/27/2015   TRIG 61 10/27/2015   HDL 35* 10/27/2015   LDLCALC 73 10/27/2015   ALT 14* 10/27/2015   AST 17 10/27/2015   NA 137 10/27/2015   K 3.7 10/27/2015   CL 105 10/27/2015   CREATININE 0.94 10/27/2015   BUN 5* 10/27/2015   CO2 23 10/27/2015   TSH 3.707 01/12/2015   INR 1.19 10/26/2015   INR 1.23 10/26/2015   HGBA1C 6.1* 01/02/2015     Delight Ovens MD  Beeper [phone] Office [phone] 10/27/2015 6:10 PM

## 2015-10-27 NOTE — Progress Notes (Signed)
STROKE TEAM PROGRESS NOTE   SUBJECTIVE (INTERVAL HISTORY) No family is at the bedside.  Overall he feels his condition is unchanged. He still has left hemianopia. He has no new complaints. He has decided to proceed with repair of his aortic dissection tomorrow. Discussed with him regarding cocaine abuse and cessation. He is willing to quit.   OBJECTIVE Temp:  [98.5 F (36.9 C)-99.8 F (37.7 C)] 99.7 F (37.6 C) (02/15 1606) Pulse Rate:  [67-86] 82 (02/15 1730) Cardiac Rhythm:  [-] Normal sinus rhythm (02/15 1600) Resp:  [2-27] 24 (02/15 1745) BP: (111-176)/(62-95) 139/83 mmHg (02/15 1745) SpO2:  [88 %-100 %] 100 % (02/15 1730) Weight:  [248 lb 14.4 oz (112.9 kg)] 248 lb 14.4 oz (112.9 kg) (02/15 0500)  CBC:   Recent Labs Lab 10/26/15 0540 10/27/15 0333  WBC 7.8 9.0  HGB 14.1 14.1  HCT 45.7 44.8  MCV 82.5 82.1  PLT 184  177 162    Basic Metabolic Panel:   Recent Labs Lab 10/26/15 0540 10/27/15 0333  NA 138 137  K 3.9 3.7  CL 102 105  CO2 24 23  GLUCOSE 98 112*  BUN 12 5*  CREATININE 1.09 0.94  CALCIUM 8.5* 8.1*    Lipid Panel:     Component Value Date/Time   CHOL 120 10/27/2015 0333   TRIG 61 10/27/2015 0333   HDL 35* 10/27/2015 0333   CHOLHDL 3.4 10/27/2015 0333   VLDL 12 10/27/2015 0333   LDLCALC 73 10/27/2015 0333   HgbA1c:  Lab Results  Component Value Date   HGBA1C 6.1* 01/02/2015   Urine Drug Screen:     Component Value Date/Time   LABOPIA NONE DETECTED 10/25/2015 1523   COCAINSCRNUR POSITIVE* 10/25/2015 1523   LABBENZ NONE DETECTED 10/25/2015 1523   AMPHETMU NONE DETECTED 10/25/2015 1523   THCU NONE DETECTED 10/25/2015 1523   LABBARB NONE DETECTED 10/25/2015 1523      IMAGING I have personally reviewed the radiological images below and agree with the radiology interpretations.  Ct Head Wo Contrast 10/25/2015  1. Hypodense cortical lesion with mild edema / mass effect in the medial RIGHT occipital lobe is most suggestive of a cortical  infarction. Cannot completely exclude a a parenchymal mass lesion. Recommend brain MRI for further evaluation. 2. Small lesions in the RIGHT cerebellum most consistent with age indeterminate infarction. 3. No intracranial hemorrhage.  No hydrocephalus.   Ct Angio Neck W/cm &/or Wo/cm 10/25/2015  Extensive dissection of the thoracic aorta fully described at chest CTA. The dissection extends into the right common carotid artery all the way to the bifurcation. Stenosis is 66%. The bifurcation is widely patent and the cervical internal carotid artery on the right is widely patent. Left common carotid artery, carotid bifurcation and cervical ICA are widely patent. No flow demonstrated in the proximal right vertebral artery, probably because of the vessel originating from the false lumen. There is distal reconstitution by cervical collaterals. Left vertebral artery is widely patent. No intracranial large or medium vessel occlusion demonstrated. This includes flow present within the right PCA territory at this time.   Ct Angio Abdomen W/cm &/or Wo Contrast Abdomen / Pelvis Impression: 1. Type A dissection extends through the abdominal aorta to the LEFT common iliac artery. 2. The kidneys enhance symmetrically. SMA and celiac artery perfuse from the through the true lumen.   Ct Angio Chest Aorta W/cm &/or Wo/cm 10/25/2015  Chest Impression: 1. Type A aortic dissection has progressed from type B dissection on 01/02/2015.  2. Dissection extends into the great vessels involving the brachiocephalic artery, RIGHT carotid artery, RIGHT subclavian artery. RIGHT subclavian artery is poorly perfused. 3. Short dissection flap extends into the LEFT subclavian artery.   2D echo - Left ventricle: The cavity size was normal. There was moderate concentric hypertrophy. Systolic function was normal. The estimated ejection fraction was in the range of 60% to 65%. Wall motion was normal; there were no regional wall  motion abnormalities. There was an increased relative contribution of atrial contraction to ventricular filling. Doppler parameters are consistent with abnormal left ventricular relaxation (grade 1 diastolic dysfunction). - Aortic valve: Trileaflet; mildly thickened, mildly calcified leaflets. There was trivial regurgitation. - Aorta: Aortic root dimension: 40 mm (ED). Ascending aorta diameter: 50 mm (ED). - Mitral valve: There was trivial regurgitation.  Physical exam  Temp:  [98.5 F (36.9 C)-99.8 F (37.7 C)] 99.7 F (37.6 C) (02/15 1606) Pulse Rate:  [67-86] 82 (02/15 1730) Resp:  [2-27] 24 (02/15 1745) BP: (111-176)/(62-95) 139/83 mmHg (02/15 1745) SpO2:  [88 %-100 %] 100 % (02/15 1730) Weight:  [248 lb 14.4 oz (112.9 kg)] 248 lb 14.4 oz (112.9 kg) (02/15 0500)  General - Well nourished, well developed, in no apparent distress.  Ophthalmologic - Fundi not visualized due to noncooperation.  Cardiovascular - Regular rate and rhythm.  Mental Status -  Level of arousal and orientation to time, place, and person were intact. Language including expression, naming, repetition, comprehension was assessed and found intact. Fund of Knowledge was assessed and was intact.  Cranial Nerves II - XII - II - right homonymous hemianopia. III, IV, VI - Extraocular movements intact. V - Facial sensation intact bilaterally. VII - Facial movement intact bilaterally. VIII - Hearing & vestibular intact bilaterally. X - Palate elevates symmetrically. XI - Chin turning & shoulder shrug intact bilaterally. XII - Tongue protrusion intact.  Motor Strength - The patient's strength was normal in all extremities and pronator drift was absent.  Bulk was normal and fasciculations were absent.   Motor Tone - Muscle tone was assessed at the neck and appendages and was normal.  Reflexes - The patient's reflexes were 1+ in all extremities and he had no pathological reflexes.  Sensory -  Light touch, temperature/pinprick were assessed and were symmetrical.    Coordination - The patient had normal movements in the hands and feet with no ataxia or dysmetria.  Tremor was absent.  Gait and Station - deferred due to safety concerns.   ASSESSMENT/PLAN [name] is a 55 y.o. male with history of AAA and cocaine use presenting with acute L hemianopia. He did not receive IV t-PA due to delay in arrival.   Stroke:  Right PCA infarct embolic secondary to aortic dissection large vessel disease source  Resultant  L Hemianopia   CTA head and neck R CCA dissection all way to bifurcation (from aortic dissection),  Right vertebral artery occlusion  2D Echo  EF 60-65%  LDL 73  HgbA1c pending  SCDs for VTE prophylaxis Diet Heart Room service appropriate?: Yes; Fluid consistency:: Thin Diet NPO time specified  No antithrombotic prior to admission, now on aspirin 81 mg daily.   Patient counseled to be compliant with his antithrombotic medications  Ongoing aggressive stroke risk factor management  Therapy recommendations:  pending   Disposition:  pending   Aortic dissection  Progressed since 12/2014  Likely the cause of stroke and different BP in both arms  Pt is discussing with Dr. Donata Clay  for the timing of surgery  BP goal 120 - 140  On ASA 81  Hypertension  Stable BP goal 120-140 due to aortic dissection  Cocaine use  UDS positive for cocaine  Advised to stop  Patient willing to quit  Tobacco abuse  Current smoker  Smoking cessation counseling provided  Pt is willing to quit  Other Stroke Risk Factors  Obesity, Body mass index is 38.97 kg/(m^2).   Other Active Problems    Hospital day # 2  Neurology will sign off. Please call with questions. Pt will follow up with Dr. Roda Shutters at [facility] in about 2 months. Thanks for the consult.  Marvel Plan, MD PhD Stroke Neurology 10/27/2015 5:52 PM    To contact Stroke Continuity provider,  please refer to [email or web address]. After hours, contact General Neurology

## 2015-10-27 NOTE — Progress Notes (Signed)
VASCULAR LAB PRELIMINARY  PRELIMINARY  PRELIMINARY  PRELIMINARY  Pre-op Cardiac Surgery  Carotid Findings:  Right side high resistance velocities in ICA due to dissection in the CCA -no stenosis or plaque identified in the ICA. Left side 1-39% stenosis in ICA.Bilateral Vertebral artery flow is antegrade.  Upper Extremity Right Left  Brachial Pressures 98 Biphasic 123 Triphasic  Radial Waveforms Biphasic Triphasic  Ulnar Waveforms Biphasic Triphasic  Palmar Arch (Allen's Test) normal Decreased with radial compression   Findings:  Left Doppler waveform decreased >50% with radial artery compression  -Ulnar is within limits at rest with compression. Right Doppler waveform remain normal with compressions.  Felice Deem, RVT, RDMS 10/27/2015, 1:41 PM

## 2015-10-27 NOTE — Progress Notes (Signed)
UR Completed. Branda Chaudhary, RN, BSN.  [phone] 

## 2015-10-27 NOTE — Progress Notes (Signed)
2 Days Post-Op Procedure(s) (LRB): AORTIC DISSECTION REPAIR WITH CIRC ARREST (N/A) TRANSESOPHAGEAL ECHOCARDIOGRAM (TEE) (N/A) Subjective: No chest pain L visual field cut slightly improved R radial pulse weak but present today CXR clear Echocardiogram reviewed- no effusion, good LV fx, no AI  Objective: Vital signs in last 24 hours: Temp:  [98.5 F (36.9 C)-99.8 F (37.7 C)] 98.5 F (36.9 C) (02/15 0816) Pulse Rate:  [63-85] 72 (02/15 0715) Cardiac Rhythm:  [-] Normal sinus rhythm (02/15 0700) Resp:  [2-24] 18 (02/15 0715) BP: (111-176)/(56-95) 128/72 mmHg (02/15 0715) SpO2:  [88 %-100 %] 99 % (02/15 0715) Weight:  [248 lb 14.4 oz (112.9 kg)] 248 lb 14.4 oz (112.9 kg) (02/15 0500)  Hemodynamic parameters for last 24 hours:  stable  Intake/Output from previous day: 02/14 0701 - 02/15 0700 In: 3135 [P.O.:1260; I.V.:1875] Out: 2345 [Urine:2345] Intake/Output this shift:         Exam    General- alert and comfortable   Lungs- clear without rales, wheezes   Cor- regular rate and rhythm, no murmur , gallop   Abdomen- soft, non-tender   Extremities - warm, non-tender, minimal edema   Neuro- oriented, appropriate, no focal weakness   Lab Results:  Recent Labs  10/26/15 0540 10/27/15 0333  WBC 7.8 9.0  HGB 14.1 14.1  HCT 45.7 44.8  PLT 184  177 162   BMET:  Recent Labs  10/26/15 0540 10/27/15 0333  NA 138 137  K 3.9 3.7  CL 102 105  CO2 24 23  GLUCOSE 98 112*  BUN 12 5*  CREATININE 1.09 0.94  CALCIUM 8.5* 8.1*    PT/INR:  Recent Labs  10/26/15 0540  LABPROT 15.7*  15.2  INR 1.23  1.19   ABG    Component Value Date/Time   TCO2 22 04/12/2009 1147   CBG (last 3)  No results for input(s): GLUCAP in the last 72 hours.  Assessment/Plan: S/P Procedure(s) (LRB): AORTIC DISSECTION REPAIR WITH CIRC ARREST (N/A) TRANSESOPHAGEAL ECHOCARDIOGRAM (TEE) (N/A)  Patient agrees to surgery for replacement of ascending aortic type A dissection- sched for  am 2-16 Benefits , risks and postoperative hospital care reviewed with patient and he agrees to proceed    LOS: 2 days    [name] 10/27/2015

## 2015-10-27 NOTE — Progress Notes (Signed)
Pt refused to read pre surgery book and refused to watch the pre surgery video. I educated pt the best I could. Will continue to monitor and educate accordingly.

## 2015-10-27 NOTE — Progress Notes (Signed)
Patient ID: Carlus Pavlov, male   DOB: [birth year], 55 y.o.   MRN: [id number] EVENING ROUNDS NOTE :     [address]       [place] [address]             [phone]                 2 Days Post-Op Procedure(s) (LRB): AORTIC DISSECTION REPAIR WITH CIRC ARREST (N/A) TRANSESOPHAGEAL ECHOCARDIOGRAM (TEE) (N/A)  Total Length of Stay:  LOS: 2 days  BP 139/83 mmHg  Pulse 82  Temp(Src) 99.7 F (37.6 C) (Oral)  Resp 24  Ht  (1.702 m)  Wt 248 lb 14.4 oz (112.9 kg)  BMI 38.97 kg/m2  SpO2 100%  .Intake/Output      02/14 0701 - 02/15 0700 02/15 0701 - 02/16 0700   P.O. 1260    I.V. (mL/kg) 1875 (16.6) 750 (6.6)   Total Intake(mL/kg) 3135 (27.8) 750 (6.6)   Urine (mL/kg/hr) 2345 (0.9) 800 (0.6)   Total Output 2345 800   Net +790 -50          . sodium chloride 75 mL/hr at 10/27/15 0800     Lab Results  Component Value Date   WBC 9.0 10/27/2015   HGB 14.1 10/27/2015   HCT 44.8 10/27/2015   PLT 162 10/27/2015   GLUCOSE 112* 10/27/2015   CHOL 120 10/27/2015   TRIG 61 10/27/2015   HDL 35* 10/27/2015   LDLCALC 73 10/27/2015   ALT 14* 10/27/2015   AST 17 10/27/2015   NA 137 10/27/2015   K 3.7 10/27/2015   CL 105 10/27/2015   CREATININE 0.94 10/27/2015   BUN 5* 10/27/2015   CO2 23 10/27/2015   TSH 3.707 01/12/2015   INR 1.19 10/26/2015   INR 1.23 10/26/2015   HGBA1C 6.1* 01/02/2015   For surgery in am by Dr Maren Beach No further questions   Delight Ovens MD  Beeper [phone] Office [phone] 10/27/2015 6:17 PM

## 2015-10-28 ENCOUNTER — Encounter (HOSPITAL_COMMUNITY)
Admission: EM | Disposition: A | Discharge status: Rehabilitation Facility | Payer: Self-pay | Source: Home / Self Care | Attending: Cardiothoracic Surgery

## 2015-10-28 ENCOUNTER — Emergency Department (HOSPITAL_COMMUNITY): Payer: Medicaid Other | Admitting: Certified Registered Nurse Anesthetist

## 2015-10-28 ENCOUNTER — Inpatient Hospital Stay (HOSPITAL_COMMUNITY): Admit: 2015-10-28 | Discharge: 2015-10-28 | Disposition: A | Discharge status: Home / Self Care | Payer: Medicaid Other

## 2015-10-28 ENCOUNTER — Inpatient Hospital Stay (HOSPITAL_COMMUNITY)
Admit: 2015-10-28 | Discharge: 2015-10-28 | Disposition: A | Discharge status: Home / Self Care | Payer: Medicaid Other | Attending: Cardiothoracic Surgery | Admitting: Cardiothoracic Surgery

## 2015-10-28 ENCOUNTER — Inpatient Hospital Stay (HOSPITAL_COMMUNITY): Payer: Medicaid Other

## 2015-10-28 ENCOUNTER — Encounter (HOSPITAL_COMMUNITY): Payer: Self-pay | Admitting: Nurse Practitioner

## 2015-10-28 DIAGNOSIS — I71019 Dissection of thoracic aorta, unspecified: Secondary | ICD-10-CM | POA: Diagnosis present

## 2015-10-28 DIAGNOSIS — I7101 Dissection of thoracic aorta: Secondary | ICD-10-CM | POA: Diagnosis present

## 2015-10-28 HISTORY — PX: THORACIC AORTIC ANEURYSM REPAIR: SHX799

## 2015-10-28 HISTORY — DX: Dissection of thoracic aorta, unspecified: I71.019

## 2015-10-28 HISTORY — PX: TEE WITHOUT CARDIOVERSION: SHX5443

## 2015-10-28 HISTORY — DX: Dissection of thoracic aorta: I71.01

## 2015-10-28 LAB — CBC
HCT: 44.3 % (ref 39.0–52.0)
HCT: 41.7 % (ref 39.0–52.0)
Hemoglobin: 14.1 g/dL (ref 13.0–17.0)
Hemoglobin: 13.6 g/dL (ref 13.0–17.0)
MCH: 25.9 pg — ABNORMAL LOW (ref 26.0–34.0)
MCH: 26.4 pg (ref 26.0–34.0)
MCHC: 31.8 g/dL (ref 30.0–36.0)
MCHC: 32.6 g/dL (ref 30.0–36.0)
MCV: 81.4 fL (ref 78.0–100.0)
MCV: 80.8 fL (ref 78.0–100.0)
PLATELETS: 159 10*3/uL (ref 150–400)
Platelets: 206 10*3/uL (ref 150–400)
RBC: 5.44 MIL/uL (ref 4.22–5.81)
RBC: 5.16 MIL/uL (ref 4.22–5.81)
RDW: 13.9 % (ref 11.5–15.5)
RDW: 13.7 % (ref 11.5–15.5)
WBC: 10.7 10*3/uL — ABNORMAL HIGH (ref 4.0–10.5)
WBC: 18.4 10*3/uL — ABNORMAL HIGH (ref 4.0–10.5)

## 2015-10-28 LAB — POCT I-STAT, CHEM 8
BUN: 4 mg/dL — ABNORMAL LOW (ref 6–20)
BUN: 3 mg/dL — ABNORMAL LOW (ref 6–20)
BUN: 4 mg/dL — ABNORMAL LOW (ref 6–20)
BUN: 4 mg/dL — ABNORMAL LOW (ref 6–20)
BUN: 4 mg/dL — ABNORMAL LOW (ref 6–20)
BUN: 4 mg/dL — ABNORMAL LOW (ref 6–20)
BUN: 4 mg/dL — ABNORMAL LOW (ref 6–20)
BUN: 6 mg/dL (ref 6–20)
BUN: 6 mg/dL (ref 6–20)
BUN: 6 mg/dL (ref 6–20)
BUN: 6 mg/dL (ref 6–20)
BUN: 8 mg/dL (ref 6–20)
Calcium, Ion: 1.2 mmol/L (ref 1.12–1.23)
Calcium, Ion: 1.02 mmol/L — ABNORMAL LOW (ref 1.12–1.23)
Calcium, Ion: 1.21 mmol/L (ref 1.12–1.23)
Calcium, Ion: 0.98 mmol/L — ABNORMAL LOW (ref 1.12–1.23)
Calcium, Ion: 0.98 mmol/L — ABNORMAL LOW (ref 1.12–1.23)
Calcium, Ion: 0.86 mmol/L — ABNORMAL LOW (ref 1.12–1.23)
Calcium, Ion: 0.98 mmol/L — ABNORMAL LOW (ref 1.12–1.23)
Calcium, Ion: 1.05 mmol/L — ABNORMAL LOW (ref 1.12–1.23)
Calcium, Ion: 0.9 mmol/L — ABNORMAL LOW (ref 1.12–1.23)
Calcium, Ion: 0.91 mmol/L — ABNORMAL LOW (ref 1.12–1.23)
Calcium, Ion: 0.97 mmol/L — ABNORMAL LOW (ref 1.12–1.23)
Calcium, Ion: 1.06 mmol/L — ABNORMAL LOW (ref 1.12–1.23)
Chloride: 105 mmol/L (ref 101–111)
Chloride: 100 mmol/L — ABNORMAL LOW (ref 101–111)
Chloride: 104 mmol/L (ref 101–111)
Chloride: 101 mmol/L (ref 101–111)
Chloride: 97 mmol/L — ABNORMAL LOW (ref 101–111)
Chloride: 100 mmol/L — ABNORMAL LOW (ref 101–111)
Chloride: 99 mmol/L — ABNORMAL LOW (ref 101–111)
Chloride: 101 mmol/L (ref 101–111)
Chloride: 100 mmol/L — ABNORMAL LOW (ref 101–111)
Chloride: 96 mmol/L — ABNORMAL LOW (ref 101–111)
Chloride: 99 mmol/L — ABNORMAL LOW (ref 101–111)
Chloride: 104 mmol/L (ref 101–111)
Creatinine, Ser: 0.6 mg/dL — ABNORMAL LOW (ref 0.61–1.24)
Creatinine, Ser: 0.4 mg/dL — ABNORMAL LOW (ref 0.61–1.24)
Creatinine, Ser: 0.6 mg/dL — ABNORMAL LOW (ref 0.61–1.24)
Creatinine, Ser: 0.7 mg/dL (ref 0.61–1.24)
Creatinine, Ser: 0.8 mg/dL (ref 0.61–1.24)
Creatinine, Ser: 0.7 mg/dL (ref 0.61–1.24)
Creatinine, Ser: 0.7 mg/dL (ref 0.61–1.24)
Creatinine, Ser: 0.7 mg/dL (ref 0.61–1.24)
Creatinine, Ser: 0.6 mg/dL — ABNORMAL LOW (ref 0.61–1.24)
Creatinine, Ser: 0.7 mg/dL (ref 0.61–1.24)
Creatinine, Ser: 0.7 mg/dL (ref 0.61–1.24)
Creatinine, Ser: 0.7 mg/dL (ref 0.61–1.24)
Glucose, Bld: 116 mg/dL — ABNORMAL HIGH (ref 65–99)
Glucose, Bld: 115 mg/dL — ABNORMAL HIGH (ref 65–99)
Glucose, Bld: 118 mg/dL — ABNORMAL HIGH (ref 65–99)
Glucose, Bld: 132 mg/dL — ABNORMAL HIGH (ref 65–99)
Glucose, Bld: 313 mg/dL — ABNORMAL HIGH (ref 65–99)
Glucose, Bld: 223 mg/dL — ABNORMAL HIGH (ref 65–99)
Glucose, Bld: 233 mg/dL — ABNORMAL HIGH (ref 65–99)
Glucose, Bld: 230 mg/dL — ABNORMAL HIGH (ref 65–99)
Glucose, Bld: 256 mg/dL — ABNORMAL HIGH (ref 65–99)
Glucose, Bld: 347 mg/dL — ABNORMAL HIGH (ref 65–99)
Glucose, Bld: 614 mg/dL (ref 65–99)
Glucose, Bld: 143 mg/dL — ABNORMAL HIGH (ref 65–99)
HCT: 43 % (ref 39.0–52.0)
HCT: 32 % — ABNORMAL LOW (ref 39.0–52.0)
HCT: 41 % (ref 39.0–52.0)
HCT: 27 % — ABNORMAL LOW (ref 39.0–52.0)
HCT: 27 % — ABNORMAL LOW (ref 39.0–52.0)
HCT: 24 % — ABNORMAL LOW (ref 39.0–52.0)
HCT: 25 % — ABNORMAL LOW (ref 39.0–52.0)
HCT: 38 % — ABNORMAL LOW (ref 39.0–52.0)
HCT: 38 % — ABNORMAL LOW (ref 39.0–52.0)
HCT: 39 % (ref 39.0–52.0)
HCT: 41 % (ref 39.0–52.0)
HCT: 41 % (ref 39.0–52.0)
Hemoglobin: 14.6 g/dL (ref 13.0–17.0)
Hemoglobin: 10.9 g/dL — ABNORMAL LOW (ref 13.0–17.0)
Hemoglobin: 13.9 g/dL (ref 13.0–17.0)
Hemoglobin: 9.2 g/dL — ABNORMAL LOW (ref 13.0–17.0)
Hemoglobin: 9.2 g/dL — ABNORMAL LOW (ref 13.0–17.0)
Hemoglobin: 8.2 g/dL — ABNORMAL LOW (ref 13.0–17.0)
Hemoglobin: 8.5 g/dL — ABNORMAL LOW (ref 13.0–17.0)
Hemoglobin: 12.9 g/dL — ABNORMAL LOW (ref 13.0–17.0)
Hemoglobin: 12.9 g/dL — ABNORMAL LOW (ref 13.0–17.0)
Hemoglobin: 13.3 g/dL (ref 13.0–17.0)
Hemoglobin: 13.9 g/dL (ref 13.0–17.0)
Hemoglobin: 13.9 g/dL (ref 13.0–17.0)
Potassium: 4.3 mmol/L (ref 3.5–5.1)
Potassium: 4 mmol/L (ref 3.5–5.1)
Potassium: 4.3 mmol/L (ref 3.5–5.1)
Potassium: 6.1 mmol/L (ref 3.5–5.1)
Potassium: 5.3 mmol/L — ABNORMAL HIGH (ref 3.5–5.1)
Potassium: 4.4 mmol/L (ref 3.5–5.1)
Potassium: 5.5 mmol/L — ABNORMAL HIGH (ref 3.5–5.1)
Potassium: 4 mmol/L (ref 3.5–5.1)
Potassium: 3.6 mmol/L (ref 3.5–5.1)
Potassium: 3.7 mmol/L (ref 3.5–5.1)
Potassium: 3.7 mmol/L (ref 3.5–5.1)
Potassium: 4.3 mmol/L (ref 3.5–5.1)
Sodium: 135 mmol/L (ref 135–145)
Sodium: 135 mmol/L (ref 135–145)
Sodium: 136 mmol/L (ref 135–145)
Sodium: 132 mmol/L — ABNORMAL LOW (ref 135–145)
Sodium: 133 mmol/L — ABNORMAL LOW (ref 135–145)
Sodium: 133 mmol/L — ABNORMAL LOW (ref 135–145)
Sodium: 136 mmol/L (ref 135–145)
Sodium: 138 mmol/L (ref 135–145)
Sodium: 138 mmol/L (ref 135–145)
Sodium: 140 mmol/L (ref 135–145)
Sodium: 141 mmol/L (ref 135–145)
Sodium: 141 mmol/L (ref 135–145)
TCO2: 20 mmol/L (ref 0–100)
TCO2: 23 mmol/L (ref 0–100)
TCO2: 24 mmol/L (ref 0–100)
TCO2: 26 mmol/L (ref 0–100)
TCO2: 29 mmol/L (ref 0–100)
TCO2: 21 mmol/L (ref 0–100)
TCO2: 24 mmol/L (ref 0–100)
TCO2: 24 mmol/L (ref 0–100)
TCO2: 24 mmol/L (ref 0–100)
TCO2: 24 mmol/L (ref 0–100)
TCO2: 25 mmol/L (ref 0–100)
TCO2: 23 mmol/L (ref 0–100)

## 2015-10-28 LAB — GLUCOSE, CAPILLARY
Glucose-Capillary: 155 mg/dL — ABNORMAL HIGH (ref 65–99)
Glucose-Capillary: 179 mg/dL — ABNORMAL HIGH (ref 65–99)
Glucose-Capillary: 180 mg/dL — ABNORMAL HIGH (ref 65–99)
Glucose-Capillary: 142 mg/dL — ABNORMAL HIGH (ref 65–99)
Glucose-Capillary: 122 mg/dL — ABNORMAL HIGH (ref 65–99)
Glucose-Capillary: 116 mg/dL — ABNORMAL HIGH (ref 65–99)
Glucose-Capillary: 96 mg/dL (ref 65–99)
Glucose-Capillary: 131 mg/dL — ABNORMAL HIGH (ref 65–99)
Glucose-Capillary: 141 mg/dL — ABNORMAL HIGH (ref 65–99)

## 2015-10-28 LAB — POCT I-STAT 3, ART BLOOD GAS (G3+)
Acid-base deficit: 4 mmol/L — ABNORMAL HIGH (ref 0.0–2.0)
Acid-base deficit: 8 mmol/L — ABNORMAL HIGH (ref 0.0–2.0)
Acid-base deficit: 4 mmol/L — ABNORMAL HIGH (ref 0.0–2.0)
Acid-base deficit: 3 mmol/L — ABNORMAL HIGH (ref 0.0–2.0)
Acid-base deficit: 4 mmol/L — ABNORMAL HIGH (ref 0.0–2.0)
Acid-base deficit: 2 mmol/L (ref 0.0–2.0)
Bicarbonate: 21.9 mEq/L (ref 20.0–24.0)
Bicarbonate: 20.5 mEq/L (ref 20.0–24.0)
Bicarbonate: 23.5 mEq/L (ref 20.0–24.0)
Bicarbonate: 23.7 mEq/L (ref 20.0–24.0)
Bicarbonate: 23 mEq/L (ref 20.0–24.0)
Bicarbonate: 23.3 mEq/L (ref 20.0–24.0)
O2 Saturation: 100 %
O2 Saturation: 100 %
O2 Saturation: 100 %
O2 Saturation: 97 %
O2 Saturation: 83 %
O2 Saturation: 96 %
Patient temperature: 35.1
Patient temperature: 35.1
Patient temperature: 35.8
TCO2: 23 mmol/L (ref 0–100)
TCO2: 22 mmol/L (ref 0–100)
TCO2: 25 mmol/L (ref 0–100)
TCO2: 25 mmol/L (ref 0–100)
TCO2: 24 mmol/L (ref 0–100)
TCO2: 25 mmol/L (ref 0–100)
pCO2 arterial: 41.1 mmHg (ref 35.0–45.0)
pCO2 arterial: 56.6 mmHg — ABNORMAL HIGH (ref 35.0–45.0)
pCO2 arterial: 53.3 mmHg — ABNORMAL HIGH (ref 35.0–45.0)
pCO2 arterial: 42.5 mmHg (ref 35.0–45.0)
pCO2 arterial: 44.3 mmHg (ref 35.0–45.0)
pCO2 arterial: 38.2 mmHg (ref 35.0–45.0)
pH, Arterial: 7.335 — ABNORMAL LOW (ref 7.350–7.450)
pH, Arterial: 7.167 — CL (ref 7.350–7.450)
pH, Arterial: 7.253 — ABNORMAL LOW (ref 7.350–7.450)
pH, Arterial: 7.346 — ABNORMAL LOW (ref 7.350–7.450)
pH, Arterial: 7.313 — ABNORMAL LOW (ref 7.350–7.450)
pH, Arterial: 7.388 (ref 7.350–7.450)
pO2, Arterial: 371 mmHg — ABNORMAL HIGH (ref 80.0–100.0)
pO2, Arterial: 357 mmHg — ABNORMAL HIGH (ref 80.0–100.0)
pO2, Arterial: 214 mmHg — ABNORMAL HIGH (ref 80.0–100.0)
pO2, Arterial: 86 mmHg (ref 80.0–100.0)
pO2, Arterial: 47 mmHg — ABNORMAL LOW (ref 80.0–100.0)
pO2, Arterial: 76 mmHg — ABNORMAL LOW (ref 80.0–100.0)

## 2015-10-28 LAB — HEMOGLOBIN AND HEMATOCRIT, BLOOD
HCT: 23.4 % — ABNORMAL LOW (ref 39.0–52.0)
Hemoglobin: 7.5 g/dL — ABNORMAL LOW (ref 13.0–17.0)

## 2015-10-28 LAB — BASIC METABOLIC PANEL
Anion gap: 8 (ref 5–15)
Anion gap: 8 (ref 5–15)
BUN: 5 mg/dL — ABNORMAL LOW (ref 6–20)
BUN: 8 mg/dL (ref 6–20)
CO2: 22 mmol/L (ref 22–32)
CO2: 24 mmol/L (ref 22–32)
Calcium: 8.5 mg/dL — ABNORMAL LOW (ref 8.9–10.3)
Calcium: 7.4 mg/dL — ABNORMAL LOW (ref 8.9–10.3)
Chloride: 106 mmol/L (ref 101–111)
Chloride: 107 mmol/L (ref 101–111)
Creatinine, Ser: 0.85 mg/dL (ref 0.61–1.24)
Creatinine, Ser: 0.88 mg/dL (ref 0.61–1.24)
GFR calc Af Amer: >60 mL/min (ref >60)
GFR calc Af Amer: >60 mL/min (ref >60)
GFR calc non Af Amer: >60 mL/min (ref >60)
GFR calc non Af Amer: >60 mL/min (ref >60)
Glucose, Bld: 114 mg/dL — ABNORMAL HIGH (ref 65–99)
Glucose, Bld: 148 mg/dL — ABNORMAL HIGH (ref 65–99)
Potassium: 4.6 mmol/L (ref 3.5–5.1)
Potassium: 4.5 mmol/L (ref 3.5–5.1)
Sodium: 136 mmol/L (ref 135–145)
Sodium: 139 mmol/L (ref 135–145)

## 2015-10-28 LAB — MAGNESIUM: Magnesium: 2.7 mg/dL — ABNORMAL HIGH (ref 1.7–2.4)

## 2015-10-28 LAB — PROTIME-INR
INR: 1.66 — AB (ref 0.00–1.49)
PROTHROMBIN TIME: 19.6 s — AB (ref 11.6–15.2)

## 2015-10-28 LAB — HEMOGLOBIN A1C
HEMOGLOBIN A1C: 5.8 % — AB (ref 4.8–5.6)
Hgb A1c MFr Bld: 5.8 % — ABNORMAL HIGH (ref 4.8–5.6)
Mean Plasma Glucose: 120 mg/dL
Mean Plasma Glucose: 120 mg/dL

## 2015-10-28 LAB — APTT: APTT: 36 s (ref 24–37)

## 2015-10-28 LAB — POCT I-STAT 4, (NA,K, GLUC, HGB,HCT)
Glucose, Bld: 176 mg/dL — ABNORMAL HIGH (ref 65–99)
HCT: 42 % (ref 39.0–52.0)
Hemoglobin: 14.3 g/dL (ref 13.0–17.0)
Potassium: 3.4 mmol/L — ABNORMAL LOW (ref 3.5–5.1)
Sodium: 142 mmol/L (ref 135–145)

## 2015-10-28 LAB — FIBRINOGEN: Fibrinogen: 270 mg/dL (ref 204–475)

## 2015-10-28 LAB — PLATELET COUNT: Platelets: 87 10*3/uL — ABNORMAL LOW (ref 150–400)

## 2015-10-28 LAB — PREPARE RBC (CROSSMATCH)

## 2015-10-28 SURGERY — REPAIR, ANEURYSM, AORTA, THORACIC, ASCENDING
Anesthesia: General | Site: Chest

## 2015-10-28 MED ORDER — TRAMADOL HCL 50 MG PO TABS
50.0000 mg | ORAL_TABLET | ORAL | Status: DC | PRN
  Administered 2015-10-31 – 2015-11-01 (×4): 100 mg via ORAL
  Filled 2015-10-28 (×4): qty 2

## 2015-10-28 MED ORDER — MAGNESIUM SULFATE 4 GM/100ML IV SOLN
4.0000 g | Freq: Once | INTRAVENOUS | Status: AC
  Administered 2015-10-28: 4 g via INTRAVENOUS
  Filled 2015-10-28: qty 100

## 2015-10-28 MED ORDER — DOPAMINE-DEXTROSE 3.2-5 MG/ML-% IV SOLN
0.0000 ug/kg/min | INTRAVENOUS | Status: DC
  Administered 2015-10-30: 3 ug/kg/min via INTRAVENOUS
  Filled 2015-10-28: qty 250

## 2015-10-28 MED ORDER — LEVALBUTEROL HCL 1.25 MG/0.5ML IN NEBU
1.2500 mg | INHALATION_SOLUTION | Freq: Four times a day (QID) | RESPIRATORY_TRACT | Status: DC
  Administered 2015-10-29 – 2015-10-31 (×10): 1.25 mg via RESPIRATORY_TRACT
  Filled 2015-10-28 (×10): qty 0.5

## 2015-10-28 MED ORDER — DEXTROSE 5 % IV SOLN
1.5000 g | Freq: Two times a day (BID) | INTRAVENOUS | Status: AC
  Administered 2015-10-28 – 2015-10-30 (×4): 1.5 g via INTRAVENOUS
  Filled 2015-10-28 (×4): qty 1.5

## 2015-10-28 MED ORDER — LACTATED RINGERS IV SOLN
INTRAVENOUS | Status: DC | PRN
  Administered 2015-10-28 (×3): via INTRAVENOUS

## 2015-10-28 MED ORDER — VANCOMYCIN HCL IN DEXTROSE 1-5 GM/200ML-% IV SOLN
1000.0000 mg | Freq: Once | INTRAVENOUS | Status: DC
  Filled 2015-10-28: qty 200

## 2015-10-28 MED ORDER — METOPROLOL TARTRATE 12.5 MG HALF TABLET: 12.5000 mg | ORAL_TABLET | Freq: Two times a day (BID) | ORAL | Status: DC

## 2015-10-28 MED ORDER — SODIUM CHLORIDE 0.9 % IV SOLN
250.0000 [IU] | INTRAVENOUS | Status: DC | PRN
  Administered 2015-10-28: 1.1 [IU]/h via INTRAVENOUS

## 2015-10-28 MED ORDER — 0.9 % SODIUM CHLORIDE (POUR BTL) OPTIME
TOPICAL | Status: DC | PRN
  Administered 2015-10-28: 6000 mL

## 2015-10-28 MED ORDER — SODIUM CHLORIDE 0.9 % IV SOLN: 250.0000 mL | INTRAVENOUS | Status: DC

## 2015-10-28 MED ORDER — INSULIN ASPART 100 UNIT/ML ~~LOC~~ SOLN
SUBCUTANEOUS | Status: DC | PRN
  Administered 2015-10-28: 10 [IU] via SUBCUTANEOUS

## 2015-10-28 MED ORDER — HEMOSTATIC AGENTS (NO CHARGE) OPTIME
TOPICAL | Status: DC | PRN
  Administered 2015-10-28 (×6): 1 via TOPICAL

## 2015-10-28 MED ORDER — CHLORHEXIDINE GLUCONATE 0.12 % MT SOLN
15.0000 mL | OROMUCOSAL | Status: AC
  Administered 2015-10-28: 15 mL via OROMUCOSAL

## 2015-10-28 MED ORDER — LACTATED RINGERS IV SOLN
INTRAVENOUS | Status: DC
  Administered 2015-10-31: 20 mL/h via INTRAVENOUS

## 2015-10-28 MED ORDER — BUDESONIDE-FORMOTEROL FUMARATE 160-4.5 MCG/ACT IN AERO
2.0000 | INHALATION_SPRAY | Freq: Two times a day (BID) | RESPIRATORY_TRACT | Status: DC
  Administered 2015-10-30 – 2015-11-05 (×12): 2 via RESPIRATORY_TRACT
  Filled 2015-10-28 (×2): qty 6

## 2015-10-28 MED ORDER — EPINEPHRINE HCL 0.1 MG/ML IJ SOSY
PREFILLED_SYRINGE | INTRAMUSCULAR | Status: AC
  Filled 2015-10-28: qty 10

## 2015-10-28 MED ORDER — OXYCODONE HCL 5 MG PO TABS: 5.0000 mg | ORAL_TABLET | ORAL | Status: DC | PRN

## 2015-10-28 MED ORDER — EPINEPHRINE HCL 1 MG/ML IJ SOLN
0.0000 ug/min | INTRAMUSCULAR | Status: DC
  Administered 2015-10-29 (×2): 3 ug/min via INTRAVENOUS
  Filled 2015-10-28 (×2): qty 4

## 2015-10-28 MED ORDER — HYDROCORTISONE NA SUCCINATE PF 100 MG IJ SOLR
INTRAMUSCULAR | Status: DC | PRN
  Administered 2015-10-28: 250 mg via INTRAVENOUS

## 2015-10-28 MED ORDER — ARTIFICIAL TEARS OP OINT
TOPICAL_OINTMENT | OPHTHALMIC | Status: DC | PRN
  Administered 2015-10-28: 1 via OPHTHALMIC

## 2015-10-28 MED ORDER — SUFENTANIL CITRATE 50 MCG/ML IV SOLN
INTRAVENOUS | Status: DC | PRN
  Administered 2015-10-28: 20 ug via INTRAVENOUS
  Administered 2015-10-28: 5 ug via INTRAVENOUS
  Administered 2015-10-28: 30 ug via INTRAVENOUS
  Administered 2015-10-28: 20 ug via INTRAVENOUS
  Administered 2015-10-28: 30 ug via INTRAVENOUS
  Administered 2015-10-28: 25 ug via INTRAVENOUS
  Administered 2015-10-28: 50 ug via INTRAVENOUS
  Administered 2015-10-28: 20 ug via INTRAVENOUS

## 2015-10-28 MED ORDER — NOREPINEPHRINE BITARTRATE 1 MG/ML IV SOLN
0.0000 ug/min | INTRAVENOUS | Status: DC
  Administered 2015-10-29: 3 ug/min via INTRAVENOUS
  Filled 2015-10-28: qty 4

## 2015-10-28 MED ORDER — METOPROLOL TARTRATE 1 MG/ML IV SOLN
2.5000 mg | INTRAVENOUS | Status: DC | PRN
  Administered 2015-10-30 (×2): 2.5 mg via INTRAVENOUS
  Filled 2015-10-28 (×2): qty 5

## 2015-10-28 MED ORDER — VECURONIUM BROMIDE 10 MG IV SOLR
INTRAVENOUS | Status: AC
  Filled 2015-10-28: qty 10

## 2015-10-28 MED ORDER — INSULIN REGULAR BOLUS VIA INFUSION
0.0000 [IU] | Freq: Three times a day (TID) | INTRAVENOUS | Status: DC
  Filled 2015-10-28: qty 10

## 2015-10-28 MED ORDER — MORPHINE SULFATE (PF) 2 MG/ML IV SOLN
1.0000 mg | INTRAVENOUS | Status: AC | PRN
  Administered 2015-10-28 – 2015-10-29 (×5): 4 mg via INTRAVENOUS
  Filled 2015-10-28 (×5): qty 2

## 2015-10-28 MED ORDER — ETOMIDATE 2 MG/ML IV SOLN
INTRAVENOUS | Status: DC | PRN
  Administered 2015-10-28: 8 mg via INTRAVENOUS
  Administered 2015-10-28: 20 mg via INTRAVENOUS

## 2015-10-28 MED ORDER — PHENYLEPHRINE HCL 10 MG/ML IJ SOLN
20.0000 mg | INTRAVENOUS | Status: DC | PRN
  Administered 2015-10-28: 25 ug/min via INTRAVENOUS

## 2015-10-28 MED ORDER — MILRINONE IN DEXTROSE 20 MG/100ML IV SOLN
0.3000 ug/kg/min | INTRAVENOUS | Status: DC
  Administered 2015-10-28 – 2015-10-30 (×6): 0.3 ug/kg/min via INTRAVENOUS
  Filled 2015-10-28 (×6): qty 100

## 2015-10-28 MED ORDER — HEMOSTATIC AGENTS (NO CHARGE) OPTIME
TOPICAL | Status: DC | PRN
  Administered 2015-10-28: 1 via TOPICAL

## 2015-10-28 MED ORDER — BISACODYL 5 MG PO TBEC
10.0000 mg | DELAYED_RELEASE_TABLET | Freq: Every day | ORAL | Status: DC
  Administered 2015-11-01 – 2015-11-05 (×5): 10 mg via ORAL
  Filled 2015-10-28 (×4): qty 2

## 2015-10-28 MED ORDER — ACETAMINOPHEN 650 MG RE SUPP
650.0000 mg | Freq: Once | RECTAL | Status: AC
  Administered 2015-10-28: 650 mg via RECTAL

## 2015-10-28 MED ORDER — MIDAZOLAM HCL 5 MG/5ML IJ SOLN
INTRAMUSCULAR | Status: DC | PRN
  Administered 2015-10-28: 2 mg via INTRAVENOUS
  Administered 2015-10-28: 3 mg via INTRAVENOUS
  Administered 2015-10-28: 2 mg via INTRAVENOUS
  Administered 2015-10-28: 3 mg via INTRAVENOUS
  Administered 2015-10-28: 2 mg via INTRAVENOUS

## 2015-10-28 MED ORDER — AMIODARONE HCL IN DEXTROSE 360-4.14 MG/200ML-% IV SOLN
30.0000 mg/h | INTRAVENOUS | Status: DC
  Administered 2015-10-29 (×2): 30 mg/h via INTRAVENOUS
  Filled 2015-10-28 (×5): qty 200

## 2015-10-28 MED ORDER — AMINOCAPROIC ACID 250 MG/ML IV SOLN
0.5000 g/h | Freq: Once | INTRAVENOUS | Status: DC
  Filled 2015-10-28: qty 20

## 2015-10-28 MED ORDER — LACTATED RINGERS IV SOLN: 500.0000 mL | Freq: Once | INTRAVENOUS | Status: DC | PRN

## 2015-10-28 MED ORDER — VECURONIUM BROMIDE 10 MG IV SOLR
INTRAVENOUS | Status: DC | PRN
  Administered 2015-10-28: 7 mg via INTRAVENOUS
  Administered 2015-10-28: 6 mg via INTRAVENOUS
  Administered 2015-10-28: 3 mg via INTRAVENOUS
  Administered 2015-10-28 (×2): 4 mg via INTRAVENOUS
  Administered 2015-10-28: 6 mg via INTRAVENOUS

## 2015-10-28 MED ORDER — PHENYLEPHRINE HCL 10 MG/ML IJ SOLN
0.0000 ug/min | INTRAMUSCULAR | Status: DC
  Filled 2015-10-28 (×2): qty 2

## 2015-10-28 MED ORDER — PROPOFOL 10 MG/ML IV BOLUS
INTRAVENOUS | Status: AC
Start: 2015-10-28 — End: 2015-10-28
  Filled 2015-10-28: qty 20

## 2015-10-28 MED ORDER — PHENYLEPHRINE HCL 10 MG/ML IJ SOLN: 30.0000 ug/min | INTRAMUSCULAR | Status: DC

## 2015-10-28 MED ORDER — MIDAZOLAM HCL 10 MG/2ML IJ SOLN
INTRAMUSCULAR | Status: AC
  Filled 2015-10-28: qty 2

## 2015-10-28 MED ORDER — METOPROLOL TARTRATE 25 MG/10 ML ORAL SUSPENSION: 12.5000 mg | Freq: Two times a day (BID) | ORAL | Status: DC

## 2015-10-28 MED ORDER — LEVALBUTEROL HCL 1.25 MG/0.5ML IN NEBU
1.2500 mg | INHALATION_SOLUTION | Freq: Four times a day (QID) | RESPIRATORY_TRACT | Status: DC
  Administered 2015-10-28: 1.25 mg via RESPIRATORY_TRACT
  Filled 2015-10-28: qty 0.5

## 2015-10-28 MED ORDER — HYDROCORTISONE NA SUCCINATE PF 250 MG IJ SOLR
INTRAMUSCULAR | Status: AC
  Filled 2015-10-28: qty 250

## 2015-10-28 MED ORDER — PANTOPRAZOLE SODIUM 40 MG PO TBEC
40.0000 mg | DELAYED_RELEASE_TABLET | Freq: Every day | ORAL | Status: DC
  Administered 2015-10-31 – 2015-11-05 (×6): 40 mg via ORAL
  Filled 2015-10-28 (×5): qty 1

## 2015-10-28 MED ORDER — AMIODARONE HCL IN DEXTROSE 360-4.14 MG/200ML-% IV SOLN
INTRAVENOUS | Status: DC | PRN
  Administered 2015-10-28: 900 mg/h via INTRAVENOUS

## 2015-10-28 MED ORDER — SODIUM CHLORIDE 0.9 % IV SOLN
INTRAVENOUS | Status: DC
  Administered 2015-10-29: 10:00:00 via INTRAVENOUS

## 2015-10-28 MED ORDER — VANCOMYCIN HCL IN DEXTROSE 1-5 GM/200ML-% IV SOLN
1000.0000 mg | Freq: Two times a day (BID) | INTRAVENOUS | Status: AC
  Administered 2015-10-28 – 2015-10-29 (×3): 1000 mg via INTRAVENOUS
  Filled 2015-10-28 (×3): qty 200

## 2015-10-28 MED ORDER — SODIUM CHLORIDE 0.9 % IV SOLN
INTRAVENOUS | Status: DC
  Administered 2015-10-29: 3 [IU]/h via INTRAVENOUS
  Filled 2015-10-28 (×3): qty 2.5

## 2015-10-28 MED ORDER — ASPIRIN EC 325 MG PO TBEC
325.0000 mg | DELAYED_RELEASE_TABLET | Freq: Every day | ORAL | Status: DC
  Administered 2015-10-31 – 2015-11-05 (×6): 325 mg via ORAL
  Filled 2015-10-28 (×5): qty 1

## 2015-10-28 MED ORDER — MORPHINE SULFATE (PF) 2 MG/ML IV SOLN
2.0000 mg | INTRAVENOUS | Status: DC | PRN
  Administered 2015-10-28: 2 mg via INTRAVENOUS
  Administered 2015-10-29 (×8): 4 mg via INTRAVENOUS
  Administered 2015-10-30 (×2): 2 mg via INTRAVENOUS
  Administered 2015-10-30: 4 mg via INTRAVENOUS
  Administered 2015-10-30 (×4): 2 mg via INTRAVENOUS
  Administered 2015-10-30: 4 mg via INTRAVENOUS
  Filled 2015-10-28 (×2): qty 2
  Filled 2015-10-28: qty 1
  Filled 2015-10-28: qty 2
  Filled 2015-10-28: qty 1
  Filled 2015-10-28: qty 2
  Filled 2015-10-28: qty 1
  Filled 2015-10-28 (×8): qty 2
  Filled 2015-10-28: qty 1
  Filled 2015-10-28: qty 2

## 2015-10-28 MED ORDER — DEXTROSE 50 % IV SOLN
INTRAVENOUS | Status: DC | PRN
  Administered 2015-10-28 (×2): 25 g via INTRAVENOUS

## 2015-10-28 MED ORDER — SODIUM CHLORIDE 0.9 % IV SOLN: Freq: Once | INTRAVENOUS | Status: DC

## 2015-10-28 MED ORDER — ACETAMINOPHEN 160 MG/5ML PO SOLN
1000.0000 mg | Freq: Four times a day (QID) | ORAL | Status: DC
  Administered 2015-10-29 – 2015-10-30 (×6): 1000 mg
  Filled 2015-10-28 (×5): qty 40.6

## 2015-10-28 MED ORDER — SUFENTANIL CITRATE 250 MCG/5ML IV SOLN
INTRAVENOUS | Status: AC
  Filled 2015-10-28: qty 5

## 2015-10-28 MED ORDER — ETOMIDATE 2 MG/ML IV SOLN
INTRAVENOUS | Status: AC
  Filled 2015-10-28: qty 10

## 2015-10-28 MED ORDER — ACETAMINOPHEN 500 MG PO TABS
1000.0000 mg | ORAL_TABLET | Freq: Four times a day (QID) | ORAL | Status: AC
  Administered 2015-10-30 – 2015-11-02 (×12): 1000 mg via ORAL
  Filled 2015-10-28 (×12): qty 2

## 2015-10-28 MED ORDER — ACETAMINOPHEN 160 MG/5ML PO SOLN: 650.0000 mg | Freq: Once | ORAL | Status: AC

## 2015-10-28 MED ORDER — PHENYLEPHRINE HCL 10 MG/ML IJ SOLN
0.0000 ug/min | INTRAVENOUS | Status: DC
  Filled 2015-10-28: qty 2

## 2015-10-28 MED ORDER — POTASSIUM CHLORIDE 10 MEQ/50ML IV SOLN
10.0000 meq | INTRAVENOUS | Status: AC
  Administered 2015-10-28 (×3): 10 meq via INTRAVENOUS

## 2015-10-28 MED ORDER — CALCIUM CHLORIDE 10 % IV SOLN
INTRAVENOUS | Status: AC
  Filled 2015-10-28: qty 10

## 2015-10-28 MED ORDER — SODIUM CHLORIDE 0.9% FLUSH: 3.0000 mL | INTRAVENOUS | Status: DC | PRN

## 2015-10-28 MED ORDER — DOCUSATE SODIUM 100 MG PO CAPS
200.0000 mg | ORAL_CAPSULE | Freq: Every day | ORAL | Status: DC
  Administered 2015-10-31 – 2015-11-05 (×6): 200 mg via ORAL
  Filled 2015-10-28 (×5): qty 2

## 2015-10-28 MED ORDER — ALBUMIN HUMAN 5 % IV SOLN
250.0000 mL | INTRAVENOUS | Status: AC | PRN
  Administered 2015-10-28 – 2015-10-29 (×3): 250 mL via INTRAVENOUS
  Filled 2015-10-28: qty 250

## 2015-10-28 MED ORDER — BISACODYL 10 MG RE SUPP: 10.0000 mg | Freq: Every day | RECTAL | Status: DC

## 2015-10-28 MED ORDER — NOREPINEPHRINE BITARTRATE 1 MG/ML IV SOLN
0.0000 ug/min | INTRAVENOUS | Status: AC
  Administered 2015-10-28: 2 ug/min via INTRAVENOUS
  Filled 2015-10-28: qty 4

## 2015-10-28 MED ORDER — LACTATED RINGERS IV SOLN: INTRAVENOUS | Status: DC

## 2015-10-28 MED ORDER — NITROGLYCERIN IN D5W 200-5 MCG/ML-% IV SOLN: 0.0000 ug/min | INTRAVENOUS | Status: DC

## 2015-10-28 MED ORDER — DEXMEDETOMIDINE HCL IN NACL 400 MCG/100ML IV SOLN
0.4000 ug/kg/h | INTRAVENOUS | Status: DC
  Administered 2015-10-28 (×3): 0.7 ug/kg/h via INTRAVENOUS
  Administered 2015-10-29: 1 ug/kg/h via INTRAVENOUS
  Administered 2015-10-29: 1.2 ug/kg/h via INTRAVENOUS
  Administered 2015-10-29: 0.9 ug/kg/h via INTRAVENOUS
  Administered 2015-10-29 (×2): 0.7 ug/kg/h via INTRAVENOUS
  Administered 2015-10-29: 1.1 ug/kg/h via INTRAVENOUS
  Administered 2015-10-29: 1 ug/kg/h via INTRAVENOUS
  Administered 2015-10-29: 1.1 ug/kg/h via INTRAVENOUS
  Administered 2015-10-29: 0.8 ug/kg/h via INTRAVENOUS
  Administered 2015-10-30 (×2): 1.2 ug/kg/h via INTRAVENOUS
  Filled 2015-10-28 (×3): qty 50
  Filled 2015-10-28: qty 200
  Filled 2015-10-28: qty 50
  Filled 2015-10-28: qty 100
  Filled 2015-10-28 (×2): qty 50
  Filled 2015-10-28 (×2): qty 100
  Filled 2015-10-28 (×3): qty 50
  Filled 2015-10-28: qty 100

## 2015-10-28 MED ORDER — HEPARIN SODIUM (PORCINE) 1000 UNIT/ML IJ SOLN
INTRAMUSCULAR | Status: DC | PRN
  Administered 2015-10-28: 27000 [IU] via INTRAVENOUS

## 2015-10-28 MED ORDER — HEPARIN SODIUM (PORCINE) 1000 UNIT/ML IJ SOLN
INTRAMUSCULAR | Status: AC
  Filled 2015-10-28: qty 3

## 2015-10-28 MED ORDER — MILRINONE IN DEXTROSE 20 MG/100ML IV SOLN
0.1250 ug/kg/min | INTRAVENOUS | Status: AC
  Administered 2015-10-28: .3 ug/kg/min via INTRAVENOUS
  Filled 2015-10-28: qty 100

## 2015-10-28 MED ORDER — FAMOTIDINE IN NACL 20-0.9 MG/50ML-% IV SOLN
20.0000 mg | Freq: Two times a day (BID) | INTRAVENOUS | Status: AC
  Administered 2015-10-28 – 2015-10-29 (×2): 20 mg via INTRAVENOUS
  Filled 2015-10-28 (×2): qty 50

## 2015-10-28 MED ORDER — ASPIRIN 81 MG PO CHEW
324.0000 mg | CHEWABLE_TABLET | Freq: Every day | ORAL | Status: DC
  Administered 2015-10-29 – 2015-10-30 (×2): 324 mg
  Filled 2015-10-28 (×2): qty 4

## 2015-10-28 MED ORDER — SODIUM CHLORIDE 0.9 % IV SOLN
INTRAVENOUS | Status: AC
Start: 2015-10-28 — End: 2015-10-29
  Filled 2015-10-28: qty 2.5

## 2015-10-28 MED ORDER — PROTAMINE SULFATE 10 MG/ML IV SOLN
INTRAVENOUS | Status: DC | PRN
Start: 2015-10-28 — End: 2015-10-28
  Administered 2015-10-28: 250 mg via INTRAVENOUS

## 2015-10-28 MED ORDER — PROPOFOL 10 MG/ML IV BOLUS
INTRAVENOUS | Status: DC | PRN
  Administered 2015-10-28 (×2): 50 mg via INTRAVENOUS

## 2015-10-28 MED ORDER — LACTATED RINGERS IV SOLN
INTRAVENOUS | Status: DC | PRN
  Administered 2015-10-28 (×2): via INTRAVENOUS

## 2015-10-28 MED ORDER — SODIUM CHLORIDE 0.45 % IV SOLN
INTRAVENOUS | Status: DC | PRN
  Administered 2015-10-30: 03:00:00 via INTRAVENOUS

## 2015-10-28 MED ORDER — ONDANSETRON HCL 4 MG/2ML IJ SOLN: 4.0000 mg | Freq: Four times a day (QID) | INTRAMUSCULAR | Status: DC | PRN

## 2015-10-28 MED ORDER — AMIODARONE HCL IN DEXTROSE 360-4.14 MG/200ML-% IV SOLN
60.0000 mg/h | INTRAVENOUS | Status: AC
  Administered 2015-10-28: 60 mg/h via INTRAVENOUS
  Filled 2015-10-28: qty 200

## 2015-10-28 MED ORDER — MIDAZOLAM HCL 2 MG/2ML IJ SOLN
2.0000 mg | INTRAMUSCULAR | Status: DC | PRN
  Administered 2015-10-28 – 2015-10-30 (×17): 2 mg via INTRAVENOUS
  Filled 2015-10-28 (×17): qty 2

## 2015-10-28 MED ORDER — LACTATED RINGERS IV SOLN
INTRAVENOUS | Status: DC | PRN
  Administered 2015-10-28: 07:00:00 via INTRAVENOUS

## 2015-10-28 MED ORDER — MIDAZOLAM HCL 2 MG/2ML IJ SOLN
INTRAMUSCULAR | Status: AC
  Filled 2015-10-28: qty 2

## 2015-10-28 MED ORDER — ESMOLOL HCL 100 MG/10ML IV SOLN
INTRAVENOUS | Status: DC | PRN
  Administered 2015-10-28: 40 mg via INTRAVENOUS

## 2015-10-28 MED ORDER — SODIUM CHLORIDE 0.9% FLUSH
3.0000 mL | Freq: Two times a day (BID) | INTRAVENOUS | Status: DC
  Administered 2015-10-29 – 2015-10-30 (×5): 3 mL via INTRAVENOUS

## 2015-10-28 MED ORDER — SODIUM CHLORIDE 0.9 % IJ SOLN
OROMUCOSAL | Status: DC | PRN
  Administered 2015-10-28 (×3): 4 mL via TOPICAL

## 2015-10-28 MED ORDER — ESMOLOL HCL 100 MG/10ML IV SOLN
INTRAVENOUS | Status: AC
  Filled 2015-10-28: qty 10

## 2015-10-28 MED FILL — Heparin Sodium (Porcine) Inj 1000 Unit/ML: INTRAMUSCULAR | Qty: 30 | Status: AC

## 2015-10-28 MED FILL — Magnesium Sulfate Inj 50%: INTRAMUSCULAR | Qty: 2 | Status: AC

## 2015-10-28 MED FILL — Potassium Chloride Inj 2 mEq/ML: INTRAVENOUS | Qty: 40 | Status: AC

## 2015-10-28 SURGICAL SUPPLY — 120 items
ADAPTER CARDIO PERF ANTE/RETRO (ADAPTER) ×4 IMPLANT
ADH SKN CLS APL DERMABOND .7 (GAUZE/BANDAGES/DRESSINGS) ×2
ADH SRG 12 PREFL SYR 3 SPRDR (MISCELLANEOUS) ×4
ADPR PRFSN 84XANTGRD RTRGD (ADAPTER) ×2
AGENT HMST MTR 8 SURGIFLO (HEMOSTASIS) ×2
APL SRG 7X2 LUM MLBL SLNT (VASCULAR PRODUCTS)
APPLICATOR TIP COSEAL (VASCULAR PRODUCTS) IMPLANT
BAG DECANTER FOR FLEXI CONT (MISCELLANEOUS) ×4 IMPLANT
BALLN LINEAR 7.5FR IABP 40CC (BALLOONS) ×4
BALLOON LINEAR 7.5FR IABP 40CC (BALLOONS) IMPLANT
BLADE STERNUM SYSTEM 6 (BLADE) IMPLANT
BLADE SURG 15 STRL LF DISP TIS (BLADE) ×2 IMPLANT
BLADE SURG 15 STRL SS (BLADE) ×4
BLADE SURG ROTATE 9660 (MISCELLANEOUS) IMPLANT
CANISTER SUCTION 2500CC (MISCELLANEOUS) ×4 IMPLANT
CANNULA EZ GLIDE AORTIC 21FR (CANNULA) ×2 IMPLANT
CANNULA GUNDRY RCSP 15FR (MISCELLANEOUS) ×10 IMPLANT
CANNULA SUMP PERICARDIAL (CANNULA) ×2 IMPLANT
CANNULA VENOUS LOW PROF 34X46 (CANNULA) ×2 IMPLANT
CATH FOLEY 2WAY SLVR 18FR 30CC (CATHETERS) ×2 IMPLANT
CATH HEART VENT LEFT (CATHETERS) ×2 IMPLANT
CATH ROBINSON RED A/P 18FR (CATHETERS) ×2 IMPLANT
CATH THORACIC 28FR (CATHETERS) IMPLANT
CATH THORACIC 36FR RT ANG (CATHETERS) ×4 IMPLANT
CATH/SQUID NICHOLS JEHLE COR (CATHETERS) ×6 IMPLANT
CAUTERY EYE LOW TEMP 1300F FIN (OPHTHALMIC RELATED) ×4 IMPLANT
CONN Y 3/8X3/8X3/8  BEN (MISCELLANEOUS) ×4
CONN Y 3/8X3/8X3/8 BEN (MISCELLANEOUS) ×2 IMPLANT
CONT SPEC 4OZ CLIKSEAL STRL BL (MISCELLANEOUS) ×4 IMPLANT
COVER SURGICAL LIGHT HANDLE (MISCELLANEOUS) ×4 IMPLANT
CRADLE DONUT ADULT HEAD (MISCELLANEOUS) IMPLANT
DERMABOND ADVANCED (GAUZE/BANDAGES/DRESSINGS) ×2
DERMABOND ADVANCED .7 DNX12 (GAUZE/BANDAGES/DRESSINGS) IMPLANT
DRSG AQUACEL AG ADV 3.5X14 (GAUZE/BANDAGES/DRESSINGS) ×4 IMPLANT
ELECT REM PT RETURN 9FT ADLT (ELECTROSURGICAL) ×8
ELECTRODE REM PT RTRN 9FT ADLT (ELECTROSURGICAL) ×4 IMPLANT
EOPA ×2 IMPLANT
FELT TEFLON 6X6 (MISCELLANEOUS) ×4 IMPLANT
GAUZE SPONGE 4X4 12PLY STRL (GAUZE/BANDAGES/DRESSINGS) ×8 IMPLANT
GLOVE BIO SURGEON STRL SZ 6 (GLOVE) IMPLANT
GLOVE BIO SURGEON STRL SZ 6.5 (GLOVE) ×2 IMPLANT
GLOVE BIO SURGEON STRL SZ7 (GLOVE) IMPLANT
GLOVE BIO SURGEON STRL SZ7.5 (GLOVE) ×16 IMPLANT
GLOVE BIO SURGEONS STRL SZ 6.5 (GLOVE) ×2
GLOVE BIOGEL PI IND STRL 6 (GLOVE) IMPLANT
GLOVE BIOGEL PI IND STRL 6.5 (GLOVE) IMPLANT
GLOVE BIOGEL PI INDICATOR 6 (GLOVE) ×2
GLOVE BIOGEL PI INDICATOR 6.5 (GLOVE) ×8
GOWN STRL REUS W/ TWL LRG LVL3 (GOWN DISPOSABLE) ×8 IMPLANT
GOWN STRL REUS W/TWL LRG LVL3 (GOWN DISPOSABLE) ×16
GRAFT BRANCH HEMA 14X10X10X30 (Prosthesis & Implant Heart) IMPLANT
GRAFT WOVEN D/V 28DX30L (Vascular Products) ×2 IMPLANT
HEMOSTAT POWDER SURGIFOAM 1G (HEMOSTASIS) ×12 IMPLANT
HEMOSTAT SURGICEL 2X14 (HEMOSTASIS) ×4 IMPLANT
INSERT FOGARTY XLG (MISCELLANEOUS) ×2 IMPLANT
KIT BASIN OR (CUSTOM PROCEDURE TRAY) ×4 IMPLANT
KIT DILATOR VASC 18G NDL (KITS) ×4 IMPLANT
KIT DRAINAGE VACCUM ASSIST (KITS) ×2 IMPLANT
KIT ROOM TURNOVER OR (KITS) ×4 IMPLANT
KIT SUCTION CATH 14FR (SUCTIONS) ×4 IMPLANT
LINE VENT (MISCELLANEOUS) ×2 IMPLANT
LOOP VESSEL SUPERMAXI WHITE (MISCELLANEOUS) ×4 IMPLANT
NDL AORTIC AIR ASPIRATING (NEEDLE) IMPLANT
NDL SUT 1 .5 CRC FRENCH EYE (NEEDLE) IMPLANT
NEEDLE AORTIC AIR ASPIRATING (NEEDLE) ×4 IMPLANT
NEEDLE FRENCH EYE (NEEDLE) ×4
NS IRRIG 1000ML POUR BTL (IV SOLUTION) ×16 IMPLANT
PACK OPEN HEART (CUSTOM PROCEDURE TRAY) ×4 IMPLANT
PAD ARMBOARD 7.5X6 YLW CONV (MISCELLANEOUS) ×8 IMPLANT
PAD DEFIB R2 (MISCELLANEOUS) ×4 IMPLANT
PEDIATRIC SUCKERS (MISCELLANEOUS) ×4 IMPLANT
SEALANT SURG COSEAL 8ML (VASCULAR PRODUCTS) IMPLANT
SET CARDIOPLEGIA MPS 5001102 (MISCELLANEOUS) ×2 IMPLANT
SET VEIN GRAFT PERF (SET/KITS/TRAYS/PACK) ×2 IMPLANT
SPOGE SURGIFLO 8M (HEMOSTASIS) ×2
SPONGE GAUZE 4X4 12PLY STER LF (GAUZE/BANDAGES/DRESSINGS) ×2 IMPLANT
SPONGE LAP 18X18 X RAY DECT (DISPOSABLE) ×4 IMPLANT
SPONGE LAP 4X18 X RAY DECT (DISPOSABLE) IMPLANT
SPONGE SURGIFLO 8M (HEMOSTASIS) IMPLANT
STOPCOCK 4 WAY LG BORE MALE ST (IV SETS) ×6 IMPLANT
SUT PROLENE 3 0 RB 1 (SUTURE) ×4 IMPLANT
SUT PROLENE 3 0 SH DA (SUTURE) ×8 IMPLANT
SUT PROLENE 4 0 RB 1 (SUTURE) ×96
SUT PROLENE 4 0 SH DA (SUTURE) ×34 IMPLANT
SUT PROLENE 4-0 RB1 .5 CRCL 36 (SUTURE) IMPLANT
SUT PROLENE 5 0 C 1 36 (SUTURE) ×4 IMPLANT
SUT PROLENE 6 0 CC (SUTURE) IMPLANT
SUT SILK 2 0 SH CR/8 (SUTURE) ×2 IMPLANT
SUT STEEL SZ 6 DBL 3X14 BALL (SUTURE) ×4 IMPLANT
SUT VIC AB 1 CT1 18XCR BRD 8 (SUTURE) IMPLANT
SUT VIC AB 1 CT1 8-18 (SUTURE)
SUT VIC AB 1 CTX 18 (SUTURE) ×2 IMPLANT
SUT VIC AB 1 CTX 27 (SUTURE) ×8 IMPLANT
SUT VIC AB 1 CTX 36 (SUTURE) ×4
SUT VIC AB 1 CTX36XBRD ANBCTR (SUTURE) IMPLANT
SUT VIC AB 2-0 CT1 27 (SUTURE)
SUT VIC AB 2-0 CT1 TAPERPNT 27 (SUTURE) IMPLANT
SUT VIC AB 2-0 CTX 27 (SUTURE) ×2 IMPLANT
SUT VIC AB 2-0 CTX 36 (SUTURE) IMPLANT
SUT VIC AB 3-0 SH 27 (SUTURE)
SUT VIC AB 3-0 SH 27X BRD (SUTURE) IMPLANT
SUT VIC AB 3-0 X1 27 (SUTURE) ×2 IMPLANT
SUT VICRYL 4-0 PS2 18IN ABS (SUTURE) IMPLANT
SUTURE E-PAK OPEN HEART (SUTURE) ×4 IMPLANT
SYR 10ML KIT SKIN ADHESIVE (MISCELLANEOUS) ×6 IMPLANT
SYR 30ML SLIP (SYRINGE) ×2 IMPLANT
SYR 5ML LL (SYRINGE) ×2 IMPLANT
SYSTEM SAHARA CHEST DRAIN ATS (WOUND CARE) ×4 IMPLANT
TAPE CLOTH SURG 4X10 WHT LF (GAUZE/BANDAGES/DRESSINGS) ×2 IMPLANT
TAPE PAPER 2X10 WHT MICROPORE (GAUZE/BANDAGES/DRESSINGS) ×2 IMPLANT
TOWEL OR 17X24 6PK STRL BLUE (TOWEL DISPOSABLE) ×8 IMPLANT
TOWEL OR 17X26 10 PK STRL BLUE (TOWEL DISPOSABLE) ×8 IMPLANT
TRAY CATH LUMEN 1 20CM STRL (SET/KITS/TRAYS/PACK) ×6 IMPLANT
TUBE CONNECTING 20'X1/4 (TUBING) ×1
TUBE CONNECTING 20X1/4 (TUBING) ×1 IMPLANT
TUBE SUCT INTRACARD DLP 20F (MISCELLANEOUS) ×2 IMPLANT
TUBING ART PRESS 48 MALE/FEM (TUBING) ×12 IMPLANT
VENT LEFT HEART 12002 (CATHETERS) ×4
WATER STERILE IRR 1000ML POUR (IV SOLUTION) ×8 IMPLANT
YANKAUER SUCT BULB TIP NO VENT (SUCTIONS) ×4 IMPLANT

## 2015-10-28 NOTE — Progress Notes (Addendum)
Per Mr. Lovings request his cell phone was given to his sister to hold on to. [employer] armband/bracelet placed in patient belonging bag per request. Mr. Marczak wants belonging bag containing clothes to remain in his room. Thresa Ross RN

## 2015-10-28 NOTE — Progress Notes (Signed)
Dr. Maren Beach aware of 12 lead EKG results.

## 2015-10-28 NOTE — Brief Op Note (Signed)
10/25/2015 - 10/28/2015  12:57 PM      [address]       [place] [address]             [phone]     10/25/2015 - 10/28/2015  12:58 PM  PATIENT:  [name]  55 y.o. male  PRE-OPERATIVE DIAGNOSIS:  AORTIC DISSECTION  POST-OPERATIVE DIAGNOSIS:  AORTIC DISSECTION  PROCEDURE:  Procedure(s): TYPE A AORTIC DISSECTION REPAIR WITH  28 HEMASHIELD AORTO -AORTIC ANASTAMOSIS , CIRC ARREST TRANSESOPHAGEAL ECHOCARDIOGRAM (TEE)  SURGEON:  Surgeon(s): Kerin Perna, MD  PHYSICIAN ASSISTANT: WAYNE GOLD PA-C  ANESTHESIA:   general  PATIENT CONDITION:  ICU - intubated and hemodynamically stable.  PRE-OPERATIVE WEIGHT: 111 kg  COMPLICATIONS: NO KNOWN

## 2015-10-28 NOTE — Progress Notes (Signed)
  Echocardiogram Echocardiogram Transesophageal has been performed.  Janalyn Harder 10/28/2015, 8:32 AM

## 2015-10-28 NOTE — Transfer of Care (Signed)
Immediate Anesthesia Transfer of Care Note  Patient: [name]  Procedure(s) Performed: Procedure(s): TYPE A AORTIC DISSECTION REPAIR WITH CIRC ARREST (N/A) TRANSESOPHAGEAL ECHOCARDIOGRAM (TEE) (N/A)  Patient Location: ICU  Anesthesia Type:General  Level of Consciousness: unresponsive and Patient remains intubated per anesthesia plan  Airway & Oxygen Therapy: Patient remains intubated per anesthesia plan and Patient placed on Ventilator (see vital sign flow sheet for setting)  Post-op Assessment: Report given to RN, Post -op Vital signs reviewed and stable and Patient moving all extremities  Post vital signs: Reviewed and stable  Last Vitals:  Filed Vitals:   10/28/15 0605 10/28/15 0610  BP:    Pulse: 80 79  Temp:    Resp: 29 26    Complications: No apparent anesthesia complications

## 2015-10-28 NOTE — Progress Notes (Signed)
The patient was examined and preop studies reviewed. There has been no change from the prior exam and the patient is ready for surgery.  plan repair of type A dissection with arch reconstruction on D [name]

## 2015-10-28 NOTE — OR Nursing (Signed)
1st call made to SICU , providing a patient update. Plans for transfer to Room 11 postoperatively. SICU aware of IABP. 2nd call made to SICU , providing a patient update. 3rd call made to 2S SICU , confirming plans for transfer to Room 11.

## 2015-10-28 NOTE — Progress Notes (Signed)
Recruitment maneuver performed for 2 minutes PCV 30, PEEP 5, RR 10, I-time 3.0, and FIO2 100 due to sats in mid 80's and ABG results. VT increased to 600, RR to 16, and PEEP to 8 to get sats in mid 90's. Will continue to monitor.

## 2015-10-28 NOTE — Progress Notes (Signed)
EKG CRITICAL VALUE     12 lead EKG performed.  Critical value noted.  Wilhelmina Mcardle, RN notified.   Dannis Deroche C, CCT 10/28/2015 5:07 PM

## 2015-10-28 NOTE — Anesthesia Procedure Notes (Signed)
Procedure Name: Intubation Date/Time: 10/28/2015 7:57 AM Performed by: Charm Barges, Ellianne Gowen R Pre-anesthesia Checklist: Patient identified, Emergency Drugs available, Suction available, Patient being monitored and Timeout performed Patient Re-evaluated:Patient Re-evaluated prior to inductionOxygen Delivery Method: Circle system utilized Preoxygenation: Pre-oxygenation with 100% oxygen Intubation Type: IV induction Ventilation: Mask ventilation without difficulty Laryngoscope Size: Mac and 4 Grade View: Grade II Tube type: Subglottic suction tube Tube size: 8.0 mm Number of attempts: 1 Airway Equipment and Method: Stylet Placement Confirmation: ETT inserted through vocal cords under direct vision,  positive ETCO2 and breath sounds checked- equal and bilateral Secured at: 24 cm Tube secured with: Tape Dental Injury: Teeth and Oropharynx as per pre-operative assessment

## 2015-10-29 ENCOUNTER — Inpatient Hospital Stay (HOSPITAL_COMMUNITY): Payer: Medicaid Other

## 2015-10-29 LAB — GLUCOSE, CAPILLARY
Glucose-Capillary: 100 mg/dL — ABNORMAL HIGH (ref 65–99)
Glucose-Capillary: 104 mg/dL — ABNORMAL HIGH (ref 65–99)
Glucose-Capillary: 106 mg/dL — ABNORMAL HIGH (ref 65–99)
Glucose-Capillary: 108 mg/dL — ABNORMAL HIGH (ref 65–99)
Glucose-Capillary: 111 mg/dL — ABNORMAL HIGH (ref 65–99)
Glucose-Capillary: 114 mg/dL — ABNORMAL HIGH (ref 65–99)
Glucose-Capillary: 114 mg/dL — ABNORMAL HIGH (ref 65–99)
Glucose-Capillary: 116 mg/dL — ABNORMAL HIGH (ref 65–99)
Glucose-Capillary: 117 mg/dL — ABNORMAL HIGH (ref 65–99)
Glucose-Capillary: 117 mg/dL — ABNORMAL HIGH (ref 65–99)
Glucose-Capillary: 117 mg/dL — ABNORMAL HIGH (ref 65–99)
Glucose-Capillary: 117 mg/dL — ABNORMAL HIGH (ref 65–99)
Glucose-Capillary: 119 mg/dL — ABNORMAL HIGH (ref 65–99)
Glucose-Capillary: 120 mg/dL — ABNORMAL HIGH (ref 65–99)
Glucose-Capillary: 120 mg/dL — ABNORMAL HIGH (ref 65–99)
Glucose-Capillary: 123 mg/dL — ABNORMAL HIGH (ref 65–99)
Glucose-Capillary: 132 mg/dL — ABNORMAL HIGH (ref 65–99)
Glucose-Capillary: 132 mg/dL — ABNORMAL HIGH (ref 65–99)
Glucose-Capillary: 134 mg/dL — ABNORMAL HIGH (ref 65–99)
Glucose-Capillary: 135 mg/dL — ABNORMAL HIGH (ref 65–99)
Glucose-Capillary: 91 mg/dL (ref 65–99)
Glucose-Capillary: 98 mg/dL (ref 65–99)

## 2015-10-29 LAB — BASIC METABOLIC PANEL
ANION GAP: 8 (ref 5–15)
BUN: 8 mg/dL (ref 6–20)
CHLORIDE: 107 mmol/L (ref 101–111)
CO2: 25 mmol/L (ref 22–32)
Calcium: 7.6 mg/dL — ABNORMAL LOW (ref 8.9–10.3)
Creatinine, Ser: 0.84 mg/dL (ref 0.61–1.24)
GFR calc Af Amer: >60 mL/min (ref >60)
GLUCOSE: 120 mg/dL — AB (ref 65–99)
POTASSIUM: 4.1 mmol/L (ref 3.5–5.1)
SODIUM: 140 mmol/L (ref 135–145)

## 2015-10-29 LAB — PREPARE CRYOPRECIPITATE
Unit division: 0
Unit division: 0

## 2015-10-29 LAB — POCT I-STAT 3, ART BLOOD GAS (G3+)
Acid-Base Excess: 2 mmol/L (ref 0.0–2.0)
Acid-base deficit: 1 mmol/L (ref 0.0–2.0)
Bicarbonate: 23.9 mEq/L (ref 20.0–24.0)
Bicarbonate: 25.1 mEq/L — ABNORMAL HIGH (ref 20.0–24.0)
Bicarbonate: 23.3 mEq/L (ref 20.0–24.0)
O2 Saturation: 97 %
O2 Saturation: 95 %
O2 Saturation: 94 %
Patient temperature: 36.8
Patient temperature: 37.4
Patient temperature: 37.3
TCO2: 25 mmol/L (ref 0–100)
TCO2: 26 mmol/L (ref 0–100)
TCO2: 24 mmol/L (ref 0–100)
pCO2 arterial: 38 mmHg (ref 35.0–45.0)
pCO2 arterial: 34.2 mmHg — ABNORMAL LOW (ref 35.0–45.0)
pCO2 arterial: 33.4 mmHg — ABNORMAL LOW (ref 35.0–45.0)
pH, Arterial: 7.405 (ref 7.350–7.450)
pH, Arterial: 7.474 — ABNORMAL HIGH (ref 7.350–7.450)
pH, Arterial: 7.452 — ABNORMAL HIGH (ref 7.350–7.450)
pO2, Arterial: 93 mmHg (ref 80.0–100.0)
pO2, Arterial: 72 mmHg — ABNORMAL LOW (ref 80.0–100.0)
pO2, Arterial: 66 mmHg — ABNORMAL LOW (ref 80.0–100.0)

## 2015-10-29 LAB — PROTIME-INR
INR: 1.45 (ref 0.00–1.49)
Prothrombin Time: 17.7 seconds — ABNORMAL HIGH (ref 11.6–15.2)

## 2015-10-29 LAB — MAGNESIUM
MAGNESIUM: 2.7 mg/dL — AB (ref 1.7–2.4)
MAGNESIUM: 2.3 mg/dL (ref 1.7–2.4)

## 2015-10-29 LAB — PREPARE PLATELET PHERESIS
Unit division: 0
Unit division: 0

## 2015-10-29 LAB — POCT I-STAT, CHEM 8
BUN: 8 mg/dL (ref 6–20)
Calcium, Ion: 1.06 mmol/L — ABNORMAL LOW (ref 1.12–1.23)
Chloride: 101 mmol/L (ref 101–111)
Creatinine, Ser: 0.7 mg/dL (ref 0.61–1.24)
Glucose, Bld: 132 mg/dL — ABNORMAL HIGH (ref 65–99)
HCT: 36 % — ABNORMAL LOW (ref 39.0–52.0)
Hemoglobin: 12.2 g/dL — ABNORMAL LOW (ref 13.0–17.0)
Potassium: 3.7 mmol/L (ref 3.5–5.1)
Sodium: 139 mmol/L (ref 135–145)
TCO2: 24 mmol/L (ref 0–100)

## 2015-10-29 LAB — CBC
HCT: 34.1 % — ABNORMAL LOW (ref 39.0–52.0)
HEMATOCRIT: 37.7 % — AB (ref 39.0–52.0)
HEMOGLOBIN: 10.9 g/dL — AB (ref 13.0–17.0)
Hemoglobin: 12.5 g/dL — ABNORMAL LOW (ref 13.0–17.0)
MCH: 26.5 pg (ref 26.0–34.0)
MCH: 25.5 pg — ABNORMAL LOW (ref 26.0–34.0)
MCHC: 33.2 g/dL (ref 30.0–36.0)
MCHC: 32 g/dL (ref 30.0–36.0)
MCV: 80 fL (ref 78.0–100.0)
MCV: 79.7 fL (ref 78.0–100.0)
PLATELETS: 145 10*3/uL — AB (ref 150–400)
PLATELETS: 125 10*3/uL — AB (ref 150–400)
RBC: 4.71 MIL/uL (ref 4.22–5.81)
RBC: 4.28 MIL/uL (ref 4.22–5.81)
RDW: 13.6 % (ref 11.5–15.5)
RDW: 13.6 % (ref 11.5–15.5)
WBC: 13.3 10*3/uL — AB (ref 4.0–10.5)
WBC: 12.9 10*3/uL — AB (ref 4.0–10.5)

## 2015-10-29 LAB — PREPARE FRESH FROZEN PLASMA
Unit division: 0
Unit division: 0

## 2015-10-29 LAB — CARBOXYHEMOGLOBIN
Carboxyhemoglobin: 1 % (ref 0.5–1.5)
Methemoglobin: 0.9 % (ref 0.0–1.5)
O2 Saturation: 70.3 %
Total hemoglobin: 12.6 g/dL — ABNORMAL LOW (ref 13.5–18.0)

## 2015-10-29 LAB — CREATININE, SERUM: CREATININE: 0.88 mg/dL (ref 0.61–1.24)

## 2015-10-29 LAB — APTT: aPTT: 35 seconds (ref 24–37)

## 2015-10-29 MED ORDER — SODIUM CHLORIDE 0.9% FLUSH: 10.0000 mL | INTRAVENOUS | Status: DC | PRN

## 2015-10-29 MED ORDER — RACEPINEPHRINE HCL 2.25 % IN NEBU
INHALATION_SOLUTION | RESPIRATORY_TRACT | Status: AC
  Filled 2015-10-29: qty 0.5

## 2015-10-29 MED ORDER — CHLORHEXIDINE GLUCONATE 0.12% ORAL RINSE (MEDLINE KIT)
15.0000 mL | Freq: Two times a day (BID) | OROMUCOSAL | Status: DC
  Administered 2015-10-29 – 2015-11-01 (×7): 15 mL via OROMUCOSAL

## 2015-10-29 MED ORDER — FUROSEMIDE 10 MG/ML IJ SOLN
20.0000 mg | Freq: Two times a day (BID) | INTRAMUSCULAR | Status: DC
  Administered 2015-10-29: 20 mg via INTRAVENOUS
  Filled 2015-10-29: qty 2

## 2015-10-29 MED ORDER — RACEPINEPHRINE HCL 2.25 % IN NEBU: 0.5000 mL | INHALATION_SOLUTION | Freq: Once | RESPIRATORY_TRACT | Status: DC

## 2015-10-29 MED ORDER — ANTISEPTIC ORAL RINSE SOLUTION (CORINZ)
7.0000 mL | Freq: Four times a day (QID) | OROMUCOSAL | Status: DC
  Administered 2015-10-29 – 2015-11-01 (×12): 7 mL via OROMUCOSAL

## 2015-10-29 MED ORDER — METOPROLOL TARTRATE 12.5 MG HALF TABLET
12.5000 mg | ORAL_TABLET | Freq: Two times a day (BID) | ORAL | Status: DC
  Administered 2015-10-30 – 2015-10-31 (×2): 12.5 mg via ORAL
  Filled 2015-10-29 (×2): qty 1

## 2015-10-29 MED ORDER — METOPROLOL TARTRATE 25 MG/10 ML ORAL SUSPENSION: 12.5000 mg | Freq: Two times a day (BID) | ORAL | Status: DC

## 2015-10-29 MED ORDER — FUROSEMIDE 10 MG/ML IJ SOLN
40.0000 mg | Freq: Two times a day (BID) | INTRAMUSCULAR | Status: DC
  Administered 2015-10-29 – 2015-11-03 (×10): 40 mg via INTRAVENOUS
  Filled 2015-10-29 (×10): qty 4

## 2015-10-29 MED ORDER — SODIUM CHLORIDE 0.9% FLUSH
10.0000 mL | Freq: Two times a day (BID) | INTRAVENOUS | Status: DC
  Administered 2015-10-29: 10 mL
  Administered 2015-10-29: 20 mL

## 2015-10-29 MED ORDER — POTASSIUM CHLORIDE 10 MEQ/50ML IV SOLN
10.0000 meq | INTRAVENOUS | Status: AC
  Administered 2015-10-29 (×3): 10 meq via INTRAVENOUS

## 2015-10-29 MED FILL — Heparin Sodium (Porcine) Inj 1000 Unit/ML: INTRAMUSCULAR | Qty: 10 | Status: AC

## 2015-10-29 MED FILL — Mannitol IV Soln 20%: INTRAVENOUS | Qty: 500 | Status: AC

## 2015-10-29 MED FILL — Electrolyte-R (PH 7.4) Solution: INTRAVENOUS | Qty: 5000 | Status: AC

## 2015-10-29 MED FILL — Sodium Bicarbonate IV Soln 8.4%: INTRAVENOUS | Qty: 300 | Status: AC

## 2015-10-29 MED FILL — Sodium Chloride IV Soln 0.9%: INTRAVENOUS | Qty: 4000 | Status: AC

## 2015-10-29 MED FILL — Calcium Chloride Inj 10%: INTRAVENOUS | Qty: 10 | Status: AC

## 2015-10-29 MED FILL — Lidocaine HCl IV Inj 20 MG/ML: INTRAVENOUS | Qty: 10 | Status: AC

## 2015-10-29 NOTE — Anesthesia Postprocedure Evaluation (Signed)
Anesthesia Post Note  Patient: [name]  Procedure(s) Performed: Procedure(s) (LRB): TYPE A AORTIC DISSECTION REPAIR WITH CIRC ARREST (N/A) TRANSESOPHAGEAL ECHOCARDIOGRAM (TEE) (N/A)  Patient location during evaluation: SICU Anesthesia Type: General Level of consciousness: sedated, responds to stimulation and patient remains intubated per anesthesia plan Pain management: pain level controlled Vital Signs Assessment: post-procedure vital signs reviewed and stable Respiratory status: respiratory function stable and patient remains intubated per anesthesia plan Cardiovascular status: stable (IABP removed today, remains on epi, milrinone, and dopamine infusions) Anesthetic complications: no    Last Vitals:  Filed Vitals:   10/29/15 1545 10/29/15 1600  BP:  121/64  Pulse: 87 88  Temp: 37.5 C 37.5 C  Resp: 19 20    Last Pain:  Filed Vitals:   10/29/15 1607  PainSc: Asleep                 Shakerra Red,E. Gianne Shugars

## 2015-10-29 NOTE — Progress Notes (Signed)
1 Day Post-Op Procedure(s) (LRB): TYPE A AORTIC DISSECTION REPAIR WITH CIRC ARREST (N/A) TRANSESOPHAGEAL ECHOCARDIOGRAM (TEE) (N/A) Subjective: Status post repair of type A dissection with 28 mm hema- shield hemi-arch ascending aortic graft Placement of intra-aortic balloon pump for LV dysfunction Patient had excellent night with optimal hemodynamics Patient will wake up and respond and move all extremities on ventilator FiO2 has been weaned from 100% to 50% and chest x-ray is clearing Patient was weaned on balloon pump today and balloon pump has been removed with good right foot Doppler pulse and stable hemodynamics Attempt at weaning ventilator by reducing sedation resulted in agitation-delirium and ventilator weaning will be held until tomorrow Adequate urine output and laboratory values  Objective: Vital signs in last 24 hours: Temp:  [95 F (35 C)-99.5 F (37.5 C)] 99.3 F (37.4 C) (02/17 1630) Pulse Rate:  [44-183] 90 (02/17 1630) Cardiac Rhythm:  [-] Normal sinus rhythm (02/17 1400) Resp:  [13-26] 18 (02/17 1630) BP: (91-155)/(51-106) 155/60 mmHg (02/17 1630) SpO2:  [94 %-99 %] 96 % (02/17 1645) Arterial Line BP: (82-156)/(48-83) 144/56 mmHg (02/17 1600) FiO2 (%):  [50 %-90 %] 50 % (02/17 1645) Weight:  [266 lb 15.6 oz (121.1 kg)] 266 lb 15.6 oz (121.1 kg) (02/17 0500)  Hemodynamic parameters for last 24 hours: PAP: (20-41)/(14-28) 23/15 mmHg CO:  [3.8 L/min-7.2 L/min] 5.7 L/min CI:  [1.7 L/min/m2-3.3 L/min/m2] 2.6 L/min/m2  Intake/Output from previous day: 02/16 0701 - 02/17 0700 In: 9005.2 [I.V.:4826.2; Blood:2849; NG/GT:30; IV Piggyback:1300] Out: 4985 [Urine:2820; Blood:1805; Chest Tube:360] Intake/Output this shift: Total I/O In: 1367 [I.V.:937; Other:30; NG/GT:100; IV Piggyback:300] Out: 845 [Urine:705; Chest Tube:140]  Sedated on ventilator No murmur Breath sounds are scattered rhonchi Abdomen soft Doppler pulses in all extremities  Lab Results:  Recent  Labs  10/28/15 1650  10/29/15 0340 10/29/15 1639  WBC 18.4*  --  13.3*  --   HGB 13.6  < > 12.5* 12.2*  HCT 41.7  < > 37.7* 36.0*  PLT 159  --  145*  --   < > = values in this interval not displayed. BMET:  Recent Labs  10/28/15 2220 10/29/15 0340 10/29/15 1639  NA 139 140 139  K 4.5 4.1 3.7  CL 107 107 101  CO2 24 25  --   GLUCOSE 148* 120* 132*  BUN CREATININE 0.88 0.84 0.70  CALCIUM 7.4* 7.6*  --     PT/INR:  Recent Labs  10/29/15 0840  LABPROT 17.7*  INR 1.45   ABG    Component Value Date/Time   PHART 7.452* 10/29/2015 1630   HCO3 23.3 10/29/2015 1630   TCO2 24 10/29/2015 1639   ACIDBASEDEF 1.0 10/29/2015 0345   O2SAT 94.0 10/29/2015 1630   CBG (last 3)   Recent Labs  10/29/15 1235 10/29/15 1332 10/29/15 1435  GLUCAP 116* 119* 114*    Assessment/Plan: S/P Procedure(s) (LRB): TYPE A AORTIC DISSECTION REPAIR WITH CIRC ARREST (N/A) TRANSESOPHAGEAL ECHOCARDIOGRAM (TEE) (N/A) Diuresis Diabetes control We'll leave the patient on ventilator under sedation until a.m.   LOS: 4 days    Kathlee Nations Trigt III 10/29/2015

## 2015-10-29 NOTE — Progress Notes (Addendum)
Initial Nutrition Assessment  DOCUMENTATION CODES:   Morbid obesity  INTERVENTION:    If TF started, recommend Vital High Protein formula -- initiate at 20 ml/hr and increase by 10 ml every 4 hours to goal rate of 50 ml/hr   Prostat liquid protein 30 ml QID via tube  Above TF regimen to provide 1600 kcals, 165 gm protein, 1003 ml of free water  NUTRITION DIAGNOSIS:   Inadequate oral intake related to inability to eat as evidenced by NPO status  GOAL:   Provide needs based on ASPEN/SCCM guidelines  MONITOR:   Vent status, Labs, Weight trends, Skin, I & O's  REASON FOR ASSESSMENT:   Ventilator  ASSESSMENT:   55 yo Male with acute right occipital stroke with left hemianopsia related to aortic dissection involving the distal ascending aorta and aortic arch. Patient was recommended urgent repair with replacement of the ascending aorta and reconstruction of arch vessels.  Patient s/p procedures 2/16: TYPE A AORTIC DISSECTION REPAIR WITH 28 HEMASHIELD AORTO -AORTIC ANASTAMOSIS , CIRC ARREST  Patient is currently intubated on ventilator support -- OGT in place Temp (24hrs), Avg:97.6 F (36.4 C), Min:95 F (35 C), Max:99.3 F (37.4 C)   Nutrition focused physical exam completed.  No muscle or subcutaneous fat depletion noticed.  Diet Order:  Diet NPO time specified  Skin:  Reviewed, no issues  Last BM:  2/12  Height:   Ht Readings from Last 1 Encounters:  10/25/15  (1.702 m)    Weight:   Wt Readings from Last 1 Encounters:  10/29/15 266 lb 15.6 oz (121.1 kg)    Ideal Body Weight:  67.2 kg  BMI:  Body mass index is 41.8 kg/(m^2).  Estimated Nutritional Needs:   Kcal:  0454-0981  Protein:  160-170 gm  Fluid:  per MD  EDUCATION NEEDS:   No education needs identified at this time  Maureen Chatters, RD, LDN Pager #: [phone] After-Hours Pager #: [phone]

## 2015-10-29 NOTE — Care Management Note (Signed)
Case Management Note  Patient Details  Name: [name] MRN: [id number] Date of Birth: Nov 14, 1960  Subjective/Objective:     Recommend surgical replacement of his ascending aorta and reconstruction of arch vessels               Action/Plan:  10/29/2015  Pt is s/p aortic dissection repair that suffered MI on OR table, pt remains on ventilator with IV sedation, vasopressors, insulin, milrinone, amiodarone.  10/26/15  Pt is from home with mom, pt completely independent prior to admit.  Pt will need assistance with PCP and medications prior discharge   Expected Discharge Date:                  Expected Discharge Plan:  Home/Self Care  In-House Referral:     Discharge planning Services  CM Consult  Post Acute Care Choice:    Choice offered to:     DME Arranged:    DME Agency:     HH Arranged:    HH Agency:     Status of Service:  In process, will continue to follow  Medicare Important Message Given:    Date Medicare IM Given:    Medicare IM give by:    Date Additional Medicare IM Given:    Additional Medicare Important Message give by:     If discussed at Long Length of Stay Meetings, dates discussed:    Additional Comments:  Cherylann Parr, RN 10/29/2015, 2:41 PM

## 2015-10-30 ENCOUNTER — Inpatient Hospital Stay (HOSPITAL_COMMUNITY): Payer: Medicaid Other

## 2015-10-30 LAB — POCT I-STAT 3, ART BLOOD GAS (G3+)
Acid-Base Excess: 1 mmol/L (ref 0.0–2.0)
Acid-base deficit: 2 mmol/L (ref 0.0–2.0)
Acid-base deficit: 1 mmol/L (ref 0.0–2.0)
Acid-base deficit: 2 mmol/L (ref 0.0–2.0)
Bicarbonate: 24.4 mEq/L — ABNORMAL HIGH (ref 20.0–24.0)
Bicarbonate: 21.6 mEq/L (ref 20.0–24.0)
Bicarbonate: 22.6 mEq/L (ref 20.0–24.0)
Bicarbonate: 21.1 mEq/L (ref 20.0–24.0)
Bicarbonate: 21.7 mEq/L (ref 20.0–24.0)
O2 Saturation: 98 %
O2 Saturation: 93 %
O2 Saturation: 94 %
O2 Saturation: 91 %
O2 Saturation: 95 %
Patient temperature: 37.7
Patient temperature: 37.4
Patient temperature: 37.5
Patient temperature: 98.4
Patient temperature: 98.9
TCO2: 25 mmol/L (ref 0–100)
TCO2: 23 mmol/L (ref 0–100)
TCO2: 24 mmol/L (ref 0–100)
TCO2: 22 mmol/L (ref 0–100)
TCO2: 23 mmol/L (ref 0–100)
pCO2 arterial: 36.7 mmHg (ref 35.0–45.0)
pCO2 arterial: 30.5 mmHg — ABNORMAL LOW (ref 35.0–45.0)
pCO2 arterial: 31.7 mmHg — ABNORMAL LOW (ref 35.0–45.0)
pCO2 arterial: 27.4 mmHg — ABNORMAL LOW (ref 35.0–45.0)
pCO2 arterial: 30.7 mmHg — ABNORMAL LOW (ref 35.0–45.0)
pH, Arterial: 7.433 (ref 7.350–7.450)
pH, Arterial: 7.443 (ref 7.350–7.450)
pH, Arterial: 7.481 — ABNORMAL HIGH (ref 7.350–7.450)
pH, Arterial: 7.492 — ABNORMAL HIGH (ref 7.350–7.450)
pH, Arterial: 7.457 — ABNORMAL HIGH (ref 7.350–7.450)
pO2, Arterial: 97 mmHg (ref 80.0–100.0)
pO2, Arterial: 64 mmHg — ABNORMAL LOW (ref 80.0–100.0)
pO2, Arterial: 66 mmHg — ABNORMAL LOW (ref 80.0–100.0)
pO2, Arterial: 53 mmHg — ABNORMAL LOW (ref 80.0–100.0)
pO2, Arterial: 72 mmHg — ABNORMAL LOW (ref 80.0–100.0)

## 2015-10-30 LAB — COMPREHENSIVE METABOLIC PANEL
ALT: 54 U/L (ref 17–63)
AST: 237 U/L — ABNORMAL HIGH (ref 15–41)
Albumin: 2 g/dL — ABNORMAL LOW (ref 3.5–5.0)
Alkaline Phosphatase: 48 U/L (ref 38–126)
Anion gap: 7 (ref 5–15)
BUN: 8 mg/dL (ref 6–20)
CO2: 24 mmol/L (ref 22–32)
Calcium: 7.6 mg/dL — ABNORMAL LOW (ref 8.9–10.3)
Chloride: 108 mmol/L (ref 101–111)
Creatinine, Ser: 0.86 mg/dL (ref 0.61–1.24)
GFR calc Af Amer: >60 mL/min (ref >60)
GFR calc non Af Amer: >60 mL/min (ref >60)
Glucose, Bld: 104 mg/dL — ABNORMAL HIGH (ref 65–99)
Potassium: 4 mmol/L (ref 3.5–5.1)
Sodium: 139 mmol/L (ref 135–145)
Total Bilirubin: 0.1 mg/dL — ABNORMAL LOW (ref 0.3–1.2)
Total Protein: 4.7 g/dL — ABNORMAL LOW (ref 6.5–8.1)

## 2015-10-30 LAB — POCT I-STAT, CHEM 8
BUN: 11 mg/dL (ref 6–20)
Calcium, Ion: 1.04 mmol/L — ABNORMAL LOW (ref 1.12–1.23)
Chloride: 102 mmol/L (ref 101–111)
Creatinine, Ser: 0.8 mg/dL (ref 0.61–1.24)
Glucose, Bld: 121 mg/dL — ABNORMAL HIGH (ref 65–99)
HCT: 31 % — ABNORMAL LOW (ref 39.0–52.0)
Hemoglobin: 10.5 g/dL — ABNORMAL LOW (ref 13.0–17.0)
Potassium: 3.7 mmol/L (ref 3.5–5.1)
Sodium: 138 mmol/L (ref 135–145)
TCO2: 22 mmol/L (ref 0–100)

## 2015-10-30 LAB — GLUCOSE, CAPILLARY
Glucose-Capillary: 102 mg/dL — ABNORMAL HIGH (ref 65–99)
Glucose-Capillary: 99 mg/dL (ref 65–99)
Glucose-Capillary: 103 mg/dL — ABNORMAL HIGH (ref 65–99)
Glucose-Capillary: 87 mg/dL (ref 65–99)
Glucose-Capillary: 91 mg/dL (ref 65–99)
Glucose-Capillary: 106 mg/dL — ABNORMAL HIGH (ref 65–99)
Glucose-Capillary: 108 mg/dL — ABNORMAL HIGH (ref 65–99)
Glucose-Capillary: 89 mg/dL (ref 65–99)
Glucose-Capillary: 119 mg/dL — ABNORMAL HIGH (ref 65–99)

## 2015-10-30 LAB — CBC
HCT: 32.4 % — ABNORMAL LOW (ref 39.0–52.0)
Hemoglobin: 10.2 g/dL — ABNORMAL LOW (ref 13.0–17.0)
MCH: 25.1 pg — ABNORMAL LOW (ref 26.0–34.0)
MCHC: 31.5 g/dL (ref 30.0–36.0)
MCV: 79.8 fL (ref 78.0–100.0)
Platelets: 122 10*3/uL — ABNORMAL LOW (ref 150–400)
RBC: 4.06 MIL/uL — ABNORMAL LOW (ref 4.22–5.81)
RDW: 13.6 % (ref 11.5–15.5)
WBC: 12.8 10*3/uL — ABNORMAL HIGH (ref 4.0–10.5)

## 2015-10-30 LAB — CARBOXYHEMOGLOBIN
Carboxyhemoglobin: 0.6 % (ref 0.5–1.5)
Methemoglobin: 1.1 % (ref 0.0–1.5)
O2 Saturation: 62.2 %
Total hemoglobin: 10.1 g/dL — ABNORMAL LOW (ref 13.5–18.0)

## 2015-10-30 MED ORDER — INSULIN ASPART 100 UNIT/ML ~~LOC~~ SOLN
0.0000 [IU] | SUBCUTANEOUS | Status: DC
  Administered 2015-10-31: 2 [IU] via SUBCUTANEOUS

## 2015-10-30 MED ORDER — METOCLOPRAMIDE HCL 5 MG/ML IJ SOLN
10.0000 mg | Freq: Four times a day (QID) | INTRAMUSCULAR | Status: DC
  Administered 2015-10-30 (×2): 10 mg via INTRAVENOUS
  Filled 2015-10-30 (×2): qty 2

## 2015-10-30 MED ORDER — CLONIDINE HCL 0.2 MG/24HR TD PTWK
0.2000 mg | MEDICATED_PATCH | TRANSDERMAL | Status: DC
  Administered 2015-10-30: 0.2 mg via TRANSDERMAL
  Filled 2015-10-30: qty 1

## 2015-10-30 MED ORDER — INSULIN ASPART 100 UNIT/ML ~~LOC~~ SOLN: 0.0000 [IU] | SUBCUTANEOUS | Status: DC

## 2015-10-30 MED ORDER — DEXMEDETOMIDINE HCL IN NACL 200 MCG/50ML IV SOLN
0.4000 ug/kg/h | INTRAVENOUS | Status: DC
  Administered 2015-10-30: 1 ug/kg/h via INTRAVENOUS
  Filled 2015-10-30: qty 50

## 2015-10-30 MED ORDER — BISACODYL 10 MG RE SUPP
10.0000 mg | Freq: Once | RECTAL | Status: AC
  Administered 2015-10-30: 10 mg via RECTAL
  Filled 2015-10-30: qty 1

## 2015-10-30 MED ORDER — HALOPERIDOL LACTATE 5 MG/ML IJ SOLN
4.0000 mg | Freq: Once | INTRAMUSCULAR | Status: AC
  Administered 2015-10-30: 4 mg via INTRAVENOUS
  Filled 2015-10-30: qty 1

## 2015-10-30 MED ORDER — THIAMINE HCL 100 MG/ML IJ SOLN
100.0000 mg | Freq: Every day | INTRAMUSCULAR | Status: DC
  Administered 2015-10-31 – 2015-11-04 (×5): 100 mg via INTRAVENOUS
  Filled 2015-10-30 (×5): qty 2

## 2015-10-30 MED ORDER — FENTANYL CITRATE (PF) 100 MCG/2ML IJ SOLN
50.0000 ug | INTRAMUSCULAR | Status: DC | PRN
  Administered 2015-10-31 – 2015-11-01 (×3): 50 ug via INTRAVENOUS
  Filled 2015-10-30 (×3): qty 2

## 2015-10-30 MED ORDER — OXYCODONE HCL 5 MG PO TABS
5.0000 mg | ORAL_TABLET | ORAL | Status: DC | PRN
  Administered 2015-10-30 – 2015-11-05 (×24): 5 mg via ORAL
  Filled 2015-10-30 (×25): qty 1

## 2015-10-30 MED ORDER — DM-GUAIFENESIN ER 30-600 MG PO TB12
1.0000 | ORAL_TABLET | Freq: Two times a day (BID) | ORAL | Status: DC
  Administered 2015-10-30 – 2015-11-05 (×12): 1 via ORAL
  Filled 2015-10-30 (×12): qty 1

## 2015-10-30 MED ORDER — POTASSIUM CHLORIDE 10 MEQ/50ML IV SOLN
10.0000 meq | INTRAVENOUS | Status: AC
  Administered 2015-10-30 (×3): 10 meq via INTRAVENOUS
  Filled 2015-10-30 (×3): qty 50

## 2015-10-30 MED ORDER — INSULIN DETEMIR 100 UNIT/ML ~~LOC~~ SOLN
10.0000 [IU] | Freq: Two times a day (BID) | SUBCUTANEOUS | Status: DC
  Administered 2015-10-30 – 2015-11-02 (×8): 10 [IU] via SUBCUTANEOUS
  Filled 2015-10-30 (×11): qty 0.1

## 2015-10-30 NOTE — Progress Notes (Signed)
10/30/2015 1530 Nursing note Dr. Donata Clay on floor and made aware of ABG results from 1500. Orders received to place pt. On 70% FIO2 and recheck ABG in one hour. RT updated. Orders enacted. Will continue to closely monitor patient.  Matalynn Graff, Blanchard Kelch

## 2015-10-30 NOTE — Progress Notes (Addendum)
10/30/2015 0940 Verbal order Dr. Donata Clay ok to d/c MT while pt. Still in bed and intubated and sedated without dangle. Verbal order confirmed to only remove MT and not PT. Orders enacted. Will continue to monitor patient.  Ansen Sayegh, Blanchard Kelch

## 2015-10-30 NOTE — Progress Notes (Signed)
UR Completed. Deronte Solis, RN, BSN.  [phone] 

## 2015-10-30 NOTE — Progress Notes (Signed)
Initiated Open Heart Rapid Wean per protocol. RN at bedside. RT will continue to monitor.  

## 2015-10-30 NOTE — Progress Notes (Signed)
10/30/2015 1200 Nursing note  Verified with Dr. Donata Clay do not wean dopamine or milrinone despite d/c order in Epic. Orders enacted. Will continue to closely monitor patient.  Kemora Pinard, Blanchard Kelch

## 2015-10-30 NOTE — Op Note (Signed)
NAMEROSHAN, ROBACK NO.:  [id number]  MEDICAL RECORD NO.:  [id number]  LOCATION:  [facility]                       FACILITY:  [facility]  PHYSICIAN:  Kerin Perna, M.D.  DATE OF BIRTH:  12-27-1960  DATE OF PROCEDURE:  10/28/2015 DATE OF DISCHARGE:  10/28/2015                              OPERATIVE REPORT   OPERATION: 1. Repair of type A ascending aortic dissection with replacement of     the ascending aorta and hemi arch reconstruction using a 28-mm     Hemashield graft. 2. Hypothermic circulatory arrest with retrograde cerebral perfusion. 3. Placement of intra-aortic balloon pump.  SURGEON:  Kerin Perna, M.D.  ASSISTANT:  Rowe Clack, P.A.-C.  ANESTHESIA:  General by Dr. Jairo Ben.  PREOPERATIVE DIAGNOSIS:  Acute type A thoracic aortic dissection with associated right occipital cerebrovascular accident.  POSTOPERATIVE DIAGNOSIS:  Acute type A thoracic aortic dissection with associated right occipital cerebrovascular accident.  INDICATIONS:  The patient is a 55 year old, African-American hypertensive male with a history of previous type B descending thoracic aortic dissection 6 months ago.  The patient smokes tobacco and cocaine. He is noncompliant with his antihypertensive medications.  He was admitted to the hospital the day after using cocaine with visual symptoms and loss of vision to the left side.  He had no associated chest pain.  Head CT scan and neck CT scan were performed showing a right occipital CVA.  There was some reduced flow in the right carotid noted on the CTA of the neck.  Subsequently, a chest CTA was performed to assess his thoracic aorta and his old type B dissection was noted with the false lumen mainly thrombosed, however, there was a new type A dissection with a tear starting at the ascending aorta above the sinotubular junction.  I saw the patient in consultation in the emergency department after the results of the  CT scan of the chest were obtained.  I discussed with the patient the results of the CT scan and the diagnosis of a type A dissection.  I recommended treatment for acute type A dissection and surgical repair.  After discussing the details of the surgery and indications, benefits, and risks in detail, the patient elected not to undergo surgery at that time.  He was admitted to the hospital for close observation and blood pressure control.  An echocardiogram showed normal LV function, LVH with some diastolic dysfunction and 1+ AI, 1+ MR, and no pericardial effusion.  After 48 hours, the patient was counseled with his family and changed his mind and agreed to proceed with the recommended surgical repair of type A dissection.  Procedure was discussed in detail again with the patient including the use of general anesthesia and cardiopulmonary bypass, the location of the surgical incisions, the expected postop recovery, and the potential risks and complications including risks of worsening stroke, bleeding, blood transfusion requirement, postoperative lung problems including pleural effusion, pneumothorax, postoperative infection, postoperative multi- system failure and death.  After reviewing these issues, he demonstrated his understanding and was clearly affirmative in his consent to proceed with surgery.  OPERATIVE FINDINGS: 1. The patient had weak right upper extremity pulses and poor flow  through the right subclavian artery on CTA, so cannulation of the     arterial system was not performed in the right axillary artery but     was performed in the left femoral artery with a cutdown. 2. Type A dissection with a large false lumen filled with thrombus     extending down to the sinotubular junction without involvement of     the coronaries and without loss of support of the aortic valve.     Distally, the false lumen extended into the brachiocephalic artery     sub-adventitial space,  however, there were no intimal abnormalities     of the aortic arch or the innominate artery. 3. Retrograde cerebral perfusion was performed during circulatory     arrest with a catheter placed in the IVC with flows of 150-175     mL/minute monitored by cerebral oximeter. 4. After separation from cardiopulmonary bypass, the patent very thick     LV had suboptimal systolic function and a balloon pump was placed     as the patient's coronary status was not known preoperatively.  OPERATIVE PROCEDURE:  The patient was brought to the operating room and placed supine on the operating table where general anesthesia was induced under invasive hemodynamic monitoring.  A transesophageal echo probe was placed by the anesthesiologist which documented the diagnosis of type A dissection without significant AI and with good LV systolic function, although with a very thick LV with diastolic dysfunction.  A proper time-out was performed.  The patient's chest, abdomen, and legs were prepped with Betadine and draped as a sterile field.  A femoral A-line was placed in the right femoral artery.  An incision was made obliquely in the left groin to expose the left common femoral artery.  This was dissected and surrounded with vessel loops and 4000 units of heparin was administered.  An 18-French femoral artery cannula was placed over the guidewires after being dilated and placed into the true lumen of the left femoral artery and connected to the cardiopulmonary bypass circuit.  A sternal incision was made and the pericardium was opened.  There was bloody pericardial fluid which immediately drained.  The pericardium was suspended.  The aorta and heart were examined.  The aorta had a hematoma extending from the sinotubular junction up to the arch.  There was no active bleeding.  Pursestrings were placed in the right atrium for venous cannulation as well as a retrograde coronary sinus cardioplegia catheter.   Heparin was administered and the ACT was documented as being therapeutic.  The patient was then placed on cardiopulmonary bypass with venous return from the right atrium and arterial inflow into the left femoral artery.  The patient ultimately was cooled to 18 degrees.  A left ventricular vent was placed via the right superior pulmonary vein.  Caval tapes were placed around the SVC and a retrograde cerebral perfusion catheter was placed through pursestrings in the SVC.  The innominate artery was dissected out and encircled with a vessel loop.  The innominate vein was dissected and elevated gently with vessel loops.  A retrograde coronary sinus cardioplegia catheter was placed. The pulmonary artery was gently dissected off the aorta to which it was adherent.  The aortic crossclamp was then placed beneath the innominate artery for cardioplegic arrest.  Cardioplegia was delivered using the retrograde coronary sinus catheter, 1.2 L.  The aorta was divided beneath the crossclamp and immediately the false lumen was entered.  The true lumen was then  entered and the aorta was divided carefully with care taken to avoid injury to the pulmonary artery beneath the aorta.  The aortic root was then inspected.  The false lumen extended down to the commissures but did not affect the aortic valve in the coronary sinuses.  The aorta was transected proximally just above the sino- tubular junction.  The thrombus in the false lumen was cleaned out, and the false lumen was irrigated.  The aortic diameter at the sinotubular junction was sized with a valve sized to be 29 mm.  A 28-French Hemashield graft was selected.  The false lumen was reconstructed using a strip of Teflon felt and biologic glue, compressed the intima to the adventitial layer where the Teflon felt in between using a 30 mL Foley catheter.  After the catheter was removed, the straight graft was sewn and then using a running 3-0 Prolene  for the posterior layer with interrupted pledgeted 4-0 RV sutures to provide hemostasis on the inside of the anastomosis.  The running 3-0 Prolene was then continued around the outside anterior aspect of the anastomosis and tied.  This was then reinforced with several interrupted 4-0 Prolene pledgeted sutures along the anterior aspect.  Some CoSeal was then placed around the anastomosis for needle hole hemostasis.  At this point, the patient has been cooled down to 18 degrees.  We prepared for hypothermic circulatory arrest with retrograde cerebral perfusion.  The head was packed in ice and the anesthesia team gave steroids and etomidate.  The patient's blood volume was drained to the pump and he was placed in steep Trendelenburg.  The operative field was insufflated with CO2.  The crossclamp was removed.  The aorta was then divided just beneath the innominate artery beveled underneath the arch.  The intima of the arch, the arch vessels were carefully examined and there was no intimal tear.  The aorta did have a false lumen and the thrombus was carefully removed.  Again, Teflon felt was used to obliterate the space between the intima and the adventitia and the Teflon felt was cut to the appropriate depth and length and placed in between the 2 layers of the aorta, and then biologic glue was placed and the layers were sealed together using a 30 mL Foley balloon inflated for 1 minute.  The patient was started on cerebral retrograde perfusion off the catheter in the IVC and there was good retrograde perfusion of the arch vessels noted.  Next, the straight graft was trimmed to the appropriate length and beveled and sewn to the proximal arch using running 3-0 Prolene on the lower aspect of the anastomosis, which was then reinforced with interrupted 4-0 Prolene sutures.  The anterior aspect of the anastomosis was completed using a running 3-0 Prolene and then several interrupted 4- 0  pledgeted sutures were placed around the anterior aspect of the anastomosis.  A new needle was then placed in the graft to vent air and the patient had flow resumed from the femoral artery cannula and the air was vented from the McGee needle in the graft.  The patient was then slowly reperfused up to normal flow and rewarmed. This rewarming process took approximately 1.5 hours.  The suture lines appeared to be intact with pressurization of the entire thoracic aorta.  Temporary pacing wires were applied.  The retrograde cerebral perfusion catheter was removed from the SVC.  After almost 2 hours of rewarming and reperfusion, low-dose inotropes were started.  The lungs were expanded and ventilator  was resumed.  It should be noted that the patient's lungs had very dark gray discoloration consistent with cocaine use.  The patient was then separated from cardiopulmonary bypass.  Under echo guidance, the LV function appeared to be adequate.  There was some air in the left side of the heart and the patient's head was kept down and the vent was kept to suction on the ascending aortic graft.  The patient was then completely separated from cardiopulmonary bypass with adequate blood pressure.  Echo showed 2+ MR and 1-2+ AI with some LV dysfunction. It was elected to go back on bypass and arrest the heart and scavenge anymore residual air.  The patient was then placed on full bypass support.  The patient's EKG did show some ST-segment changes in the inferior leads which could have been secondary to air or some coronary disease.  The patient had not undergone cardiac catheterization because of his aortic root dissection.  I elected to place a balloon pump in the right femoral artery through the previously placed A line.  A 40-French balloon was percutaneously placed in the standard manner and was documented to be in the true lumen by echo.  This was then placed in one-to-one augmentation.  We  then placed the patient back on inotropes and with the ventilator activated, the patient was weaned from cardiopulmonary bypass and balloon pump and at this point, LV function was excellent and hemodynamics were stable.  Protamine was administered without adverse reaction.  The AI was 1+ and the MR at this time was trace.  The patient remained stable after receiving protamine.  The venous cannula was removed.  The cardioplegia cannula was removed.  The patient remained stable.  After the protamine had been completely dosed, the femoral cannula was removed and the pursestring was tied in the left common femoral artery.  We then spent considerable time drying up the operative field.  The patient was given platelets and FFP.  This improved coagulation function.  The platelet count was low, and the patient had received 2-3 units of blood during the pump run.  After hemostasis was adequately obtained, anterior and posterior mediastinal tubes and a left pleural tube were placed and brought out through separate incisions.  The pericardium was closed over the aorta. The sternum was closed with interrupted steel wire.  The groin incision was closed with interrupted #1 Vicryl for the deep layer and a running 2- 0 subcutaneous closure and a 3-0 subcuticular skin closure.  The chest was then closed with a running #1 Vicryl for the fascial layer and 2-0 Vicryl for the subcutaneous layer.  The subcuticular skin suture was used to close the skin.  Sterile dressings were applied, and the chest tubes were connected to a Pleur-evac drainage system.  Total cardiopulmonary bypass time was 227 minutes with a retrograde cerebral perfusion circulatory arrest time of 55 minutes.     Kerin Perna, M.D.     PV/MEDQ  D:  10/29/2015  T:  10/30/2015  Job:  [id number]

## 2015-10-30 NOTE — Procedures (Signed)
Extubation Procedure Note  Patient Details:   Name: [name] DOB: [birth year] MRN: [id number]   Airway Documentation:     Evaluation  O2 sats: stable throughout Complications: No apparent complications Patient did tolerate procedure well. Bilateral Breath Sounds: Clear, Diminished Suctioning: Oral, Airway Yes   Pt extubated to 3L [place] per Open Heart Rapid Wean Protocol. Pt NIF -60, VC 1.4L, ABG within normal limits. Pt stable throughout with no complications. Pt able to speak name and has a strong productive cough post extubation. Pt with diminished BS bilaterally. RN at bedside. RT will continue to monitor.   Carolan Shiver 10/30/2015, 10:46 AM

## 2015-10-30 NOTE — Progress Notes (Signed)
10/30/2015 1700 Confirmed with Dr. Donata Clay ok to use cuff pressures to wean NTG gtt. Verbal orders ok to d/c a line once evening labs obtained. Orders enacted. Oncoming RN updated on plan.  Rahcel Shutes, Blanchard Kelch

## 2015-10-30 NOTE — Progress Notes (Signed)
2 Days Post-Op Procedure(s) (LRB): TYPE A AORTIC DISSECTION REPAIR WITH CIRC ARREST (N/A) TRANSESOPHAGEAL ECHOCARDIOGRAM (TEE) (N/A) Subjective: Patient extubated today and plan to mobilize out of bed to chair Patient range hypertensive on IV nitroglycerin Fluid overload on IV Lasix with adequate urine output Postoperative ileus with cecal dilatation, clear liquids only Continue milrinone for diastolic dysfunction, LVH  Objective: Vital signs in last 24 hours: Temp:  [98.4 F (36.9 C)-100.6 F (38.1 C)] 98.9 F (37.2 C) (02/18 1552) Pulse Rate:  [80-104] 102 (02/18 1900) Cardiac Rhythm:  [-] Normal sinus rhythm (02/18 1800) Resp:  [0-43] 30 (02/18 1900) BP: (86-138)/(48-86) 130/71 mmHg (02/18 1900) SpO2:  [86 %-100 %] 90 % (02/18 1900) Arterial Line BP: (90-218)/(39-119) 176/69 mmHg (02/18 1815) FiO2 (%):  [40 %-60 %] 40 % (02/18 1000) Weight:  [270 lb 15.1 oz (122.9 kg)] 270 lb 15.1 oz (122.9 kg) (02/18 0350)  Hemodynamic parameters for last 24 hours: PAP: (20-64)/(8-33) 24/10 mmHg CO:  [5 L/min-6.5 L/min] 6.5 L/min CI:  [2.3 L/min/m2-2.9 L/min/m2] 2.9 L/min/m2  Intake/Output from previous day: 02/17 0701 - 02/18 0700 In: 3978.5 [I.V.:3018.5; NG/GT:280; IV Piggyback:650] Out: 2545 [Urine:2165; Chest Tube:380] Intake/Output this shift:    Neuro intact Strong pulses now in all extremities Abdomen distended but without guarding Breath sounds clear Soft systolic murmur  Lab Results:  Recent Labs  10/29/15 1303  10/30/15 0300 10/30/15 1538  WBC 12.9*  --  12.8*  --   HGB 10.9*  < > 10.2* 10.5*  HCT 34.1*  < > 32.4* 31.0*  PLT 125*  --  122*  --   < > = values in this interval not displayed. BMET:  Recent Labs  10/29/15 0340  10/30/15 0300 10/30/15 1538  NA 140  < > 139 138  K 4.1  < > 4.0 3.7  CL 107  < > 108 102  CO2 25  --  24  --   GLUCOSE 120*  < > 104* 121*  BUN 8  < > 8 11  CREATININE 0.84  < > 0.86 0.80  CALCIUM 7.6*  --  7.6*  --   < > = values  in this interval not displayed.  PT/INR:  Recent Labs  10/29/15 0840  LABPROT 17.7*  INR 1.45   ABG    Component Value Date/Time   PHART 7.457* 10/30/2015 1725   HCO3 21.7 10/30/2015 1725   TCO2 23 10/30/2015 1725   ACIDBASEDEF 2.0 10/30/2015 1725   O2SAT 95.0 10/30/2015 1725   CBG (last 3)   Recent Labs  10/30/15 0356 10/30/15 0817 10/30/15 1551  GLUCAP 87 106* 108*    Assessment/Plan: S/P Procedure(s) (LRB): TYPE A AORTIC DISSECTION REPAIR WITH CIRC ARREST (N/A) TRANSESOPHAGEAL ECHOCARDIOGRAM (TEE) (N/A) Continue with diuresis Follow colonic ileus Mobilize with physical therapy   LOS: 5 days    [name] 10/30/2015

## 2015-10-31 ENCOUNTER — Inpatient Hospital Stay (HOSPITAL_COMMUNITY): Payer: Medicaid Other

## 2015-10-31 LAB — CBC
HCT: 32.6 % — ABNORMAL LOW (ref 39.0–52.0)
Hemoglobin: 10.3 g/dL — ABNORMAL LOW (ref 13.0–17.0)
MCH: 25.2 pg — ABNORMAL LOW (ref 26.0–34.0)
MCHC: 31.6 g/dL (ref 30.0–36.0)
MCV: 79.7 fL (ref 78.0–100.0)
Platelets: 154 10*3/uL (ref 150–400)
RBC: 4.09 MIL/uL — ABNORMAL LOW (ref 4.22–5.81)
RDW: 13.5 % (ref 11.5–15.5)
WBC: 15.7 10*3/uL — ABNORMAL HIGH (ref 4.0–10.5)

## 2015-10-31 LAB — POCT I-STAT, CHEM 8
BUN: 19 mg/dL (ref 6–20)
Calcium, Ion: 1.12 mmol/L (ref 1.12–1.23)
Chloride: 99 mmol/L — ABNORMAL LOW (ref 101–111)
Creatinine, Ser: 0.9 mg/dL (ref 0.61–1.24)
Glucose, Bld: 105 mg/dL — ABNORMAL HIGH (ref 65–99)
HCT: 31 % — ABNORMAL LOW (ref 39.0–52.0)
Hemoglobin: 10.5 g/dL — ABNORMAL LOW (ref 13.0–17.0)
Potassium: 3.8 mmol/L (ref 3.5–5.1)
Sodium: 138 mmol/L (ref 135–145)
TCO2: 27 mmol/L (ref 0–100)

## 2015-10-31 LAB — COMPREHENSIVE METABOLIC PANEL
ALT: 49 U/L (ref 17–63)
AST: 137 U/L — ABNORMAL HIGH (ref 15–41)
Albumin: 2.3 g/dL — ABNORMAL LOW (ref 3.5–5.0)
Alkaline Phosphatase: 58 U/L (ref 38–126)
Anion gap: 10 (ref 5–15)
BUN: 15 mg/dL (ref 6–20)
CO2: 25 mmol/L (ref 22–32)
Calcium: 8.2 mg/dL — ABNORMAL LOW (ref 8.9–10.3)
Chloride: 103 mmol/L (ref 101–111)
Creatinine, Ser: 0.97 mg/dL (ref 0.61–1.24)
GFR calc Af Amer: >60 mL/min (ref >60)
GFR calc non Af Amer: >60 mL/min (ref >60)
Glucose, Bld: 139 mg/dL — ABNORMAL HIGH (ref 65–99)
Potassium: 4 mmol/L (ref 3.5–5.1)
Sodium: 138 mmol/L (ref 135–145)
Total Bilirubin: 0.3 mg/dL (ref 0.3–1.2)
Total Protein: 5.6 g/dL — ABNORMAL LOW (ref 6.5–8.1)

## 2015-10-31 LAB — GLUCOSE, CAPILLARY
Glucose-Capillary: 107 mg/dL — ABNORMAL HIGH (ref 65–99)
Glucose-Capillary: 108 mg/dL — ABNORMAL HIGH (ref 65–99)
Glucose-Capillary: 109 mg/dL — ABNORMAL HIGH (ref 65–99)
Glucose-Capillary: 118 mg/dL — ABNORMAL HIGH (ref 65–99)
Glucose-Capillary: 119 mg/dL — ABNORMAL HIGH (ref 65–99)
Glucose-Capillary: 123 mg/dL — ABNORMAL HIGH (ref 65–99)

## 2015-10-31 LAB — TYPE AND SCREEN
ABO/RH(D): O POS
Antibody Screen: NEGATIVE
Unit division: 0
Unit division: 0
Unit division: 0
Unit division: 0

## 2015-10-31 LAB — CARBOXYHEMOGLOBIN
Carboxyhemoglobin: 0.7 % (ref 0.5–1.5)
Methemoglobin: 1 % (ref 0.0–1.5)
O2 Saturation: 52 %
Total hemoglobin: 10.6 g/dL — ABNORMAL LOW (ref 13.5–18.0)

## 2015-10-31 MED ORDER — POTASSIUM CHLORIDE 10 MEQ/50ML IV SOLN
10.0000 meq | INTRAVENOUS | Status: AC
  Administered 2015-10-31 (×2): 10 meq via INTRAVENOUS
  Filled 2015-10-31 (×2): qty 50

## 2015-10-31 MED ORDER — AMIODARONE HCL 200 MG PO TABS
400.0000 mg | ORAL_TABLET | Freq: Two times a day (BID) | ORAL | Status: DC
  Administered 2015-10-31 – 2015-11-04 (×9): 400 mg via ORAL
  Filled 2015-10-31 (×9): qty 2

## 2015-10-31 MED ORDER — LEVALBUTEROL HCL 1.25 MG/0.5ML IN NEBU: 1.2500 mg | INHALATION_SOLUTION | Freq: Four times a day (QID) | RESPIRATORY_TRACT | Status: DC | PRN

## 2015-10-31 MED ORDER — METOLAZONE 5 MG PO TABS
5.0000 mg | ORAL_TABLET | Freq: Every day | ORAL | Status: DC
  Administered 2015-11-01 – 2015-11-02 (×2): 5 mg via ORAL
  Filled 2015-10-31 (×3): qty 1

## 2015-10-31 MED ORDER — SODIUM CHLORIDE 0.9% FLUSH
10.0000 mL | INTRAVENOUS | Status: DC | PRN
  Administered 2015-11-01 – 2015-11-05 (×4): 10 mL
  Filled 2015-10-31 (×4): qty 40

## 2015-10-31 MED ORDER — MILRINONE IN DEXTROSE 20 MG/100ML IV SOLN: 0.2000 ug/kg/min | INTRAVENOUS | Status: DC

## 2015-10-31 MED ORDER — METOPROLOL TARTRATE 25 MG PO TABS
25.0000 mg | ORAL_TABLET | Freq: Two times a day (BID) | ORAL | Status: DC
  Administered 2015-10-31 – 2015-11-01 (×2): 25 mg via ORAL
  Filled 2015-10-31 (×3): qty 1

## 2015-10-31 MED ORDER — SODIUM CHLORIDE 0.9% FLUSH
10.0000 mL | Freq: Two times a day (BID) | INTRAVENOUS | Status: DC
  Administered 2015-10-31 – 2015-11-04 (×8): 10 mL

## 2015-10-31 MED ORDER — BOOST / RESOURCE BREEZE PO LIQD
237.0000 mL | Freq: Three times a day (TID) | ORAL | Status: DC
  Administered 2015-10-31 – 2015-11-05 (×13): 1 via ORAL

## 2015-10-31 MED ORDER — METOPROLOL TARTRATE 25 MG/10 ML ORAL SUSPENSION: 12.5000 mg | Freq: Two times a day (BID) | ORAL | Status: DC

## 2015-10-31 MED ORDER — POTASSIUM CHLORIDE 10 MEQ/50ML IV SOLN
10.0000 meq | INTRAVENOUS | Status: AC
  Administered 2015-10-31 (×3): 10 meq via INTRAVENOUS
  Filled 2015-10-31 (×3): qty 50

## 2015-10-31 NOTE — Progress Notes (Signed)
Peripherally Inserted Central Catheter/Midline Placement  The IV Nurse has discussed with the patient and/or persons authorized to consent for the patient, the purpose of this procedure and the potential benefits and risks involved with this procedure.  The benefits include less needle sticks, lab draws from the catheter and patient may be discharged home with the catheter.  Risks include, but not limited to, infection, bleeding, blood clot (thrombus formation), and puncture of an artery; nerve damage and irregular heat beat.  Alternatives to this procedure were also discussed.  PICC/Midline Placement Documentation  PICC Double Lumen 10/31/15 PICC 49 cm 3 cm (Active)  Indication for Insertion or Continuance of Line Prolonged intravenous therapies 10/31/2015 11:00 AM  Exposed Catheter (cm) 3 cm 10/31/2015 11:00 AM  Dressing Change Due 11/07/15 10/31/2015 11:00 AM       Stacie Glaze Horton 10/31/2015, 11:27 AM

## 2015-10-31 NOTE — Evaluation (Signed)
Physical Therapy Evaluation Patient Details Name: [name] MRN: [id number] DOB: 1961-08-24 Today's Date: 10/31/2015   History of Present Illness  Patient is a 54 yo male admitted 10/25/15 with acute Lt visual field loss.  Patient with Rt PCA infarct.  Also with worsening aortic dissection, s/p surgical repair 10/28/15.  Patient with MI during procedure, and post-op ileus.   PMH:  HTN, aortic dissection, cocaine/tobacco abuse  Clinical Impression  Patient presents with problems listed below.  Will benefit from acute PT to maximize functional independence prior to discharge home with mother. Recommend HHPT at discharge for continued therapy.    Follow Up Recommendations Home health PT;Supervision for mobility/OOB    Equipment Recommendations  Rolling walker with 5" wheels;3in1 (PT)    Recommendations for Other Services       Precautions / Restrictions Precautions Precautions: Fall;Sternal Precaution Comments: Reviewed sternal precautions.  Patient with chest tube Restrictions Weight Bearing Restrictions: Yes Other Position/Activity Restrictions: Sternal precautions      Mobility  Bed Mobility Overal bed mobility: Needs Assistance;+2 for physical assistance Bed Mobility: Rolling;Sidelying to Sit;Sit to Supine Rolling: Min assist Sidelying to sit: Max assist   Sit to supine: Max assist;+2 for physical assistance   General bed mobility comments: Verbal cues for technique to maintain precautions.  Assist to bring LE's off of bed and to raise trunk to sitting position.  Once upright, patient able to maintain balance with min guard assist.  Required +2 assist to return to supine, to control trunk and raise LE's onto bed.  Transfers Overall transfer level: Needs assistance Equipment used: 2 person hand held assist Transfers: Sit to/from [employer] Sit to Stand: Min assist;+2 physical assistance Stand pivot transfers: Min assist;+2 physical assistance        General transfer comment: Verbal cues for technique.  Patient able to scoot to edge of bed.  Assist to rise to standing and for balance.  Assist to control descent onto bed.  Patient then performed stand pivot transfers bed <> BSC with min assist of 2.  Ambulation/Gait Ambulation/Gait assistance: Min assist;+2 safety/equipment Ambulation Distance (Feet): 60 Feet Assistive device:  (Pushed w/c) Gait Pattern/deviations: Step-through pattern;Decreased step length - right;Decreased step length - left;Decreased stride length;Shuffle;Trunk flexed Gait velocity: decreased Gait velocity interpretation: Below normal speed for age/gender General Gait Details: Slow gait speed with flexed posture.  Cues to stand upright.  HR 89 - 100.  O2 sats remained in 90's - ambulated with O2 at 2 l/min.  Stairs            Wheelchair Mobility    Modified Rankin (Stroke Patients Only) Modified Rankin (Stroke Patients Only) Pre-Morbid Rankin Score: No symptoms Modified Rankin: Moderately severe disability (Post-op issues - not from CVA)     Balance Overall balance assessment: Needs assistance         Standing balance support: Bilateral upper extremity supported Standing balance-Leahy Scale: Poor                               Pertinent Vitals/Pain Pain Assessment: 0-10 Pain Score: 5  Pain Location: Generalized pain Pain Descriptors / Indicators: Aching Pain Intervention(s): Monitored during session;Repositioned    Home Living Family/patient expects to be discharged to:: Private residence Living Arrangements: Parent Available Help at Discharge: Family;Available PRN/intermittently Type of Home: House Home Access: Stairs to enter;Ramped entrance Entrance Stairs-Rails: Right;Left Entrance Stairs-Number of Steps: 4 Home Layout: One level Home Equipment: None  Prior Function Level of Independence: Independent         Comments: Patient works as Gaffer        Extremity/Trunk Assessment   Upper Extremity Assessment: Generalized weakness           Lower Extremity Assessment: Generalized weakness         Communication   Communication: No difficulties  Cognition Arousal/Alertness: Awake/alert Behavior During Therapy: WFL for tasks assessed/performed Overall Cognitive Status: Within Functional Limits for tasks assessed                      General Comments      Exercises        Assessment/Plan    PT Assessment Patient needs continued PT services  PT Diagnosis Difficulty walking;Generalized weakness;Acute pain   PT Problem List Decreased strength;Decreased activity tolerance;Decreased balance;Decreased mobility;Decreased knowledge of use of DME;Decreased knowledge of precautions;Cardiopulmonary status limiting activity;Pain  PT Treatment Interventions DME instruction;Gait training;Functional mobility training;Therapeutic activities;Therapeutic exercise;Patient/family education   PT Goals (Current goals can be found in the Care Plan section) Acute Rehab PT Goals Patient Stated Goal: To decrease pain PT Goal Formulation: With patient Time For Goal Achievement: 11/07/15 Potential to Achieve Goals: Good    Frequency Min 3X/week   Barriers to discharge Decreased caregiver support PRN assist available    Co-evaluation               End of Session Equipment Utilized During Treatment: Oxygen Activity Tolerance: Patient limited by fatigue;Patient limited by pain Patient left: in bed;with call bell/phone within reach;with bed alarm set Nurse Communication: Mobility status         Time: 1914-7829 PT Time Calculation (min) (ACUTE ONLY): 35 min   Charges:   PT Evaluation $PT Eval High Complexity: 1 Procedure PT Treatments $Gait Training: 8-22 mins   PT G Codes:        Vena Austria 11-30-15, 3:59 PM Durenda Hurt. Renaldo Fiddler, [facility] Acute Rehab Services Pager [phone]

## 2015-10-31 NOTE — Progress Notes (Signed)
CT surgery p.m. Rounds  Patient had a stable day, ambulated 60 feet with physical therapy Milrinone weaned off Colonic ileus improved, having bowel movements, on full liquid diet Maintaining sinus rhythm, blood pressure controlled with metoprolol and Catapres patch Still with some left visual field cut but no other neuro deficit P.m. labs reviewed and are satisfactory Chest tube output is reduced-we'll remove remaining tubes in a.m.

## 2015-10-31 NOTE — Progress Notes (Signed)
3 Days Post-Op Procedure(s) (LRB): TYPE A AORTIC DISSECTION REPAIR WITH CIRC ARREST (N/A) TRANSESOPHAGEAL ECHOCARDIOGRAM (TEE) (N/A) Subjective: improved in past 24 hrs Colonic ileus better after BM NSR OOB to chair CXR clear R pleural drain 500 cc- leave in place  BP controlled on catapress and lopressor  Objective: Vital signs in last 24 hours: Temp:  [98.2 F (36.8 C)-99.5 F (37.5 C)] 98.2 F (36.8 C) (02/19 0848) Pulse Rate:  [86-138] 94 (02/19 0800) Cardiac Rhythm:  [-] Normal sinus rhythm;Sinus tachycardia (02/19 0800) Resp:  [0-45] 29 (02/19 0800) BP: (85-146)/(52-86) 130/62 mmHg (02/19 0800) SpO2:  [31 %-100 %] 95 % (02/19 0800) Arterial Line BP: (102-218)/(52-119) 155/64 mmHg (02/18 1915) FiO2 (%):  [40 %] 40 % (02/18 1000) Weight:  [264 lb 15.9 oz (120.2 kg)] 264 lb 15.9 oz (120.2 kg) (02/19 0600)  Hemodynamic parameters for last 24 hours: PAP: (24-64)/(8-20) 24/10 mmHg  Intake/Output from previous day: 02/18 0701 - 02/19 0700 In: 1906.8 [P.O.:360; I.V.:1316.8; NG/GT:30; IV Piggyback:200] Out: 2690 [Urine:2020; Chest Tube:670] Intake/Output this shift: Total I/O In: 44.1 [I.V.:44.1] Out: 105 [Urine:45; Chest Tube:60]  Lungs clear Mild edema Neuro intact  Lab Results:  Recent Labs  10/30/15 0300 10/30/15 1538 10/31/15 0401  WBC 12.8*  --  15.7*  HGB 10.2* 10.5* 10.3*  HCT 32.4* 31.0* 32.6*  PLT 122*  --  154   BMET:  Recent Labs  10/30/15 0300 10/30/15 1538 10/31/15 0401  NA 139 138 138  K 4.0 3.7 4.0  CL 108 102 103  CO2 24  --  25  GLUCOSE 104* 121* 139*  BUN CREATININE 0.86 0.80 0.97  CALCIUM 7.6*  --  8.2*    PT/INR:  Recent Labs  10/29/15 0840  LABPROT 17.7*  INR 1.45   ABG    Component Value Date/Time   PHART 7.457* 10/30/2015 1725   HCO3 21.7 10/30/2015 1725   TCO2 23 10/30/2015 1725   ACIDBASEDEF 2.0 10/30/2015 1725   O2SAT 52.0 10/31/2015 0350   CBG (last 3)   Recent Labs  10/30/15 2351 10/31/15 0359  10/31/15 0827  GLUCAP 119* 123* 109*    Assessment/Plan: S/P Procedure(s) (LRB): TYPE A AORTIC DISSECTION REPAIR WITH CIRC ARREST (N/A) TRANSESOPHAGEAL ECHOCARDIOGRAM (TEE) (N/A) Diuresis Mobilize Wean milrinone   LOS: 6 days    [name] 10/31/2015

## 2015-11-01 ENCOUNTER — Encounter (HOSPITAL_COMMUNITY): Payer: Self-pay | Admitting: Cardiothoracic Surgery

## 2015-11-01 ENCOUNTER — Inpatient Hospital Stay (HOSPITAL_COMMUNITY): Payer: Medicaid Other

## 2015-11-01 LAB — GLUCOSE, CAPILLARY
Glucose-Capillary: 72 mg/dL (ref 65–99)
Glucose-Capillary: 86 mg/dL (ref 65–99)
Glucose-Capillary: 91 mg/dL (ref 65–99)
Glucose-Capillary: 96 mg/dL (ref 65–99)
Glucose-Capillary: 96 mg/dL (ref 65–99)

## 2015-11-01 LAB — POCT I-STAT, CHEM 8
BUN: 18 mg/dL (ref 6–20)
Calcium, Ion: 1.09 mmol/L — ABNORMAL LOW (ref 1.12–1.23)
Chloride: 98 mmol/L — ABNORMAL LOW (ref 101–111)
Creatinine, Ser: 0.9 mg/dL (ref 0.61–1.24)
Glucose, Bld: 89 mg/dL (ref 65–99)
HCT: 34 % — ABNORMAL LOW (ref 39.0–52.0)
Hemoglobin: 11.6 g/dL — ABNORMAL LOW (ref 13.0–17.0)
Potassium: 3.6 mmol/L (ref 3.5–5.1)
Sodium: 138 mmol/L (ref 135–145)
TCO2: 30 mmol/L (ref 0–100)

## 2015-11-01 LAB — COMPREHENSIVE METABOLIC PANEL WITH GFR
ALT: 40 U/L (ref 17–63)
AST: 68 U/L — ABNORMAL HIGH (ref 15–41)
Albumin: 2.4 g/dL — ABNORMAL LOW (ref 3.5–5.0)
Alkaline Phosphatase: 59 U/L (ref 38–126)
Anion gap: 12 (ref 5–15)
BUN: 17 mg/dL (ref 6–20)
CO2: 27 mmol/L (ref 22–32)
Calcium: 8.2 mg/dL — ABNORMAL LOW (ref 8.9–10.3)
Chloride: 102 mmol/L (ref 101–111)
Creatinine, Ser: 0.82 mg/dL (ref 0.61–1.24)
GFR calc Af Amer: >60 mL/min (ref >60)
GFR calc non Af Amer: >60 mL/min (ref >60)
Glucose, Bld: 94 mg/dL (ref 65–99)
Potassium: 3.8 mmol/L (ref 3.5–5.1)
Sodium: 141 mmol/L (ref 135–145)
Total Bilirubin: 0.6 mg/dL (ref 0.3–1.2)
Total Protein: 5.9 g/dL — ABNORMAL LOW (ref 6.5–8.1)

## 2015-11-01 LAB — CBC
HCT: 32.1 % — ABNORMAL LOW (ref 39.0–52.0)
Hemoglobin: 10.1 g/dL — ABNORMAL LOW (ref 13.0–17.0)
MCH: 25.2 pg — ABNORMAL LOW (ref 26.0–34.0)
MCHC: 31.5 g/dL (ref 30.0–36.0)
MCV: 80 fL (ref 78.0–100.0)
Platelets: 224 10*3/uL (ref 150–400)
RBC: 4.01 MIL/uL — ABNORMAL LOW (ref 4.22–5.81)
RDW: 13.7 % (ref 11.5–15.5)
WBC: 12.6 10*3/uL — ABNORMAL HIGH (ref 4.0–10.5)

## 2015-11-01 MED ORDER — ALPRAZOLAM 0.25 MG PO TABS
0.2500 mg | ORAL_TABLET | Freq: Two times a day (BID) | ORAL | Status: DC | PRN
  Administered 2015-11-02: 0.25 mg via ORAL
  Filled 2015-11-01: qty 1

## 2015-11-01 MED ORDER — POTASSIUM CHLORIDE 10 MEQ/50ML IV SOLN
10.0000 meq | INTRAVENOUS | Status: AC
  Administered 2015-11-01 (×2): 10 meq via INTRAVENOUS
  Filled 2015-11-01: qty 50

## 2015-11-01 MED ORDER — ENOXAPARIN SODIUM 30 MG/0.3ML ~~LOC~~ SOLN
30.0000 mg | SUBCUTANEOUS | Status: DC
  Administered 2015-11-01: 30 mg via SUBCUTANEOUS
  Filled 2015-11-01: qty 0.3

## 2015-11-01 MED ORDER — LEVALBUTEROL TARTRATE 45 MCG/ACT IN AERO: 2.0000 | INHALATION_SPRAY | Freq: Four times a day (QID) | RESPIRATORY_TRACT | Status: DC | PRN

## 2015-11-01 MED ORDER — ALPRAZOLAM 0.5 MG PO TABS
0.5000 mg | ORAL_TABLET | Freq: Two times a day (BID) | ORAL | Status: DC
  Administered 2015-11-01: 0.5 mg via ORAL
  Filled 2015-11-01: qty 1

## 2015-11-01 MED ORDER — ENOXAPARIN SODIUM 60 MG/0.6ML ~~LOC~~ SOLN
0.5000 mg/kg | SUBCUTANEOUS | Status: DC
  Administered 2015-11-02 – 2015-11-04 (×3): 60 mg via SUBCUTANEOUS
  Filled 2015-11-01 (×3): qty 0.6

## 2015-11-01 MED ORDER — POTASSIUM CHLORIDE 10 MEQ/50ML IV SOLN
10.0000 meq | INTRAVENOUS | Status: AC | PRN
  Administered 2015-11-01 (×3): 10 meq via INTRAVENOUS

## 2015-11-01 MED ORDER — CETYLPYRIDINIUM CHLORIDE 0.05 % MT LIQD
7.0000 mL | Freq: Two times a day (BID) | OROMUCOSAL | Status: DC
  Administered 2015-11-01 – 2015-11-05 (×7): 7 mL via OROMUCOSAL

## 2015-11-01 MED FILL — Magnesium Sulfate Inj 50%: INTRAMUSCULAR | Qty: 10 | Status: AC

## 2015-11-01 NOTE — Progress Notes (Signed)
      [address]       [place] [address]             [phone]       No complaints  BP 128/82 mmHg  Pulse 89  Temp(Src) 98.9 F (37.2 C) (Oral)  Resp 20  Ht  (1.702 m)  Wt 262 lb 9.1 oz (119.1 kg)  BMI 41.11 kg/m2  SpO2 96%   Intake/Output Summary (Last 24 hours) at 11/01/15 1817 Last data filed at 11/01/15 1714  Gross per 24 hour  Intake   2620 ml  Output   1816 ml  Net    804 ml   K= 3.6, Hct= 34  Continue present care  Demarlo Riojas C. Dorris Fetch, MD [facility] [phone]

## 2015-11-01 NOTE — Progress Notes (Signed)
4 Days Post-Op Procedure(s) (LRB): TYPE A AORTIC DISSECTION REPAIR WITH CIRC ARREST (N/A) TRANSESOPHAGEAL ECHOCARDIOGRAM (TEE) (N/A) Subjective: Generally weak after repair type A aortic dissection Had preop CVA with L visual field cut Nsr, BP controlled Walking 60-75 feetnsr Objective: Vital signs in last 24 hours: Temp:  [97.1 F (36.2 C)-98.4 F (36.9 C)] 98.4 F (36.9 C) (02/20 1100) Pulse Rate:  [78-95] 82 (02/20 1400) Cardiac Rhythm:  [-] Normal sinus rhythm;Sinus tachycardia (02/20 0808) Resp:  [19-34] 19 (02/20 1400) BP: (92-169)/(50-99) 138/84 mmHg (02/20 1400) SpO2:  [91 %-100 %] 99 % (02/20 1400) Weight:  [262 lb 9.1 oz (119.1 kg)] 262 lb 9.1 oz (119.1 kg) (02/20 0700)  Hemodynamic parameters for last 24 hours:   stable Intake/Output from previous day: 02/19 0701 - 02/20 0700 In: 2655.7 [P.O.:2030; I.V.:375.7; IV Piggyback:250] Out: 1815 [Urine:1675; Chest Tube:140] Intake/Output this shift: Total I/O In: 700 [P.O.:600; IV Piggyback:100] Out: 885 [Urine:885]       Exam    General- alert and comfortable   Lungs- clear without rales, wheezes   Cor- regular rate and rhythm, no murmur , gallop   Abdomen- soft, non-tender   Extremities - warm, non-tender, minimal edema   Neuro- oriented, appropriate, no focal weakness   Lab Results:  Recent Labs  10/31/15 0401 10/31/15 1708 11/01/15 0412  WBC 15.7*  --  12.6*  HGB 10.3* 10.5* 10.1*  HCT 32.6* 31.0* 32.1*  PLT 154  --  224   BMET:  Recent Labs  10/31/15 0401 10/31/15 1708 11/01/15 0412  NA 138 138 141  K 4.0 3.8 3.8  CL 103 99* 102  CO2 25  --  27  GLUCOSE 139* 105* 94  BUN CREATININE 0.97 0.90 0.82  CALCIUM 8.2*  --  8.2*    PT/INR: No results for input(s): LABPROT, INR in the last 72 hours. ABG    Component Value Date/Time   PHART 7.457* 10/30/2015 1725   HCO3 21.7 10/30/2015 1725   TCO2 27 10/31/2015 1708   ACIDBASEDEF 2.0 10/30/2015 1725   O2SAT 52.0 10/31/2015 0350    CBG (last 3)   Recent Labs  11/01/15 0407 11/01/15 0819 11/01/15 1157  GLUCAP 86 96 91    Assessment/Plan: S/P Procedure(s) (LRB): TYPE A AORTIC DISSECTION REPAIR WITH CIRC ARREST (N/A) TRANSESOPHAGEAL ECHOCARDIOGRAM (TEE) (N/A) Mobilize Diuresis Diabetes control PT and OT consults   LOS: 7 days    [name] 11/01/2015

## 2015-11-01 NOTE — Op Note (Signed)
NAMECHRISTIANO, BLANDON NO.:  [id number]  MEDICAL RECORD NO.:  [id number]  LOCATION:                                 FACILITY:  PHYSICIAN:  Kerin Perna, M.D.  DATE OF BIRTH:  1961/02/27  DATE OF PROCEDURE:  10/29/2015 DATE OF DISCHARGE:                              OPERATIVE REPORT   OPERATION:  Removal of intra-aortic balloon pump from right femoral artery.  SURGEON:  Kerin Perna, M.D.  PREOPERATIVE DIAGNOSIS:  Status post urgent repair of type 1 dissection with intraoperative balloon pump placed for left ventricular dysfunction.  POSTOPERATIVE DIAGNOSIS:  Status post urgent repair of type 1 dissection with intraoperative balloon pump placed for left ventricular dysfunction.  ANESTHESIA:  IV conscious sedation.  OPERATIVE PROCEDURE:  The patient had previously undergone urgent repair of a type A dissection and a balloon pump was placed percutaneously via the right femoral artery to help separate the patient from cardiopulmonary bypass.  The patient's hemodynamic status had been excellent since surgery, and the balloon pump had been weaned from 1 to 1 augmentation and 1 to 3 augmentation with maintenance of good systolic function and normal cardiac output.  Coagulation parameters were checked and were satisfactory.  The balloon pump was felt to be ready to be removed.  A proper time-out was performed.  The sutures securing the balloon pump were removed.  The balloon pump was disconnected from the console and then the balloon was aspirated.  The balloon catheter was then pulled down to the sheath and the sheath and catheter were then pulled through the artery as one unit.  The artery was then quickly covered and pressure was applied by myself for 30 minutes by the clock.  A Doppler pulse signal in the right foot was strongly present during the removal and after compression for the hemostasis of the groin.  A sterile dressing was  applied.     Kerin Perna, M.D.     PV/MEDQ  D:  10/29/2015  T:  10/30/2015  Job:  [id number]

## 2015-11-01 NOTE — Care Management Note (Addendum)
Case Management Note  Patient Details  Name: [name] MRN: [id number] Date of Birth: [birth year]  Subjective/Objective:     Recommend surgical replacement of his ascending aorta and reconstruction of arch vessels               Action/Plan:  11/01/2015  Pt is s/p aortic dissection repair that suffered MI on OR table, pt remains on ventilator with IV sedation, vasopressors, insulin, milrinone, amiodarone.  10/26/15  Pt is from home with mom, pt completely independent prior to admit.  Pt will need assistance with PCP and medications prior discharge   Expected Discharge Date:                  Expected Discharge Plan:  Home/Self Care  In-House Referral:     Discharge planning Services  CM Consult  Post Acute Care Choice:    Choice offered to:  Patient  DME Arranged:  3-N-1, Walker rolling DME Agency:  [facility]  HH Arranged:    HH Agency:     Status of Service:  In process, will continue to follow  Medicare Important Message Given:    Date Medicare IM Given:    Medicare IM give by:    Date Additional Medicare IM Given:    Additional Medicare Important Message give by:     If discussed at Long Length of Stay Meetings, dates discussed:    Additional Comments: 11/01/2015 CSW consulted per attending to refer pt for drug abuse counseling.  [facility] has agreed to provided recommended DME.  Pt will receive DME with [facility] from [facility], agency will continue to follow pt throughout hospital stay and will provide DME prior to discharge.  HHPT recommended however pt is without insurance and stated he can not afford the cost out of pocket. Cherylann Parr, RN 11/01/2015, 8:41 AM

## 2015-11-01 NOTE — Progress Notes (Signed)
Physical Therapy Treatment Patient Details Name: [name] MRN: [id number] DOB: 1961-04-23 Today's Date: 11/01/2015    History of Present Illness Patient is a 55 yo male admitted 10/25/15 with acute Lt visual field loss.  Patient with Rt PCA infarct.  Also with worsening aortic dissection, s/p surgical repair 10/28/15.  Patient with MI during procedure, and post-op ileus.   PMH:  HTN, aortic dissection, cocaine/tobacco abuse    PT Comments    Patient progressing well towards PT goals. Continues to require max verbal and tactile cues to adhere to sternal precautions during mobility/activity. Pt very lethargic today during session due to being given xanax this morning. Improved ambulation distance today with cues. Unsteady on feet.  Reviewed sternal precautions. Will continue to follow per current POC.   Follow Up Recommendations  Home health PT;Supervision for mobility/OOB     Equipment Recommendations  Rolling walker with 5" wheels;3in1 (PT)    Recommendations for Other Services       Precautions / Restrictions Precautions Precautions: Fall;Sternal Precaution Comments: Reviewed sternal precautions.   Restrictions Weight Bearing Restrictions: No Other Position/Activity Restrictions: Sternal precautions    Mobility  Bed Mobility               General bed mobility comments: Sitting in chair upon PT arrival.   Transfers Overall transfer level: Needs assistance Equipment used: None Transfers: Sit to/from Stand Sit to Stand: Min assist;Mod assist;+2 physical assistance         General transfer comment: Verbal cues for technique, using anterior weight shift and use of body momentum. Despite max verbal and tactile cues, pt continually reaching for w/c handles to pull up. Consistently needing cues to hold heart pillow to adhere to precautions. Uncontrolled descent into chair.    Ambulation/Gait Ambulation/Gait assistance: Min assist;+2 safety/equipment Ambulation  Distance (Feet): 70 Feet (+ 100') Assistive device:  (pushed w/c) Gait Pattern/deviations: Step-through pattern;Decreased stride length;Shuffle;Trunk flexed Gait velocity: decreased Gait velocity interpretation: Below normal speed for age/gender General Gait Details: Slow gait speed with flexed posture.  Cues to stand upright.  HR stable..  Difficult to obtain 02 sats due to poor reading. Pt holding breath during mobility. Cues for pursed lip breathing. 1 seated rest break. Ambulated on 4L/min 02.   Stairs            Wheelchair Mobility    Modified Rankin (Stroke Patients Only) Modified Rankin (Stroke Patients Only) Pre-Morbid Rankin Score: No symptoms Modified Rankin: Moderately severe disability     Balance Overall balance assessment: Needs assistance Sitting-balance support: Feet supported;No upper extremity supported Sitting balance-Leahy Scale: Fair     Standing balance support: During functional activity Standing balance-Leahy Scale: Poor Standing balance comment: Reliant on UE support for balance. Lethargic.                    Cognition Arousal/Alertness: Lethargic;Suspect due to medications (given xanax about 1 hour prior to session) Behavior During Therapy: Flat affect Overall Cognitive Status: Within Functional Limits for tasks assessed       Memory: Decreased recall of precautions              Exercises      General Comments General comments (skin integrity, edema, etc.): Sitting BP 116/79; Sitting BP post ambulation 136/85      Pertinent Vitals/Pain Pain Assessment: Faces Faces Pain Scale: No hurt    Home Living  Prior Function            PT Goals (current goals can now be found in the care plan section) Progress towards PT goals: Progressing toward goals    Frequency  Min 3X/week    PT Plan Current plan remains appropriate    Co-evaluation             End of Session Equipment Utilized  During Treatment: Oxygen Activity Tolerance: Patient limited by lethargy;Patient limited by fatigue Patient left: in chair;with call bell/phone within reach     Time: 1129-1155 PT Time Calculation (min) (ACUTE ONLY): 26 min  Charges:  $Gait Training: 8-22 mins $Therapeutic Activity: 8-22 mins                    G Codes:      Anamika Kueker A Christe Tellez 11/01/2015, 12:15 PM Mylo Red, PT, DPT [phone]

## 2015-11-02 ENCOUNTER — Inpatient Hospital Stay (HOSPITAL_COMMUNITY): Payer: Medicaid Other

## 2015-11-02 DIAGNOSIS — F1410 Cocaine abuse, uncomplicated: Secondary | ICD-10-CM

## 2015-11-02 LAB — COMPREHENSIVE METABOLIC PANEL
ALT: 28 U/L (ref 17–63)
AST: 38 U/L (ref 15–41)
Albumin: 2.3 g/dL — ABNORMAL LOW (ref 3.5–5.0)
Alkaline Phosphatase: 60 U/L (ref 38–126)
Anion gap: 10 (ref 5–15)
BUN: 15 mg/dL (ref 6–20)
CO2: 30 mmol/L (ref 22–32)
Calcium: 8.1 mg/dL — ABNORMAL LOW (ref 8.9–10.3)
Chloride: 98 mmol/L — ABNORMAL LOW (ref 101–111)
Creatinine, Ser: 0.83 mg/dL (ref 0.61–1.24)
GFR calc Af Amer: >60 mL/min (ref >60)
GFR calc non Af Amer: >60 mL/min (ref >60)
Glucose, Bld: 105 mg/dL — ABNORMAL HIGH (ref 65–99)
Potassium: 3.7 mmol/L (ref 3.5–5.1)
Sodium: 138 mmol/L (ref 135–145)
Total Bilirubin: 0.2 mg/dL — ABNORMAL LOW (ref 0.3–1.2)
Total Protein: 5.5 g/dL — ABNORMAL LOW (ref 6.5–8.1)

## 2015-11-02 LAB — GLUCOSE, CAPILLARY
Glucose-Capillary: 92 mg/dL (ref 65–99)
Glucose-Capillary: 80 mg/dL (ref 65–99)
Glucose-Capillary: 78 mg/dL (ref 65–99)
Glucose-Capillary: 104 mg/dL — ABNORMAL HIGH (ref 65–99)
Glucose-Capillary: 110 mg/dL — ABNORMAL HIGH (ref 65–99)

## 2015-11-02 LAB — POCT I-STAT, CHEM 8
BUN: 18 mg/dL (ref 6–20)
Calcium, Ion: 1.05 mmol/L — ABNORMAL LOW (ref 1.12–1.23)
Chloride: 91 mmol/L — ABNORMAL LOW (ref 101–111)
Creatinine, Ser: 0.9 mg/dL (ref 0.61–1.24)
Glucose, Bld: 101 mg/dL — ABNORMAL HIGH (ref 65–99)
HCT: 37 % — ABNORMAL LOW (ref 39.0–52.0)
Hemoglobin: 12.6 g/dL — ABNORMAL LOW (ref 13.0–17.0)
Potassium: 3.5 mmol/L (ref 3.5–5.1)
Sodium: 135 mmol/L (ref 135–145)
TCO2: 31 mmol/L (ref 0–100)

## 2015-11-02 LAB — CBC
HCT: 32.3 % — ABNORMAL LOW (ref 39.0–52.0)
Hemoglobin: 10.1 g/dL — ABNORMAL LOW (ref 13.0–17.0)
MCH: 25.1 pg — ABNORMAL LOW (ref 26.0–34.0)
MCHC: 31.3 g/dL (ref 30.0–36.0)
MCV: 80.1 fL (ref 78.0–100.0)
Platelets: 310 10*3/uL (ref 150–400)
RBC: 4.03 MIL/uL — ABNORMAL LOW (ref 4.22–5.81)
RDW: 13.6 % (ref 11.5–15.5)
WBC: 11 10*3/uL — ABNORMAL HIGH (ref 4.0–10.5)

## 2015-11-02 MED ORDER — POTASSIUM CHLORIDE 10 MEQ/50ML IV SOLN
10.0000 meq | INTRAVENOUS | Status: AC | PRN
  Administered 2015-11-02 (×3): 10 meq via INTRAVENOUS

## 2015-11-02 MED ORDER — POTASSIUM CHLORIDE 10 MEQ/50ML IV SOLN
10.0000 meq | INTRAVENOUS | Status: AC | PRN
  Administered 2015-11-02 (×3): 10 meq via INTRAVENOUS
  Filled 2015-11-02: qty 50

## 2015-11-02 NOTE — Progress Notes (Signed)
OT Cancellation Note  Patient Details Name: [name] MRN: [id number] DOB: Dec 02, 1960   Cancelled Treatment:    Reason Eval/Treat Not Completed:  Pt had just ambulated with nurse and now with visitor. Politely declining OT evaluation at this time. Will follow up tomorrow.  Evern Bio 11/02/2015, 4:10 PM

## 2015-11-02 NOTE — Plan of Care (Signed)
Small right pneumo, will alert MD on rounds, pt stable

## 2015-11-02 NOTE — Consult Note (Signed)
[facility] Consult   Reason for Consult:  Substance abuse and s/p aortic dissection surgical repair Referring Physician:  Dr. Prescott Gum Patient Identification: [name] MRN:  [id number] Principal Diagnosis: Cocaine abuse Diagnosis:   Patient Active Problem List   Diagnosis Date Noted  . Aortic dissection, thoracic ([place]) [I71.01] 10/28/2015  . Cerebral thrombosis with cerebral infarction [I63.30] 10/26/2015  . Aortic dissection ([place]) [I71.00] 10/25/2015  . Osteoarthritis of right hip [M16.11] 03/01/2015  . Aortic dissection, thoracoabdominal ([place]) [I71.03] 03/01/2015  . Vitamin D deficiency [E55.9] 01/28/2015  . Nonspecific abnormal electrocardiogram (ECG) (EKG) [R94.31] 01/26/2015  . Pre-diabetes [R73.03] 01/13/2015  . Benign paroxysmal positional vertigo [H81.10] 01/12/2015  . Alcohol abuse [F10.10] 01/07/2015  . Cocaine abuse [F14.10] 01/07/2015  . Tobacco abuse [Z72.0] 01/07/2015  . Essential hypertension [I10]     Total Time spent with patient: 1 hour  Subjective:   [name] is a 55 y.o. male patient admitted with substance abuse.  HPI:  [name] is a 55 years old male seen, chart reviewed for face-to-face psychiatric consultation and evaluation of the substance abuse. Patient stated that I have a drug addiction problem especially using cocaine $200-$300 every week, which is getting worse over years. Patient reportedly started using cocaine when he was 55 years old. Patient feels regrets about losing physical health, his wife who is good, children, not able to have a relationship with the mother and other family members. Patient reportedly working as an Clinical biochemist and has been having sex with the several woman without commitment and also reportedly provided cocaine for sex. Patient feels now he got stuck with his life without any progress not having to life medical wife and children. Patient stated initially he like the way his living and now he  stated that he wanted to change it and he does not want to continue his past life at this time since he been admitted to the hospital and had a surgery. Patient also believes his heart problem and has something to do with the chronic use of cocaine. Patient denied any problem with the work her legal charges related to drugs. Patient denies symptoms of depression, mania, anxiety, auditory/visual hallucinations, delusions and paranoia. Patient agrees to get into outpatient counseling services and guidance regarding changing his lifestyle and stay away from drug of abuse, which is her drug of choice cocaine. Patient denied alcohol and other illicit drug use.   Past Psychiatric History:  Patient denied previous substance abuse treatment either inpatient/residential or outpatient.  Risk to Self: Is patient at risk for suicide?: No Risk to Others:   Prior Inpatient Therapy:   Prior Outpatient Therapy:    Past Medical History:  Past Medical History  Diagnosis Date  . Malignant hypertension Dx 2016  . Aortic dissection, thoracic [facility])     Past Surgical History  Procedure Laterality Date  . Rectal abscess    . Thoracic aortic aneurysm repair N/A 10/28/2015    Procedure: TYPE A AORTIC DISSECTION REPAIR WITH CIRC ARREST;  Surgeon: Ivin Poot, MD;  Location: [facility];  Service: Open Heart Surgery;  Laterality: N/A;  . Tee without cardioversion N/A 10/28/2015    Procedure: TRANSESOPHAGEAL ECHOCARDIOGRAM (TEE);  Surgeon: Ivin Poot, MD;  Location: [facility];  Service: Open Heart Surgery;  Laterality: N/A;   Family History:  Family History  Problem Relation Age of Onset  . Diabetes Father   . Heart disease Father   . Dementia Father    Family  Psychiatric  History: denied  Social History:  History  Alcohol Use No     History  Drug Use No    Comment: Patient denies marijuana and cociane use 01/19/15    Social History   Social History  . Marital Status: Divorced    Spouse Name: N/A  .  Number of Children: N/A  . Years of Education: N/A   Social History Main Topics  . Smoking status: Current Every Day Smoker    Types: Cigarettes  . Smokeless tobacco: None  . Alcohol Use: No  . Drug Use: No     Comment: Patient denies marijuana and cociane use 01/19/15  . Sexual Activity: Yes   Other Topics Concern  . None   Social History Narrative   Additional Social History:    Allergies:  No Known Allergies  Labs:  Results for orders placed or performed during the hospital encounter of 10/25/15 (from the past 48 hour(s))  Glucose, capillary     Status: Abnormal   Collection Time: 10/31/15 12:37 PM  Result Value Ref Range   Glucose-Capillary 107 (H) 65 - 99 mg/dL   Comment 1 Capillary Specimen    Comment 2 Notify RN   Glucose, capillary     Status: Abnormal   Collection Time: 10/31/15  3:46 PM  Result Value Ref Range   Glucose-Capillary 108 (H) 65 - 99 mg/dL   Comment 1 Capillary Specimen    Comment 2 Notify RN   I-STAT, chem 8     Status: Abnormal   Collection Time: 10/31/15  5:08 PM  Result Value Ref Range   Sodium 138 135 - 145 mmol/L   Potassium 3.8 3.5 - 5.1 mmol/L   Chloride 99 (L) 101 - 111 mmol/L   BUN 19 6 - 20 mg/dL   Creatinine, Ser 0.90 0.61 - 1.24 mg/dL   Glucose, Bld 105 (H) 65 - 99 mg/dL   Calcium, Ion 1.12 1.12 - 1.23 mmol/L   TCO2 27 0 - 100 mmol/L   Hemoglobin 10.5 (L) 13.0 - 17.0 g/dL   HCT 31.0 (L) 39.0 - 52.0 %  Glucose, capillary     Status: Abnormal   Collection Time: 10/31/15  8:18 PM  Result Value Ref Range   Glucose-Capillary 118 (H) 65 - 99 mg/dL   Comment 1 Notify RN    Comment 2 Document in Chart   Glucose, capillary     Status: None   Collection Time: 11/01/15 12:46 AM  Result Value Ref Range   Glucose-Capillary 96 65 - 99 mg/dL   Comment 1 Notify RN    Comment 2 Document in Chart   Glucose, capillary     Status: None   Collection Time: 11/01/15  4:07 AM  Result Value Ref Range   Glucose-Capillary 86 65 - 99 mg/dL    Comment 1 Venous Specimen    Comment 2 Notify RN   Comprehensive metabolic panel     Status: Abnormal   Collection Time: 11/01/15  4:12 AM  Result Value Ref Range   Sodium 141 135 - 145 mmol/L   Potassium 3.8 3.5 - 5.1 mmol/L   Chloride 102 101 - 111 mmol/L   CO2 27 22 - 32 mmol/L   Glucose, Bld 94 65 - 99 mg/dL   BUN 17 6 - 20 mg/dL   Creatinine, Ser 0.82 0.61 - 1.24 mg/dL   Calcium 8.2 (L) 8.9 - 10.3 mg/dL   Total Protein 5.9 (L) 6.5 - 8.1 g/dL   Albumin  2.4 (L) 3.5 - 5.0 g/dL   AST 68 (H) 15 - 41 U/L   ALT 40 17 - 63 U/L   Alkaline Phosphatase 59 38 - 126 U/L   Total Bilirubin 0.6 0.3 - 1.2 mg/dL   GFR calc non Af Amer >60 >60 mL/min   GFR calc Af Amer >60 >60 mL/min    Comment: (NOTE) The eGFR has been calculated using the CKD EPI equation. This calculation has not been validated in all clinical situations. eGFR's persistently <60 mL/min signify possible Chronic Kidney Disease.    Anion gap 12 5 - 15  CBC     Status: Abnormal   Collection Time: 11/01/15  4:12 AM  Result Value Ref Range   WBC 12.6 (H) 4.0 - 10.5 K/uL   RBC 4.01 (L) 4.22 - 5.81 MIL/uL   Hemoglobin 10.1 (L) 13.0 - 17.0 g/dL   HCT 32.1 (L) 39.0 - 52.0 %   MCV 80.0 78.0 - 100.0 fL   MCH 25.2 (L) 26.0 - 34.0 pg   MCHC 31.5 30.0 - 36.0 g/dL   RDW 13.7 11.5 - 15.5 %   Platelets 224 150 - 400 K/uL  Glucose, capillary     Status: None   Collection Time: 11/01/15  8:19 AM  Result Value Ref Range   Glucose-Capillary 96 65 - 99 mg/dL   Comment 1 Notify RN   Glucose, capillary     Status: None   Collection Time: 11/01/15 11:57 AM  Result Value Ref Range   Glucose-Capillary 91 65 - 99 mg/dL   Comment 1 Notify RN   I-STAT, chem 8     Status: Abnormal   Collection Time: 11/01/15  3:12 PM  Result Value Ref Range   Sodium 138 135 - 145 mmol/L   Potassium 3.6 3.5 - 5.1 mmol/L   Chloride 98 (L) 101 - 111 mmol/L   BUN 18 6 - 20 mg/dL   Creatinine, Ser 0.90 0.61 - 1.24 mg/dL   Glucose, Bld 89 65 - 99 mg/dL    Calcium, Ion 1.09 (L) 1.12 - 1.23 mmol/L   TCO2 30 0 - 100 mmol/L   Hemoglobin 11.6 (L) 13.0 - 17.0 g/dL   HCT 34.0 (L) 39.0 - 52.0 %  Glucose, capillary     Status: None   Collection Time: 11/01/15  8:57 PM  Result Value Ref Range   Glucose-Capillary 72 65 - 99 mg/dL   Comment 1 Notify RN    Comment 2 Document in Chart   Glucose, capillary     Status: None   Collection Time: 11/02/15 12:08 AM  Result Value Ref Range   Glucose-Capillary 92 65 - 99 mg/dL   Comment 1 Notify RN    Comment 2 Document in Chart   Glucose, capillary     Status: None   Collection Time: 11/02/15  4:13 AM  Result Value Ref Range   Glucose-Capillary 80 65 - 99 mg/dL   Comment 1 Notify RN    Comment 2 Document in Chart   Comprehensive metabolic panel     Status: Abnormal   Collection Time: 11/02/15  4:27 AM  Result Value Ref Range   Sodium 138 135 - 145 mmol/L   Potassium 3.7 3.5 - 5.1 mmol/L   Chloride 98 (L) 101 - 111 mmol/L   CO2 30 22 - 32 mmol/L   Glucose, Bld 105 (H) 65 - 99 mg/dL   BUN 15 6 - 20 mg/dL   Creatinine, Ser 0.83 0.61 -  1.24 mg/dL   Calcium 8.1 (L) 8.9 - 10.3 mg/dL   Total Protein 5.5 (L) 6.5 - 8.1 g/dL   Albumin 2.3 (L) 3.5 - 5.0 g/dL   AST 38 15 - 41 U/L   ALT 28 17 - 63 U/L   Alkaline Phosphatase 60 38 - 126 U/L   Total Bilirubin 0.2 (L) 0.3 - 1.2 mg/dL   GFR calc non Af Amer >60 >60 mL/min   GFR calc Af Amer >60 >60 mL/min    Comment: (NOTE) The eGFR has been calculated using the CKD EPI equation. This calculation has not been validated in all clinical situations. eGFR's persistently <60 mL/min signify possible Chronic Kidney Disease.    Anion gap 10 5 - 15  CBC     Status: Abnormal   Collection Time: 11/02/15  4:27 AM  Result Value Ref Range   WBC 11.0 (H) 4.0 - 10.5 K/uL   RBC 4.03 (L) 4.22 - 5.81 MIL/uL   Hemoglobin 10.1 (L) 13.0 - 17.0 g/dL   HCT 32.3 (L) 39.0 - 52.0 %   MCV 80.1 78.0 - 100.0 fL   MCH 25.1 (L) 26.0 - 34.0 pg   MCHC 31.3 30.0 - 36.0 g/dL   RDW  13.6 11.5 - 15.5 %   Platelets 310 150 - 400 K/uL  Glucose, capillary     Status: None   Collection Time: 11/02/15  8:37 AM  Result Value Ref Range   Glucose-Capillary 78 65 - 99 mg/dL   Comment 1 Capillary Specimen    Comment 2 Notify RN     Current Facility-Administered Medications  Medication Dose Route Frequency Provider Last Rate Last Dose  . 0.9 %  sodium chloride infusion  250 mL Intravenous Continuous Wayne E Gold, PA-C      . acetaminophen (TYLENOL) tablet 1,000 mg  1,000 mg Oral 4 times per day John Giovanni, PA-C   1,000 mg at 11/02/15 0601  . ALPRAZolam Duanne Moron) tablet 0.25 mg  0.25 mg Oral BID PRN Ivin Poot, MD      . amiodarone (PACERONE) tablet 400 mg  400 mg Oral BID Ivin Poot, MD   400 mg at 11/02/15 0913  . antiseptic oral rinse (CPC / CETYLPYRIDINIUM CHLORIDE 0.05%) solution 7 mL  7 mL Mouth Rinse BID Ivin Poot, MD   7 mL at 11/01/15 2200  . aspirin EC tablet 325 mg  325 mg Oral Daily John Giovanni, PA-C   325 mg at 11/02/15 4854   Or  . aspirin chewable tablet 324 mg  324 mg Per Tube Daily John Giovanni, PA-C   324 mg at 10/30/15 0950  . bisacodyl (DULCOLAX) EC tablet 10 mg  10 mg Oral Daily John Giovanni, PA-C   10 mg at 11/02/15 6270   Or  . bisacodyl (DULCOLAX) suppository 10 mg  10 mg Rectal Daily Wayne E Gold, PA-C   10 mg at 10/29/15 1600  . budesonide-formoterol (SYMBICORT) 160-4.5 MCG/ACT inhaler 2 puff  2 puff Inhalation BID Ivin Poot, MD   2 puff at 11/02/15 [phone]  . dextromethorphan-guaiFENesin (MUCINEX DM) 30-600 MG per 12 hr tablet 1 tablet  1 tablet Oral BID Ivin Poot, MD   1 tablet at 11/02/15 0913  . docusate sodium (COLACE) capsule 200 mg  200 mg Oral Daily Wayne E Gold, PA-C   200 mg at 11/02/15 0913  . enoxaparin (LOVENOX) injection 60 mg  0.5 mg/kg Subcutaneous Q24H Reginia Naas,  RPH   60 mg at 11/02/15 0912  . feeding supplement (BOOST / RESOURCE BREEZE) liquid 1 Container  237 mL Oral TID WC Ivin Poot, MD   1  Container at 11/01/15 1527  . furosemide (LASIX) injection 40 mg  40 mg Intravenous BID Ivin Poot, MD   40 mg at 11/02/15 0740  . insulin aspart (novoLOG) injection 0-24 Units  0-24 Units Subcutaneous 6 times per day Ivin Poot, MD   2 Units at 10/31/15 0414  . insulin detemir (LEVEMIR) injection 10 Units  10 Units Subcutaneous BID Ivin Poot, MD   10 Units at 11/02/15 [phone]  . levalbuterol (XOPENEX) nebulizer solution 1.25 mg  1.25 mg Nebulization Q6H PRN Ivin Poot, MD      . metolazone (ZAROXOLYN) tablet 5 mg  5 mg Oral Daily Ivin Poot, MD   5 mg at 11/02/15 0912  . metoprolol (LOPRESSOR) injection 2.5-5 mg  2.5-5 mg Intravenous Q2H PRN Wayne E Gold, PA-C   2.5 mg at 10/30/15 1644  . ondansetron (ZOFRAN) injection 4 mg  4 mg Intravenous Q6H PRN Wayne E Gold, PA-C      . oxyCODONE (Oxy IR/ROXICODONE) immediate release tablet 5 mg  5 mg Oral Q3H PRN Ivin Poot, MD   5 mg at 11/02/15 0913  . pantoprazole (PROTONIX) EC tablet 40 mg  40 mg Oral Daily John Giovanni, PA-C   40 mg at 11/02/15 0913  . sodium chloride flush (NS) 0.9 % injection 10-40 mL  10-40 mL Intracatheter Q12H Ivin Poot, MD   10 mL at 11/02/15 0912  . sodium chloride flush (NS) 0.9 % injection 10-40 mL  10-40 mL Intracatheter PRN Ivin Poot, MD   10 mL at 11/01/15 0456  . thiamine (B-1) injection 100 mg  100 mg Intravenous Daily Ivin Poot, MD   100 mg at 11/02/15 0913  . traMADol (ULTRAM) tablet 50-100 mg  50-100 mg Oral Q4H PRN John Giovanni, PA-C   100 mg at 11/01/15 4268    Musculoskeletal: Strength & Muscle Tone: decreased Gait & Station: unable to stand Patient leans: N/A  Psychiatric Specialty Exam: ROS No Fever-chills, No Headache, No changes with Vision or hearing, reports vertigo No problems swallowing food or Liquids, No Chest pain, Cough or Shortness of Breath, No Abdominal pain, No Nausea or Vommitting, Bowel movements are regular, No Blood in stool or Urine, No  dysuria, No new skin rashes or bruises, No new joints pains-aches,  No new weakness, tingling, numbness in any extremity, No recent weight gain or loss, No polyuria, polydypsia or polyphagia,  A full 10 point Review of Systems was done, except as stated above, all other Review of Systems were negative.  Blood pressure 116/78, pulse 88, temperature 98.6 F (37 C), temperature source Oral, resp. rate 23, height _0  (1.702 m), weight 117.2 kg (258 lb 6.1 oz), SpO2 94 %.Body mass index is 40.46 kg/(m^2).  General Appearance: Casual  Eye Contact::  Good  Speech:  Clear and Coherent  Volume:  Normal  Mood:  Euthymic  Affect:  Appropriate and Congruent  Thought Process:  Coherent and Goal Directed  Orientation:  Full (Time, Place, and Person)  Thought Content:  WDL  Suicidal Thoughts:  No  Homicidal Thoughts:  No  Memory:  Immediate;   Good Recent;   Good  Judgement:  Intact  Insight:  Good  Psychomotor Activity:  Normal  Concentration:  Good  Recall:  Good  Fund of Knowledge:Good  Language: Good  Akathisia:  Negative  Handed:  Right  AIMS (if indicated):     Assets:  Communication Skills Desire for Improvement Financial Resources/Insurance Housing Leisure Time Resilience Transportation  ADL's:  Intact  Cognition: WNL  Sleep:      Treatment Plan Summary: Patient has been suffering with chronic substance abuse versus dependence and has no previous detox treatment or rehabilitation services Safety concerns: Patient has no safety concerns at this time  Recommended No Psychiatric Medication Management   Disposition: Patient benefit from substance abuse intensive outpatient program for 14 days and then outpatient substance abuse counseling services Contact unit social worker regarding providing outpatient programs available for him Patient does not meet criteria for psychiatric inpatient admission. Supportive therapy provided about ongoing stressors. Refer to  IOP.  Durward Parcel., MD 11/02/2015 10:26 AM

## 2015-11-02 NOTE — Progress Notes (Signed)
Patient ID: Carlus Pavlov, male   DOB: 1960/09/19, 55 y.o.   MRN: [id number]  SICU Evening Rounds:  Hemodynamically stable  Diuresing well  Up in chair, no complaints  BMET    Component Value Date/Time   NA 135 11/02/2015 1515   K 3.5 11/02/2015 1515   CL 91* 11/02/2015 1515   CO2 30 11/02/2015 0427   GLUCOSE 101* 11/02/2015 1515   BUN 18 11/02/2015 1515   CREATININE 0.90 11/02/2015 1515   CREATININE 1.29 01/26/2015 1113   CALCIUM 8.1* 11/02/2015 0427   GFRNONAA >60 11/02/2015 0427   GFRAA >60 11/02/2015 0427    Replacing K+

## 2015-11-02 NOTE — Progress Notes (Signed)
5 Days Post-Op Procedure(s) (LRB): TYPE A AORTIC DISSECTION REPAIR WITH CIRC ARREST (N/A) TRANSESOPHAGEAL ECHOCARDIOGRAM (TEE) (N/A) Subjective: Patient slowly progressing after repair of type A dissection Ambulating 50-70 feet Maintaining sinus rhythm Blood pressure controlled with Catapres patch Diuresing with Lasix-metolazone Patient has a long history of cocaine abuse and was counseled by psychiatry today. Clinical social worker consult for referral to rehabilitation.  Objective: Vital signs in last 24 hours: Temp:  [97.5 F (36.4 C)-98.8 F (37.1 C)] 97.5 F (36.4 C) (02/21 1543) Pulse Rate:  [25-96] 86 (02/21 1700) Cardiac Rhythm:  [-] Normal sinus rhythm (02/21 0753) Resp:  [14-33] 18 (02/21 1700) BP: (91-128)/(50-91) 124/79 mmHg (02/21 1700) SpO2:  [80 %-100 %] 92 % (02/21 1700) Weight:  [258 lb 6.1 oz (117.2 kg)] 258 lb 6.1 oz (117.2 kg) (02/21 0500)  Hemodynamic parameters for last 24 hours:    Intake/Output from previous day: 02/20 0701 - 02/21 0700 In: 1930 [P.O.:1680; IV Piggyback:250] Out: 2271 [Urine:2270; Stool:1] Intake/Output this shift: Total I/O In: 1150 [P.O.:900; IV Piggyback:250] Out: 1876 [Urine:1875; Stool:1]  Lungs clear Sternal incision clean and dry Heart rhythm regular without murmur Abdomen soft Minimal peripheral edema, extremities are warm Neuro intact except for left visual field cut  Lab Results:  Recent Labs  11/01/15 0412  11/02/15 0427 11/02/15 1515  WBC 12.6*  --  11.0*  --   HGB 10.1*  < > 10.1* 12.6*  HCT 32.1*  < > 32.3* 37.0*  PLT 224  --  310  --   < > = values in this interval not displayed. BMET:  Recent Labs  11/01/15 0412  11/02/15 0427 11/02/15 1515  NA 141  < > 138 135  K 3.8  < > 3.7 3.5  CL 102  < > 98* 91*  CO2 27  --  30  --   GLUCOSE 94  < > 105* 101*  BUN 17  < > 15 18  CREATININE 0.82  < > 0.83 0.90  CALCIUM 8.2*  --  8.1*  --   < > = values in this interval not displayed.  PT/INR: No results  for input(s): LABPROT, INR in the last 72 hours. ABG    Component Value Date/Time   PHART 7.457* 10/30/2015 1725   HCO3 21.7 10/30/2015 1725   TCO2 31 11/02/2015 1515   ACIDBASEDEF 2.0 10/30/2015 1725   O2SAT 52.0 10/31/2015 0350   CBG (last 3)   Recent Labs  11/02/15 0413 11/02/15 0837 11/02/15 1220  GLUCAP 80 78 104*    Assessment/Plan: S/P Procedure(s) (LRB): TYPE A AORTIC DISSECTION REPAIR WITH CIRC ARREST (N/A) TRANSESOPHAGEAL ECHOCARDIOGRAM (TEE) (N/A) Mobilize Diuresis Patient has requested to help from social services for substance abuse   LOS: 8 days    Kathlee Nations Trigt III 11/02/2015

## 2015-11-02 NOTE — Progress Notes (Signed)
UR Completed. Betzabe Bevans, RN, BSN.  [phone] 

## 2015-11-03 ENCOUNTER — Inpatient Hospital Stay (HOSPITAL_COMMUNITY): Payer: Medicaid Other

## 2015-11-03 LAB — CBC
HCT: 32.2 % — ABNORMAL LOW (ref 39.0–52.0)
Hemoglobin: 10.3 g/dL — ABNORMAL LOW (ref 13.0–17.0)
MCH: 25.2 pg — ABNORMAL LOW (ref 26.0–34.0)
MCHC: 32 g/dL (ref 30.0–36.0)
MCV: 78.7 fL (ref 78.0–100.0)
Platelets: 398 10*3/uL (ref 150–400)
RBC: 4.09 MIL/uL — ABNORMAL LOW (ref 4.22–5.81)
RDW: 13.4 % (ref 11.5–15.5)
WBC: 10.4 10*3/uL (ref 4.0–10.5)

## 2015-11-03 LAB — GLUCOSE, CAPILLARY
Glucose-Capillary: 85 mg/dL (ref 65–99)
Glucose-Capillary: 94 mg/dL (ref 65–99)
Glucose-Capillary: 95 mg/dL (ref 65–99)

## 2015-11-03 LAB — BASIC METABOLIC PANEL
Anion gap: 9 (ref 5–15)
BUN: 13 mg/dL (ref 6–20)
CO2: 31 mmol/L (ref 22–32)
Calcium: 8.2 mg/dL — ABNORMAL LOW (ref 8.9–10.3)
Chloride: 93 mmol/L — ABNORMAL LOW (ref 101–111)
Creatinine, Ser: 0.85 mg/dL (ref 0.61–1.24)
GFR calc Af Amer: >60 mL/min (ref >60)
GFR calc non Af Amer: >60 mL/min (ref >60)
Glucose, Bld: 102 mg/dL — ABNORMAL HIGH (ref 65–99)
Potassium: 3.3 mmol/L — ABNORMAL LOW (ref 3.5–5.1)
Sodium: 133 mmol/L — ABNORMAL LOW (ref 135–145)

## 2015-11-03 MED ORDER — FUROSEMIDE 40 MG PO TABS
40.0000 mg | ORAL_TABLET | Freq: Every day | ORAL | Status: DC
  Administered 2015-11-04 – 2015-11-05 (×2): 40 mg via ORAL
  Filled 2015-11-03 (×2): qty 1

## 2015-11-03 MED ORDER — POTASSIUM CHLORIDE 10 MEQ/50ML IV SOLN
10.0000 meq | INTRAVENOUS | Status: AC
  Administered 2015-11-03 (×2): 10 meq via INTRAVENOUS
  Filled 2015-11-03 (×2): qty 50

## 2015-11-03 MED ORDER — POTASSIUM CHLORIDE CRYS ER 20 MEQ PO TBCR
20.0000 meq | EXTENDED_RELEASE_TABLET | Freq: Every day | ORAL | Status: DC
  Administered 2015-11-04: 20 meq via ORAL
  Filled 2015-11-03: qty 1

## 2015-11-03 MED ORDER — POTASSIUM CHLORIDE 10 MEQ/50ML IV SOLN
10.0000 meq | INTRAVENOUS | Status: AC
  Administered 2015-11-03 (×3): 10 meq via INTRAVENOUS
  Filled 2015-11-03 (×3): qty 50

## 2015-11-03 NOTE — Progress Notes (Signed)
Inpatient Rehabilitation  Patient was screened by Missael Ferrari for appropriateness for an Inpatient Acute Rehab consult.  At this time we are recommending an Inpatient Rehab consult.  Please order if you are agreeable.    Cylus Douville, M.A., CCC/SLP Admission Coordinator  [facility]  Cell [phone]  

## 2015-11-03 NOTE — Progress Notes (Signed)
Patient has arrived on unit from 2 south. Patient oriented to unit, assessed, placed on tele x2 verifications, VS were stable, and no pain.

## 2015-11-03 NOTE — Progress Notes (Signed)
Physical Therapy Treatment Patient Details Name: [name] MRN: [id number] DOB: 22-Oct-1960 Today's Date: 11/03/2015    History of Present Illness Patient is a 55 yo male admitted 10/25/15 with acute Lt visual field loss.  Patient with Rt PCA infarct.  Also with worsening aortic dissection, s/p surgical repair 10/28/15.  Patient with MI during procedure, and post-op ileus.   PMH:  HTN, aortic dissection, cocaine/tobacco abuse    PT Comments    Patient presents with L field cut and R side weakness.  Feel he is high fall risk and mother only can provide supervision.  Feel CIR level rehab appropriate for progressing safety and independence prior to d/c home.  Follow Up Recommendations  CIR     Equipment Recommendations  Rolling walker with 5" wheels    Recommendations for Other Services Rehab consult     Precautions / Restrictions Precautions Precautions: Fall;Sternal Precaution Comments: Reviewed sternal precautions.   Restrictions Other Position/Activity Restrictions: Sternal precautions    Mobility  Bed Mobility Overal bed mobility: Needs Assistance Bed Mobility: Sit to Sidelying     Supine to sit: Supervision;HOB elevated   Sit to sidelying: Supervision General bed mobility comments: cues for technique  Transfers Overall transfer level: Needs assistance Equipment used: Rolling walker (2 wheeled) Transfers: Sit to/from Stand Sit to Stand: Min assist         General transfer comment: increased time, cues to use momentum for decreased UE support  Ambulation/Gait Ambulation/Gait assistance: Mod assist Ambulation Distance (Feet): 150 Feet (x 2) Assistive device: Rolling walker (2 wheeled) Gait Pattern/deviations: Step-to pattern;Shuffle     General Gait Details: initially keeping R pelvis retracted, facilitation provided for R pelvic protraction, cues and assist for attending to obstacles on L side   Stairs            Wheelchair Mobility     Modified Rankin (Stroke Patients Only) Modified Rankin (Stroke Patients Only) Pre-Morbid Rankin Score: No symptoms Modified Rankin: Moderately severe disability     Balance Overall balance assessment: Needs assistance Sitting-balance support: Feet supported Sitting balance-Leahy Scale: Fair     Standing balance support: During functional activity Standing balance-Leahy Scale: Poor Standing balance comment: UE support for balance due to weakness                    Cognition Arousal/Alertness: Awake/alert Behavior During Therapy: WFL for tasks assessed/performed Overall Cognitive Status: Impaired/Different from baseline Area of Impairment: Safety/judgement         Safety/Judgement: Decreased awareness of safety          Exercises      General Comments General comments (skin integrity, edema, etc.): HR 110 max with ambulation, SpO2 89-93% improved after using flutter valve, BP 128/73 post ambulation      Pertinent Vitals/Pain Pain Assessment: 0-10 Pain Score: 6  Pain Location: R side of belly Pain Descriptors / Indicators: Discomfort Pain Intervention(s): Repositioned;Monitored during session;Patient requesting pain meds-RN notified    Home Living Family/patient expects to be discharged to:: Private residence Living Arrangements: Parent Available Help at Discharge: Family;Available PRN/intermittently Type of Home: House Home Access: Stairs to enter;Ramped entrance Entrance Stairs-Rails: Right;Left Home Layout: One level Home Equipment: Walker - 4 wheels      Prior Function Level of Independence: Independent      Comments: Patient works as Personnel officer   PT Goals (current goals can now be found in the care plan section) Acute Rehab PT Goals Patient Stated Goal: to be independent Progress towards  PT goals: Progressing toward goals    Frequency  Min 3X/week    PT Plan Discharge plan needs to be updated    Co-evaluation             End  of Session Equipment Utilized During Treatment: Gait belt Activity Tolerance: Patient limited by fatigue Patient left: in bed;with call bell/phone within reach     Time: 1545-1604 PT Time Calculation (min) (ACUTE ONLY): 19 min  Charges:  $Gait Training: 8-22 mins                    G Codes:      Elray Mcgregor 12/03/15, 5:05 PM  Sheran Lawless, PT [phone] 2015-12-03

## 2015-11-03 NOTE — Evaluation (Signed)
Occupational Therapy Evaluation Patient Details Name: [name] MRN: [id number] DOB: 30-Mar-1961 Today's Date: 11/03/2015    History of Present Illness Patient is a 55 yo male admitted 10/25/15 with acute Lt visual field loss.  Patient with Rt PCA infarct.  Also with worsening aortic dissection, s/p surgical repair 10/28/15.  Patient with MI during procedure, and post-op ileus.   PMH:  HTN, aortic dissection, cocaine/tobacco abuse   Clinical Impression   Pt is independent at baseline.  Presents with L visual field deficit, impaired balance and decreased safety awareness interfering with ability to perform ADL and mobility. Pt plans to discharge home with his mother. Recommending brief period of inpatient rehab prior to return home. Will follow acutely.    Follow Up Recommendations  CIR;Supervision/Assistance - 24 hour    Equipment Recommendations  3 in 1 bedside comode    Recommendations for Other Services Rehab consult     Precautions / Restrictions Precautions Precautions: Fall;Sternal Precaution Comments: Reviewed sternal precautions.   Restrictions Other Position/Activity Restrictions: Sternal precautions      Mobility Bed Mobility Overal bed mobility: Needs Assistance Bed Mobility: Supine to Sit     Supine to sit: Supervision;HOB elevated     General bed mobility comments: no physical assist, used heart pillow  Transfers Overall transfer level: Needs assistance Equipment used: Rolling walker (2 wheeled) Transfers: Sit to/from Stand Sit to Stand: Min assist         General transfer comment: instructed to use momentum, wanting to pull up on walker, assist to steady    Balance Overall balance assessment: Needs assistance Sitting-balance support: Feet supported Sitting balance-Leahy Scale: Fair     Standing balance support: During functional activity Standing balance-Leahy Scale: Poor                              ADL Overall ADL's : Needs  assistance/impaired Eating/Feeding: Independent;Sitting Eating/Feeding Details (indicate cue type and reason): pt turned plate to help him see all of the contents Grooming: Wash/dry hands;Minimal assistance;Standing Grooming Details (indicate cue type and reason): assist to locate soap and towels on L side, cues for aligning walker with sink Upper Body Bathing: Sitting;Minimal assitance   Lower Body Bathing: Minimal assistance;Sit to/from stand Lower Body Bathing Details (indicate cue type and reason): unable to reach R foot Upper Body Dressing : Set up;Sitting Upper Body Dressing Details (indicate cue type and reason): changed soiled gown Lower Body Dressing: Minimal assistance;Sit to/from stand Lower Body Dressing Details (indicate cue type and reason): unable to reach R foot, can cross L foot over opposite knee to don and doff sock Toilet Transfer: Minimal assistance;Ambulation;RW   Toileting- Clothing Manipulation and Hygiene: Minimal assistance;Sit to/from stand Toileting - Clothing Manipulation Details (indicate cue type and reason): LOB with pericare     Functional mobility during ADLs: Rolling walker;Moderate assistance       Vision Vision Assessment?: Yes Additional Comments: L field cut   Perception     Praxis      Pertinent Vitals/Pain Pain Assessment: 0-10 Pain Score: 6  Pain Location: R side of abdomen Pain Descriptors / Indicators: Discomfort Pain Intervention(s): Monitored during session;Patient requesting pain meds-RN notified     Hand Dominance Right   Extremity/Trunk Assessment Upper Extremity Assessment Upper Extremity Assessment: Overall WFL for tasks assessed (grossly)   Lower Extremity Assessment Lower Extremity Assessment: Defer to PT evaluation   Cervical / Trunk Assessment Cervical / Trunk Assessment: Normal  Communication Communication Communication: No difficulties   Cognition Arousal/Alertness: Awake/alert Behavior During Therapy:  Flat affect Overall Cognitive Status: Impaired/Different from baseline Area of Impairment: Safety/judgement         Safety/Judgement: Decreased awareness of safety         General Comments       Exercises       Shoulder Instructions      Home Living Family/patient expects to be discharged to:: Private residence Living Arrangements: Parent Available Help at Discharge: Family;Available PRN/intermittently Type of Home: House Home Access: Stairs to enter;Ramped entrance Entrance Stairs-Number of Steps: 4 Entrance Stairs-Rails: Right;Left Home Layout: One level     Bathroom Shower/Tub: Chief Strategy Officer: Handicapped height     Home Equipment: Environmental consultant - 4 wheels          Prior Functioning/Environment Level of Independence: Independent        Comments: Patient works as Personnel officer    OT Diagnosis: Generalized weakness;Acute pain;Cognitive deficits;Disturbance of vision   OT Problem List: Decreased strength;Decreased activity tolerance;Impaired balance (sitting and/or standing);Impaired vision/perception;Decreased coordination;Decreased safety awareness;Decreased knowledge of use of DME or AE;Obesity;Pain;Impaired UE functional use   OT Treatment/Interventions: Self-care/ADL training;DME and/or AE instruction;Therapeutic activities;Patient/family education;Balance training    OT Goals(Current goals can be found in the care plan section) Acute Rehab OT Goals Patient Stated Goal: to be independent OT Goal Formulation: With patient Time For Goal Achievement: 11/17/15 Potential to Achieve Goals: Good ADL Goals Pt Will Perform Grooming: with modified independence;standing Pt Will Perform Lower Body Bathing: with modified independence;sit to/from stand;with adaptive equipment Pt Will Perform Lower Body Dressing: with modified independence;with adaptive equipment;sit to/from stand Pt Will Transfer to Toilet: with modified independence;ambulating Pt  Will Perform Toileting - Clothing Manipulation and hygiene: with modified independence;sit to/from stand Pt Will Perform Tub/Shower Transfer: Tub transfer;with supervision;ambulating;rolling walker;shower seat Additional ADL Goal #1: Pt will employ compensatory strategies for L visual field deficit independently. Additional ADL Goal #2: Pt will adhere to sternal precautions independently.  OT Frequency: Min 2X/week   Barriers to D/C:            Co-evaluation              End of Session Equipment Utilized During Treatment: Gait belt;Rolling walker Nurse Communication: Patient requests pain meds  Activity Tolerance: Patient tolerated treatment well Patient left: Other (comment) (walking with PT)   Time: 3875-6433 OT Time Calculation (min): 28 min Charges:  OT General Charges $OT Visit: 1 Procedure OT Evaluation $OT Eval Moderate Complexity: 1 Procedure OT Treatments $Self Care/Home Management : 8-22 mins G-Codes:    Evern Bio 11/03/2015, 4:14 PM [phone]

## 2015-11-03 NOTE — Progress Notes (Signed)
6 Days Post-Op Procedure(s) (LRB): TYPE A AORTIC DISSECTION REPAIR WITH CIRC ARREST (N/A) TRANSESOPHAGEAL ECHOCARDIOGRAM (TEE) (N/A) Subjective: Doing well 6 days after urgent repair of ascending thoracic aortic Dissection type A Preoperative right occipital CVA with left visual field cut COPD, history of cocaine inhalation Hypertension-currently well-controlled on clonidine patch-we'll transition to by mouth clonidine tomorrow Surgical incisions healing well Maintaining sinus rhythm on amiodarone Ready for transfer to step down Appreciate psychiatry evaluation for his history of substance abuse-appears to be candidate for inpatient drug rehabilitation and we'll ask clinical social worker to assist in exploring this option  Objective: Vital signs in last 24 hours: Temp:  [97.4 F (36.3 C)-98.6 F (37 C)] 98.2 F (36.8 C) (02/22 0852) Pulse Rate:  [72-91] 88 (02/22 0826) Cardiac Rhythm:  [-] Normal sinus rhythm (02/22 0800) Resp:  [7-24] 23 (02/22 0826) BP: (88-127)/(50-79) 107/72 mmHg (02/22 0826) SpO2:  [90 %-100 %] 100 % (02/22 0826) Weight:  [251 lb 8.7 oz (114.1 kg)] 251 lb 8.7 oz (114.1 kg) (02/22 0500)  Hemodynamic parameters for last 24 hours:  stable blood pressure, sinus rhythm  Intake/Output from previous day: 02/21 0701 - 02/22 0700 In: 1440 [P.O.:1140; IV Piggyback:300] Out: 4681 [Urine:4680; Stool:1] Intake/Output this shift: Total I/O In: 50 [IV Piggyback:50] Out: -   Exam  Alert and comfortable Lungs clear Heart rate regular, no murmur Sternotomy incision healing well, left groin incision healing well Good peripheral pulses Visual cut left visual field stable  Lab Results:  Recent Labs  11/02/15 0427 11/02/15 1515 11/03/15 0507  WBC 11.0*  --  10.4  HGB 10.1* 12.6* 10.3*  HCT 32.3* 37.0* 32.2*  PLT 310  --  398   BMET:  Recent Labs  11/02/15 0427 11/02/15 1515 11/03/15 0507  NA 138 135 133*  K 3.7 3.5 3.3*  CL 98* 91* 93*  CO2 30  --   31  GLUCOSE 105* 101* 102*  BUN CREATININE 0.83 0.90 0.85  CALCIUM 8.1*  --  8.2*    PT/INR: No results for input(s): LABPROT, INR in the last 72 hours. ABG    Component Value Date/Time   PHART 7.457* 10/30/2015 1725   HCO3 21.7 10/30/2015 1725   TCO2 31 11/02/2015 1515   ACIDBASEDEF 2.0 10/30/2015 1725   O2SAT 52.0 10/31/2015 0350   CBG (last 3)   Recent Labs  11/03/15 0050 11/03/15 0349 11/03/15 0848  GLUCAP 95 85 94    Assessment/Plan: S/P Procedure(s) (LRB): TYPE A AORTIC DISSECTION REPAIR WITH CIRC ARREST (N/A) TRANSESOPHAGEAL ECHOCARDIOGRAM (TEE) (N/A) Mobilize Diuresis Plan for transfer to step-down: see transfer orders   LOS: 9 days    Kathlee Nations Trigt III 11/03/2015

## 2015-11-03 NOTE — Progress Notes (Addendum)
Nutrition Follow Up  DOCUMENTATION CODES:   Obesity unspecified  INTERVENTION:    Continue Heart Healthy/Carbohydrate Modified diet  NUTRITION DIAGNOSIS:   Inadequate oral intake related to inability to eat as evidenced by NPO status, resolved  GOAL:   Patient will meet greater than or equal to 90% of their needs, met  MONITOR:   PO intake, Labs, Weight trends, I & O's  ASSESSMENT:   55 yo Male with acute right occipital stroke with left hemianopsia related to aortic dissection involving the distal ascending aorta and aortic arch. Patient was recommended urgent repair with replacement of the ascending aorta and reconstruction of arch vessels.  Patient s/p procedures 2/16: TYPE A AORTIC DISSECTION REPAIR WITH 28 HEMASHIELD AORTO -AORTIC ANASTAMOSIS , CIRC ARREST  Patient extubated 2/18. States his appetite is very good. PO intake 80-100% per flowsheet records.  Diet Order:  Diet heart healthy/carb modified Room service appropriate?: Yes; Fluid consistency:: Thin  Skin:  Reviewed, no issues  Last BM:  2/21  Height:   Ht Readings from Last 1 Encounters:  10/25/15 '5\' 7"'$  (1.702 m)    Weight:   Wt Readings from Last 1 Encounters:  11/03/15 251 lb 8.7 oz (114.1 kg)    Ideal Body Weight:  67.2 kg  BMI:  Body mass index is 39.39 kg/(m^2).  Estimated Nutritional Needs:   Kcal:  2100-2300  Protein:  110-120 gm  Fluid:  2.1-2.3 L  EDUCATION NEEDS:   No education needs identified at this time  Arthur Holms, RD, LDN Pager #: [phone] After-Hours Pager #: [phone]

## 2015-11-03 NOTE — Progress Notes (Signed)
Pt transferred to 2W22 with belongings. Report given to receiving RN and all questions answered. VSS during transfer.  Pt assisted to bed in new room. Family updated on patient's location.  

## 2015-11-04 MED ORDER — METOPROLOL TARTRATE 12.5 MG HALF TABLET: 12.5000 mg | ORAL_TABLET | Freq: Two times a day (BID) | ORAL | Status: DC

## 2015-11-04 MED ORDER — POLYETHYLENE GLYCOL 3350 17 G PO PACK
17.0000 g | PACK | Freq: Every day | ORAL | Status: DC
  Administered 2015-11-04 – 2015-11-05 (×2): 17 g via ORAL
  Filled 2015-11-04 (×2): qty 1

## 2015-11-04 MED ORDER — CLONIDINE HCL 0.2 MG PO TABS
0.2000 mg | ORAL_TABLET | Freq: Two times a day (BID) | ORAL | Status: DC
  Administered 2015-11-04 – 2015-11-05 (×2): 0.2 mg via ORAL
  Filled 2015-11-04 (×2): qty 1

## 2015-11-04 MED ORDER — POTASSIUM CHLORIDE CRYS ER 20 MEQ PO TBCR
20.0000 meq | EXTENDED_RELEASE_TABLET | Freq: Two times a day (BID) | ORAL | Status: DC
  Administered 2015-11-04 – 2015-11-05 (×2): 20 meq via ORAL
  Filled 2015-11-04 (×2): qty 1

## 2015-11-04 MED ORDER — ENOXAPARIN SODIUM 40 MG/0.4ML ~~LOC~~ SOLN
40.0000 mg | SUBCUTANEOUS | Status: DC
  Administered 2015-11-05: 40 mg via SUBCUTANEOUS
  Filled 2015-11-04: qty 0.4

## 2015-11-04 MED ORDER — AMIODARONE HCL 200 MG PO TABS
200.0000 mg | ORAL_TABLET | Freq: Two times a day (BID) | ORAL | Status: DC
  Administered 2015-11-04 – 2015-11-05 (×2): 200 mg via ORAL
  Filled 2015-11-04 (×2): qty 1

## 2015-11-04 NOTE — Discharge Summary (Signed)
Physician Discharge Summary  Patient ID: [name] MRN: [id number] DOB/AGE: [birth year] 55 y.o.  Admit date: 10/25/2015 Discharge date: 11/05/2015  Admission Diagnoses: Type A Aortic dissection  Discharge Diagnoses:  Principal Problem:   Cocaine abuse Active Problems:   Aortic dissection (HCC)   Cerebral thrombosis with cerebral infarction   Aortic dissection, thoracic [facility])  Patient Active Problem List   Diagnosis Date Noted  . Aortic dissection, thoracic (HCC) 10/28/2015  . Cerebral thrombosis with cerebral infarction 10/26/2015  . Aortic dissection (HCC) 10/25/2015  . Osteoarthritis of right hip 03/01/2015  . Aortic dissection, thoracoabdominal (HCC) 03/01/2015  . Vitamin D deficiency 01/28/2015  . Nonspecific abnormal electrocardiogram (ECG) (EKG) 01/26/2015  . Pre-diabetes 01/13/2015  . Benign paroxysmal positional vertigo 01/12/2015  . Alcohol abuse 01/07/2015  . Cocaine abuse 01/07/2015  . Tobacco abuse 01/07/2015  . Essential hypertension     History of Present Illness: At time of consultation 55 year old AA male hypertensive smoker with history of alcohol and cocaine abuse presented to the ED with fairly sudden onset of left visual field deficit, headache, and some right shoulder discomfort. CT scan of the head shows a 4 cm right occipital CVA correlating with his visual deficit. The patient has a known history of type B descending thoracic aorta dissection which was diagnosed April 2016. He was recommended to take his blood pressure medications and to stop cocaine and tobacco abuse.  He presented to the ED yesterday after using cocaine the night before. CT scan currently shows extension of the dissection to the ascending aorta above the aortic root involving the takeoff of the innominate artery and extending into the right carotid and right subclavian arteries. The right vertebral artery is occluded. The diameter of the ascending aorta prior to the dissection was  approximate 4 cm. Previous echocardiogram last April showed no evidence of aortic valve disease with good LV systolic function.  The patient was recommended urgent-emergent cardiac surgery for replacement of his ascending aorta and reconstruction of the arch vessels but he declined    Discharged Condition: good  Hospital Course: The patient was admitted with neurological symptoms as described as well as the findings consistent with aortic dissection.. As stated above the patient initially refused surgery. His blood pressure was stabilized medically and he did subsequently agree to proceed with the procedure. It was scheduled and performed on 10/27/2015 by Dr. Donata Clay  as described below. He tolerated the procedure well was taken to the surgical intensive care unit in critical but stable condition. Postoperatively the patient has progressed nicely. He maintained stable hemodynamics on the intra-aortic balloon with inotropic support which were weaned over time. The balloon pump was removed on 10/30/2015 by Dr. Donata Clay.Marland Kitchen He was weaned from the ventilator using standard protocols. Blood pressures under adequate control. Oxygen has been weaned and he maintains adequate saturations on room air. He has been seen by psychiatry due to his history of cocaine abuse who recommends inpatient rehabilitation however he refuses this currently. Due to his stroke he is recommended inpatient CIR when medically stable as he is deconditioned. He does have some postoperative volume overload which is responding to diuretics. His renal function is normal. He has expected acute blood loss anemia which is stable. He is maintaining sinus rhythm. Incisions are noted be healing well without evidence of infection. He is tolerating diet. He is terribly felt to be stable for transfer to inpatient CIR in the next 24-48 hours pending ongoing reevaluation of his overall recovery.  Consults: neurology, rehabilitation medicine and  psychiatry  Significant Diagnostic Studies: radiology: Head CT scan without contrast, CT angiogram of the head and neck with contrast, CT  of the abdomen with contrast, CT angiography of the chest with contrast.  Treatments: surgery:   DATE OF PROCEDURE: 10/28/2015 DATE OF DISCHARGE: 10/28/2015   OPERATIVE REPORT   OPERATION: 1. Repair of type A ascending aortic dissection with replacement of  the ascending aorta and hemi arch reconstruction using a 28-mm  Hemashield graft. 2. Hypothermic circulatory arrest with retrograde cerebral perfusion. 3. Placement of intra-aortic balloon pump.  SURGEON: Kerin Perna, M.D.  ASSISTANT: Rowe Clack, P.A.-C.  ANESTHESIA: General by Dr. Jairo Ben.  PREOPERATIVE DIAGNOSIS: Acute type A thoracic aortic dissection with associated right occipital cerebrovascular accident.  POSTOPERATIVE DIAGNOSIS: Acute type A thoracic aortic dissection with associated right occipital cerebrovascular accident.  Discharge Exam: Blood pressure 107/69, pulse 88, temperature 98.6 F (37 C), temperature source Oral, resp. rate 21, height  (1.702 m), weight 240 lb 9.6 oz (109.135 kg), SpO2 93 %.   General appearance: alert, cooperative and no distress Heart: regular rate and rhythm Lungs: dim in lower fields Abdomen: benign Extremities: no edema Wound: incis healing well  Disposition: 01-Home or Self Care      Discharge Instructions    Ambulatory referral to Neurology    Complete by:  As directed   Pt will follow up with Dr. Roda Shutters at [facility] in about 2 months. Thanks.            Medication List    STOP taking these medications        acetaminophen-codeine 300-30 MG tablet  Commonly known as:  TYLENOL #3     ergocalciferol 50000 units capsule  Commonly known as:  DRISDOL     losartan 100 MG tablet  Commonly known as:  COZAAR     metoprolol tartrate 25 MG tablet  Commonly known as:   LOPRESSOR      TAKE these medications        ALPRAZolam 0.25 MG tablet  Commonly known as:  XANAX  Take 1 tablet (0.25 mg total) by mouth 2 (two) times daily as needed for anxiety.     amiodarone 200 MG tablet  Commonly known as:  PACERONE  Take 1 tablet (200 mg total) by mouth 2 (two) times daily.     antiseptic oral rinse 0.05 % Liqd solution  Commonly known as:  CPC / CETYLPYRIDINIUM CHLORIDE 0.05%  7 mLs by Mouth Rinse route 2 (two) times daily.     aspirin 325 MG EC tablet  Take 1 tablet (325 mg total) by mouth daily.     cloNIDine 0.2 MG tablet  Commonly known as:  CATAPRES  Take 1 tablet (0.2 mg total) by mouth 2 (two) times daily.     dextromethorphan-guaiFENesin 30-600 MG 12hr tablet  Commonly known as:  MUCINEX DM  Take 1 tablet by mouth 2 (two) times daily.     docusate sodium 100 MG capsule  Commonly known as:  COLACE  Take 2 capsules (200 mg total) by mouth daily.     feeding supplement Liqd  Take 1 Container by mouth 3 (three) times daily with meals.     oxyCODONE 5 MG immediate release tablet  Commonly known as:  Oxy IR/ROXICODONE  Take 1 tablet (5 mg total) by mouth every 3 (three) hours as needed for severe pain.     polyethylene glycol packet  Commonly known as:  MIRALAX / GLYCOLAX  Take 17 g by mouth daily as needed.     thiamine 100 MG tablet  Take 1 tablet (100 mg total) by mouth daily.       Follow-up Information    Follow up with Xu,Jindong, MD. Schedule an appointment as soon as possible for a visit in 2 months.   Specialty:  Neurology   Why:  stroke clinic   Contact information:   [address] [place] [address] [phone]       Follow up with [facility].   Why:  rolling walker, 3:1   Contact information:   [address] [place] [address] [phone]       Follow up with Mikey Bussing, MD.   Specialty:  Cardiothoracic Surgery   Why:  11/24/2015 at 1 PM to see the  surgeon. Please obtain a chest x-ray at [facility] imaging at 12:30 PM. [facility] imaging is located in the same office complex.   Contact information:   [address] [employer] [address] [place] [address] [phone]       Signed: Rowe Clack 11/05/2015, 12:46 PM

## 2015-11-04 NOTE — Progress Notes (Signed)
CSW received consult regarding substance use. CSW spoke with patient regarding alcohol use. Patient reported that he does drink alcohol but cannot tell me how much he drinks or when. Patient stated alcohol is not a problem for him and he does not need any substance use resources. 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 CSW consulted RNCM regarding patient's eye health concerns.   CSW signing off.  [name] LCSWA [phone]

## 2015-11-04 NOTE — Progress Notes (Signed)
Pacing wires were not pulled due to after time and patient receiving Lovenox. Barret PA and Donata Clay made aware. Patient wires should be pulled 2/24 and no VTE meds given until an hour later. [name] 6:13 PM

## 2015-11-04 NOTE — Discharge Instructions (Signed)
Repair of aortic dissection Refer to this sheet in the next few weeks. These instructions provide you with information on caring for yourself after your procedure. Your health care provider may also give you specific instructions. Your treatment has been planned according to current medical practices, but problems sometimes occur. Call your health care provider if you have any problems or questions after your procedure. HOME CARE INSTRUCTIONS   Take medicines only as directed by your health care provider.  If your health care provider has prescribed elastic stockings, wear them as directed.  Take frequent naps or rest often throughout the day.  Avoid lifting over 10 lbs (4.5 kg) or pushing or pulling things with your arms for 6-8 weeks or as directed by your health care provider.  Avoid driving or airplane travel for 4-6 weeks after surgery or as directed by your health care provider. If you are riding in a car for an extended period, stop every 1-2 hours to stretch your legs. Keep a record of your medicines and medical history with you when traveling.  Do not drive or operate heavy machinery while taking pain medicine. (narcotics).  Do not cross your legs.  Do not use any tobacco products including cigarettes, chewing tobacco, or electronic cigarettes. If you need help quitting, ask your health care provider.  Do not take baths, swim, or use a hot tub until your health care provider approves. Take showers once your health care provider approves. Pat incisions dry. Do not rub incisions with a washcloth or towel.  Avoid climbing stairs and using the handrail to pull yourself up for the first 2-3 weeks after surgery.  Return to work as directed by your health care provider.  Drink enough fluid to keep your urine clear or pale yellow.  Do not strain to have a bowel movement. Eat high-fiber foods if you become constipated. You may also take a medicine to help you have a bowel movement  (laxative) as directed by your health care provider.  Resume sexual activity as directed by your health care provider. Men should not use medicines for erectile dysfunction until their doctor says it isokay.  If you had a certain type of heart condition in the past, you may need to take antibiotic medicine before having dental work or surgery. Let your dentist and health care providers know if you had one or more of the following:  Previous endocarditis.  An artificial (prosthetic) heart valve.  Congenital heart disease. SEEK MEDICAL CARE IF:  You develop a skin rash.   You experience sudden changes in your weight.  You have a fever. SEEK IMMEDIATE MEDICAL CARE IF:   You develop chest pain that is not coming from your incision.  You have drainage (pus), redness, swelling, or pain at your incision site.   You develop shortness of breath or have difficulty breathing.   You have increased bleeding from your incision site.   You develop light-headedness.  MAKE SURE YOU:   Understand these directions.  Will watch your condition.  Will get help right away if you are not doing well or get worse.    Do not use illegal drugs such as cocaine   This information is not intended to replace advice given to you by your health care provider. Make sure you discuss any questions you have with your health care provider.   Document Released: 03/16/2005 Document Revised: 09/18/2014 Document Reviewed: 06/11/2012 Elsevier Interactive Patient Education [employer]

## 2015-11-04 NOTE — Progress Notes (Signed)
Patient lying in bed, pain meds given. Call light within reach 

## 2015-11-04 NOTE — Progress Notes (Addendum)
      [address]       [place] [address]             [phone]      7 Days Post-Op Procedure(s) (LRB): TYPE A AORTIC DISSECTION REPAIR WITH CIRC ARREST (N/A) TRANSESOPHAGEAL ECHOCARDIOGRAM (TEE) (N/A)   Subjective:  Mr. Greulich has no complaints this morning.  He states he is doing all right.  He is agreeable to CIR, however he does not feel that his drug issue was addressed in an appropriate manner.  He feels like this was done behind his back and he does not know if he is willing to seek treatment.  Objective: Vital signs in last 24 hours: Temp:  [97.4 F (36.3 C)-98.5 F (36.9 C)] 98.2 F (36.8 C) (02/23 0611) Pulse Rate:  [92-98] 92 (02/23 0611) Cardiac Rhythm:  [-] Normal sinus rhythm (02/23 0719) Resp:  [15-23] 18 (02/23 0611) BP: (109-134)/(65-79) 127/79 mmHg (02/23 0611) SpO2:  [88 %-100 %] 88 % (02/23 0746) Weight:  [243 lb 2.7 oz (110.3 kg)] 243 lb 2.7 oz (110.3 kg) (02/23 0611)  Intake/Output from previous day: 02/22 0701 - 02/23 0700 In: 1110 [P.O.:960; IV Piggyback:150] Out: 3300 [Urine:3300] Intake/Output this shift: Total I/O In: 20 [I.V.:20] Out: 200 [Urine:200]  General appearance: alert, cooperative and no distress Heart: regular rate and rhythm Lungs: clear to auscultation bilaterally Abdomen: soft, non-tender; bowel sounds normal; no masses,  no organomegaly Extremities: edema trace Wound: clean and dry  Lab Results:  Recent Labs  11/02/15 0427 11/02/15 1515 11/03/15 0507  WBC 11.0*  --  10.4  HGB 10.1* 12.6* 10.3*  HCT 32.3* 37.0* 32.2*  PLT 310  --  398   BMET:  Recent Labs  11/02/15 0427 11/02/15 1515 11/03/15 0507  NA 138 135 133*  K 3.7 3.5 3.3*  CL 98* 91* 93*  CO2 30  --  31  GLUCOSE 105* 101* 102*  BUN CREATININE 0.83 0.90 0.85  CALCIUM 8.1*  --  8.2*    PT/INR: No results for input(s): LABPROT, INR in the last 72 hours. ABG    Component Value Date/Time   PHART 7.457* 10/30/2015 1725    HCO3 21.7 10/30/2015 1725   TCO2 31 11/02/2015 1515   ACIDBASEDEF 2.0 10/30/2015 1725   O2SAT 52.0 10/31/2015 0350   CBG (last 3)   Recent Labs  11/03/15 0050 11/03/15 0349 11/03/15 0848  GLUCAP 95 85 94    Assessment/Plan: S/P Procedure(s) (LRB): TYPE A AORTIC DISSECTION REPAIR WITH CIRC ARREST (N/A) TRANSESOPHAGEAL ECHOCARDIOGRAM (TEE) (N/A)  1. CV- NSR rate and pressure controlled- on Amiodarone, will start low dose BB 2. Pulm- no acute issues, continue IS 3. Renal- mildly hypervolemic, currently on Lasix 4. H/O Cocaine abuse- appreciate psychiatry input, however per patient this morning, currently does not know if he is willing to seek treatment 5. Neuro- Preop CVA, residual vision defect 6. Deconditioning- continue PT/OT, CIR at discharge 7. Dispo- patient stable, maintaining NSR will start low dose BB, to CIR when medically stable   LOS: 10 days    BARRETT, ERIN 11/04/2015  Clonidine  po bid for BP control He lives with 59 yo mother and wouldnot be safe with her now CIR eval requested Needs more potassium  patient examined and medical record reviewed,agree with above note. Kathlee Nations Trigt III 11/04/2015

## 2015-11-04 NOTE — Progress Notes (Signed)
CARDIAC REHAB PHASE I   PRE:  Rate/Rhythm: 86 SR  BP:  Sitting: 116/75        SaO2: 91 RA  MODE:  Ambulation: 410 ft   POST:  Rate/Rhythm: 101 ST  BP:  Sitting: 129/74         SaO2: 93 RA  Pt ambulated 410 ft on RA, rolling walker, assist x1, mildly unsteady gait, tolerated fairly well. Pt c/o fatigue, mild DOE, denies any other complaints, standing rest x4. Pt states he thinks he went too far today with ambulation. VSS. Encouraged IS, ambulation, pt verbalized understanding. Pt to recliner after walk, call bell within reach, nurse tech at bedside. Will follow.   [phone] Joylene Grapes, RN, BSN 11/04/2015 11:22 AM

## 2015-11-05 ENCOUNTER — Inpatient Hospital Stay (HOSPITAL_COMMUNITY)
Admission: RE | Admit: 2015-11-05 | Discharge: 2015-11-13 | DRG: 057 | Disposition: A | Discharge status: Home Health | Payer: Medicaid Other | Source: Intra-hospital | Attending: Physical Medicine & Rehabilitation | Admitting: Physical Medicine & Rehabilitation

## 2015-11-05 ENCOUNTER — Inpatient Hospital Stay (HOSPITAL_COMMUNITY): Payer: Medicaid Other

## 2015-11-05 DIAGNOSIS — D62 Acute posthemorrhagic anemia: Secondary | ICD-10-CM | POA: Diagnosis present

## 2015-11-05 DIAGNOSIS — F17210 Nicotine dependence, cigarettes, uncomplicated: Secondary | ICD-10-CM | POA: Diagnosis present

## 2015-11-05 DIAGNOSIS — I10 Essential (primary) hypertension: Secondary | ICD-10-CM | POA: Diagnosis not present

## 2015-11-05 DIAGNOSIS — F419 Anxiety disorder, unspecified: Secondary | ICD-10-CM | POA: Diagnosis present

## 2015-11-05 DIAGNOSIS — I69354 Hemiplegia and hemiparesis following cerebral infarction affecting left non-dominant side: Principal | ICD-10-CM

## 2015-11-05 DIAGNOSIS — M1611 Unilateral primary osteoarthritis, right hip: Secondary | ICD-10-CM | POA: Diagnosis present

## 2015-11-05 DIAGNOSIS — H532 Diplopia: Secondary | ICD-10-CM | POA: Diagnosis present

## 2015-11-05 DIAGNOSIS — K5900 Constipation, unspecified: Secondary | ICD-10-CM | POA: Diagnosis present

## 2015-11-05 DIAGNOSIS — I7101 Dissection of thoracic aorta: Secondary | ICD-10-CM | POA: Diagnosis not present

## 2015-11-05 DIAGNOSIS — I63531 Cerebral infarction due to unspecified occlusion or stenosis of right posterior cerebral artery: Secondary | ICD-10-CM | POA: Diagnosis not present

## 2015-11-05 DIAGNOSIS — I69398 Other sequelae of cerebral infarction: Secondary | ICD-10-CM | POA: Diagnosis present

## 2015-11-05 DIAGNOSIS — G8929 Other chronic pain: Secondary | ICD-10-CM | POA: Diagnosis present

## 2015-11-05 DIAGNOSIS — Z8673 Personal history of transient ischemic attack (TIA), and cerebral infarction without residual deficits: Secondary | ICD-10-CM | POA: Diagnosis present

## 2015-11-05 DIAGNOSIS — R2689 Other abnormalities of gait and mobility: Secondary | ICD-10-CM | POA: Diagnosis present

## 2015-11-05 DIAGNOSIS — I4891 Unspecified atrial fibrillation: Secondary | ICD-10-CM | POA: Diagnosis present

## 2015-11-05 DIAGNOSIS — I71019 Dissection of thoracic aorta, unspecified: Secondary | ICD-10-CM

## 2015-11-05 DIAGNOSIS — F1410 Cocaine abuse, uncomplicated: Secondary | ICD-10-CM

## 2015-11-05 DIAGNOSIS — H53462 Homonymous bilateral field defects, left side: Secondary | ICD-10-CM | POA: Diagnosis not present

## 2015-11-05 LAB — CREATININE, SERUM: CREATININE: 0.92 mg/dL (ref 0.61–1.24)

## 2015-11-05 LAB — CBC
HEMATOCRIT: 32.5 % — AB (ref 39.0–52.0)
Hemoglobin: 10.3 g/dL — ABNORMAL LOW (ref 13.0–17.0)
MCH: 24.8 pg — ABNORMAL LOW (ref 26.0–34.0)
MCHC: 31.7 g/dL (ref 30.0–36.0)
MCV: 78.3 fL (ref 78.0–100.0)
PLATELETS: 558 10*3/uL — AB (ref 150–400)
RBC: 4.15 MIL/uL — ABNORMAL LOW (ref 4.22–5.81)
RDW: 13.7 % (ref 11.5–15.5)
WBC: 14.6 10*3/uL — AB (ref 4.0–10.5)

## 2015-11-05 MED ORDER — ONDANSETRON HCL 4 MG/2ML IJ SOLN: 4.0000 mg | Freq: Four times a day (QID) | INTRAMUSCULAR | Status: DC | PRN

## 2015-11-05 MED ORDER — FUROSEMIDE 40 MG PO TABS
40.0000 mg | ORAL_TABLET | Freq: Every day | ORAL | Status: DC
  Administered 2015-11-06 – 2015-11-10 (×5): 40 mg via ORAL
  Filled 2015-11-05 (×5): qty 1

## 2015-11-05 MED ORDER — ASPIRIN EC 325 MG PO TBEC
325.0000 mg | DELAYED_RELEASE_TABLET | Freq: Every day | ORAL | Status: DC
  Administered 2015-11-06 – 2015-11-13 (×8): 325 mg via ORAL
  Filled 2015-11-05 (×8): qty 1

## 2015-11-05 MED ORDER — AMIODARONE HCL 200 MG PO TABS
200.0000 mg | ORAL_TABLET | Freq: Two times a day (BID) | ORAL | Status: DC
  Administered 2015-11-05 – 2015-11-13 (×16): 200 mg via ORAL
  Filled 2015-11-05 (×16): qty 1

## 2015-11-05 MED ORDER — CETYLPYRIDINIUM CHLORIDE 0.05 % MT LIQD: 7.0000 mL | Freq: Two times a day (BID) | OROMUCOSAL | Status: DC

## 2015-11-05 MED ORDER — AMIODARONE HCL 200 MG PO TABS: 200.0000 mg | ORAL_TABLET | Freq: Two times a day (BID) | ORAL | Status: DC

## 2015-11-05 MED ORDER — LEVALBUTEROL HCL 1.25 MG/0.5ML IN NEBU
1.2500 mg | INHALATION_SOLUTION | Freq: Four times a day (QID) | RESPIRATORY_TRACT | Status: DC | PRN
  Filled 2015-11-05: qty 0.5

## 2015-11-05 MED ORDER — POLYETHYLENE GLYCOL 3350 17 G PO PACK: 17.0000 g | PACK | Freq: Every day | ORAL | Status: DC | PRN

## 2015-11-05 MED ORDER — ALPRAZOLAM 0.25 MG PO TABS: 0.2500 mg | ORAL_TABLET | Freq: Two times a day (BID) | ORAL | Status: DC | PRN

## 2015-11-05 MED ORDER — ALPRAZOLAM 0.25 MG PO TABS
0.2500 mg | ORAL_TABLET | Freq: Two times a day (BID) | ORAL | Status: DC | PRN
  Administered 2015-11-08: 0.25 mg via ORAL
  Filled 2015-11-05: qty 1

## 2015-11-05 MED ORDER — ENOXAPARIN SODIUM 40 MG/0.4ML ~~LOC~~ SOLN
40.0000 mg | SUBCUTANEOUS | Status: DC
  Administered 2015-11-06 – 2015-11-10 (×5): 40 mg via SUBCUTANEOUS
  Filled 2015-11-05 (×5): qty 0.4

## 2015-11-05 MED ORDER — VITAMIN B-1 100 MG PO TABS
100.0000 mg | ORAL_TABLET | Freq: Every day | ORAL | Status: DC
  Administered 2015-11-06 – 2015-11-13 (×8): 100 mg via ORAL
  Filled 2015-11-05 (×8): qty 1

## 2015-11-05 MED ORDER — DOCUSATE SODIUM 100 MG PO CAPS: 200.0000 mg | ORAL_CAPSULE | Freq: Every day | ORAL | Status: DC

## 2015-11-05 MED ORDER — POLYETHYLENE GLYCOL 3350 17 G PO PACK
17.0000 g | PACK | Freq: Every day | ORAL | Status: DC
  Administered 2015-11-06 – 2015-11-13 (×8): 17 g via ORAL
  Filled 2015-11-05 (×8): qty 1

## 2015-11-05 MED ORDER — ASPIRIN 81 MG PO CHEW
324.0000 mg | CHEWABLE_TABLET | Freq: Every day | ORAL | Status: DC
  Filled 2015-11-05: qty 4

## 2015-11-05 MED ORDER — BUDESONIDE-FORMOTEROL FUMARATE 160-4.5 MCG/ACT IN AERO
2.0000 | INHALATION_SPRAY | Freq: Two times a day (BID) | RESPIRATORY_TRACT | Status: DC
  Administered 2015-11-05: 2 via RESPIRATORY_TRACT
  Filled 2015-11-05: qty 6

## 2015-11-05 MED ORDER — POTASSIUM CHLORIDE CRYS ER 20 MEQ PO TBCR
20.0000 meq | EXTENDED_RELEASE_TABLET | Freq: Two times a day (BID) | ORAL | Status: DC
  Administered 2015-11-05 – 2015-11-13 (×16): 20 meq via ORAL
  Filled 2015-11-05 (×17): qty 1

## 2015-11-05 MED ORDER — ONDANSETRON HCL 4 MG PO TABS: 4.0000 mg | ORAL_TABLET | Freq: Four times a day (QID) | ORAL | Status: DC | PRN

## 2015-11-05 MED ORDER — ASPIRIN 325 MG PO TBEC: 325.0000 mg | DELAYED_RELEASE_TABLET | Freq: Every day | ORAL | Status: DC

## 2015-11-05 MED ORDER — DOCUSATE SODIUM 100 MG PO CAPS
200.0000 mg | ORAL_CAPSULE | Freq: Every day | ORAL | Status: DC
  Administered 2015-11-06 – 2015-11-13 (×8): 200 mg via ORAL
  Filled 2015-11-05 (×9): qty 2

## 2015-11-05 MED ORDER — BOOST / RESOURCE BREEZE PO LIQD: 237.0000 mL | Freq: Three times a day (TID) | ORAL | Status: DC

## 2015-11-05 MED ORDER — OXYCODONE HCL 5 MG PO TABS
5.0000 mg | ORAL_TABLET | ORAL | Status: DC | PRN
  Administered 2015-11-05 – 2015-11-12 (×29): 5 mg via ORAL
  Filled 2015-11-05 (×30): qty 1

## 2015-11-05 MED ORDER — PANTOPRAZOLE SODIUM 40 MG PO TBEC
40.0000 mg | DELAYED_RELEASE_TABLET | Freq: Every day | ORAL | Status: DC
  Administered 2015-11-06 – 2015-11-13 (×8): 40 mg via ORAL
  Filled 2015-11-05 (×8): qty 1

## 2015-11-05 MED ORDER — THIAMINE HCL 100 MG PO TABS: 100.0000 mg | ORAL_TABLET | Freq: Every day | ORAL | Status: DC

## 2015-11-05 MED ORDER — CLONIDINE HCL 0.2 MG PO TABS
0.2000 mg | ORAL_TABLET | Freq: Two times a day (BID) | ORAL | Status: DC
  Administered 2015-11-05 – 2015-11-13 (×16): 0.2 mg via ORAL
  Filled 2015-11-05 (×16): qty 1

## 2015-11-05 MED ORDER — OXYCODONE HCL 5 MG PO TABS: 5.0000 mg | ORAL_TABLET | ORAL | Status: DC | PRN

## 2015-11-05 MED ORDER — ENOXAPARIN SODIUM 40 MG/0.4ML ~~LOC~~ SOLN: 40.0000 mg | SUBCUTANEOUS | Status: DC

## 2015-11-05 MED ORDER — VITAMIN B-1 100 MG PO TABS
100.0000 mg | ORAL_TABLET | Freq: Every day | ORAL | Status: DC
  Administered 2015-11-05: 100 mg via ORAL
  Filled 2015-11-05: qty 1

## 2015-11-05 MED ORDER — DM-GUAIFENESIN ER 30-600 MG PO TB12: 1.0000 | ORAL_TABLET | Freq: Two times a day (BID) | ORAL | Status: DC

## 2015-11-05 MED ORDER — SORBITOL 70 % SOLN
30.0000 mL | Freq: Every day | Status: DC | PRN
  Administered 2015-11-06 – 2015-11-12 (×3): 30 mL via ORAL
  Filled 2015-11-05 (×4): qty 30

## 2015-11-05 MED ORDER — CLONIDINE HCL 0.2 MG PO TABS
0.2000 mg | ORAL_TABLET | Freq: Two times a day (BID) | ORAL | Status: DC
Start: 2015-11-05 — End: 2015-11-12

## 2015-11-05 NOTE — PMR Pre-admission (Signed)
PMR Admission Coordinator Pre-Admission Assessment  Patient: [name] is an 55 y.o., male MRN: [id number] DOB: 01-23-1961 Height:  (170.2 cm) Weight: 109.135 kg (240 lb 9.6 oz)              Insurance Information Self pay - no insurance  Medicaid Application Date:        Case Manager:   Disability Application Date:        Case Worker:    Emergency Conservator, museum/gallery Information    Name Relation Home Work Mobile   [place] Mother [phone]       Current Medical History  Patient Admitting Diagnosis: Right PCA infarct, s/p aortic dissection repair    History of Present Illness: A 55 y.o. right handed male with history of hypertension , tobacco and polysubstance abuse, poor medical compliance as well as known history of type B descending thoracic aortic dissection diagnosed April 2016. Patient lives with his mother independent prior to admission. Mother can assist. One level home with ramped entrance. Presented 10/25/2015 with left hemianopsia headache and some right shoulder discomfort. CT of the head showed hypodense cortical lesion with mild edema and mass effect in the medial right occipital lobe. CTA of head and neck showed extensive dissection of thoracic aorta. No flow demonstrated in the proximal right vertebral artery. No intracranial large or medium vessel occlusion demonstrated. Echocardiogram with ejection fraction of 65% grade 1 diastolic dysfunction. Carotid Dopplers with no ICA stenosis. Urine drug screen positive cocaine. Patient did not receive TPA. Cardiothoracic surgery consulted underwent repair of ascending aortic dissection with replacement of the ascending aorta and reconstruction as well as placement of intra-aortic balloon pump 10/28/2015 per Dr. Donata Clay. Hospital course pain management. Maintained on aspirin for CVA prophylaxis. Subcutaneous Lovenox for DVT prophylaxis. Tolerating a regular diet. Acute blood loss anemia 10.3 and monitored.  Psychiatry services consulted for chronic substance abuse recommends outpatient substance abuse counseling. Physical occupational therapy evaluations completed and ongoing with recommendations of physical medicine rehabilitation consult.    Total: 2=NIH  Past Medical History  Past Medical History  Diagnosis Date  . Malignant hypertension Dx 2016  . Aortic dissection, thoracic (HCC)     Family History  family history includes Dementia in his father; Diabetes in his father; Heart disease in his father.  Prior Rehab/Hospitalizations: No previous rehab admissions.  Has the patient had major surgery during 100 days prior to admission? No  Current Medications   Current facility-administered medications:  .  0.9 %  sodium chloride infusion, 250 mL, Intravenous, Continuous, Wayne E Gold, PA-C .  ALPRAZolam Prudy Feeler) tablet 0.25 mg, 0.25 mg, Oral, BID PRN, Kerin Perna, MD, 0.25 mg at 11/02/15 2107 .  amiodarone (PACERONE) tablet 200 mg, 200 mg, Oral, BID, Kerin Perna, MD, 200 mg at 11/05/15 1021 .  antiseptic oral rinse (CPC / CETYLPYRIDINIUM CHLORIDE 0.05%) solution 7 mL, 7 mL, Mouth Rinse, BID, Kerin Perna, MD, 7 mL at 11/05/15 1000 .  aspirin EC tablet 325 mg, 325 mg, Oral, Daily, 325 mg at 11/05/15 1021 **OR** aspirin chewable tablet 324 mg, 324 mg, Per Tube, Daily, Wayne E Gold, PA-C, 324 mg at 10/30/15 0950 .  bisacodyl (DULCOLAX) EC tablet 10 mg, 10 mg, Oral, Daily, 10 mg at 11/05/15 1020 **OR** bisacodyl (DULCOLAX) suppository 10 mg, 10 mg, Rectal, Daily, Wayne E Gold, PA-C, 10 mg at 10/29/15 1600 .  budesonide-formoterol (SYMBICORT) 160-4.5 MCG/ACT inhaler 2 puff, 2 puff, Inhalation, BID, Kerin Perna, MD, 2  puff at 11/05/15 0907 .  cloNIDine (CATAPRES) tablet 0.2 mg, 0.2 mg, Oral, BID, Kerin Perna, MD, 0.2 mg at 11/05/15 1021 .  dextromethorphan-guaiFENesin (MUCINEX DM) 30-600 MG per 12 hr tablet 1 tablet, 1 tablet, Oral, BID, Kerin Perna, MD, 1 tablet at 11/05/15  1020 .  docusate sodium (COLACE) capsule 200 mg, 200 mg, Oral, Daily, Wayne E Gold, PA-C, 200 mg at 11/05/15 1020 .  enoxaparin (LOVENOX) injection 40 mg, 40 mg, Subcutaneous, Q24H, Kerin Perna, MD, 40 mg at 11/05/15 1022 .  feeding supplement (BOOST / RESOURCE BREEZE) liquid 1 Container, 237 mL, Oral, TID WC, Kerin Perna, MD, 1 Container at 11/05/15 1230 .  furosemide (LASIX) tablet 40 mg, 40 mg, Oral, Daily, Kerin Perna, MD, 40 mg at 11/05/15 1022 .  levalbuterol (XOPENEX) nebulizer solution 1.25 mg, 1.25 mg, Nebulization, Q6H PRN, Kerin Perna, MD .  ondansetron [facility]) injection 4 mg, 4 mg, Intravenous, Q6H PRN, Wayne E Gold, PA-C .  oxyCODONE (Oxy IR/ROXICODONE) immediate release tablet 5 mg, 5 mg, Oral, Q3H PRN, Kerin Perna, MD, 5 mg at 11/05/15 1436 .  pantoprazole (PROTONIX) EC tablet 40 mg, 40 mg, Oral, Daily, Wayne E Gold, PA-C, 40 mg at 11/05/15 1021 .  polyethylene glycol (MIRALAX / GLYCOLAX) packet 17 g, 17 g, Oral, Daily, Erin R Barrett, PA-C, 17 g at 11/05/15 0815 .  potassium chloride SA (K-DUR,KLOR-CON) CR tablet 20 mEq, 20 mEq, Oral, BID, Kerin Perna, MD, 20 mEq at 11/05/15 1022 .  sodium chloride flush (NS) 0.9 % injection 10-40 mL, 10-40 mL, Intracatheter, Q12H, Kerin Perna, MD, 10 mL at 11/04/15 1036 .  sodium chloride flush (NS) 0.9 % injection 10-40 mL, 10-40 mL, Intracatheter, PRN, Kerin Perna, MD, 10 mL at 11/05/15 [phone] .  thiamine (VITAMIN B-1) tablet 100 mg, 100 mg, Oral, Daily, Lynita Lombard [place], RPH, 100 mg at 11/05/15 1021 .  traMADol (ULTRAM) tablet 50-100 mg, 50-100 mg, Oral, Q4H PRN, Wayne E Gold, PA-C, 100 mg at 11/01/15 9604  Patients Current Diet: Diet heart healthy/carb modified Room service appropriate?: Yes; Fluid consistency:: Thin  Precautions / Restrictions Precautions Precautions: Fall, Sternal Precaution Comments: Reviewed sternal precautions.   Restrictions Weight Bearing Restrictions: Yes (sternal precautions) Other  Position/Activity Restrictions: Sternal precautions   Has the patient had 2 or more falls or a fall with injury in the past year?Yes.  Has had at least 3 falls with bruising.  Prior Activity Level Community (5-7x/wk): Went out daily.  Worked as an Personnel officer.  Home Assistive Devices / Equipment Home Assistive Devices/Equipment: None Home Equipment: Walker - 4 wheels  Prior Device Use: Indicate devices/aids used by the patient prior to current illness, exacerbation or injury? None  Prior Functional Level Prior Function Level of Independence: Independent Comments: Patient works as Barista Care: Did the patient need help bathing, dressing, using the toilet or eating?  Independent  Indoor Mobility: Did the patient need assistance with walking from room to room (with or without device)? Independent  Stairs: Did the patient need assistance with internal or external stairs (with or without device)? Independent  Functional Cognition: Did the patient need help planning regular tasks such as shopping or remembering to take medications? Independent  Current Functional Level Cognition  Overall Cognitive Status: Impaired/Different from baseline Orientation Level: Oriented X4 Safety/Judgement: Decreased awareness of safety    Extremity Assessment (includes Sensation/Coordination)  Upper Extremity Assessment: Overall WFL for tasks assessed (grossly)  Lower Extremity Assessment:  Defer to PT evaluation    ADLs  Overall ADL's : Needs assistance/impaired Eating/Feeding: Independent, Sitting Eating/Feeding Details (indicate cue type and reason): pt turned plate to help him see all of the contents Grooming: Wash/dry hands, Minimal assistance, Standing Grooming Details (indicate cue type and reason): assist to locate soap and towels on L side, cues for aligning walker with sink Upper Body Bathing: Sitting, Minimal assitance Lower Body Bathing: Minimal assistance, Sit to/from  stand Lower Body Bathing Details (indicate cue type and reason): unable to reach R foot Upper Body Dressing : Set up, Sitting Upper Body Dressing Details (indicate cue type and reason): changed soiled gown Lower Body Dressing: Minimal assistance, Sit to/from stand Lower Body Dressing Details (indicate cue type and reason): unable to reach R foot, can cross L foot over opposite knee to don and doff sock Toilet Transfer: Minimal assistance, Ambulation, RW Toileting- Clothing Manipulation and Hygiene: Minimal assistance, Sit to/from stand Toileting - Clothing Manipulation Details (indicate cue type and reason): LOB with pericare Functional mobility during ADLs: Rolling walker, Moderate assistance    Mobility  Overal bed mobility: Needs Assistance Bed Mobility: Sit to Sidelying Rolling: Min assist Sidelying to sit: Max assist Supine to sit: Supervision, HOB elevated Sit to supine: Max assist, +2 for physical assistance Sit to sidelying: Supervision General bed mobility comments: cues for technique    Transfers  Overall transfer level: Needs assistance Equipment used: Rolling walker (2 wheeled) Transfers: Sit to/from Stand Sit to Stand: Min assist Stand pivot transfers: Min assist, +2 physical assistance General transfer comment: increased time, cues to use momentum for decreased UE support    Ambulation / Gait / Stairs / Wheelchair Mobility  Ambulation/Gait Ambulation/Gait assistance: Mod assist Ambulation Distance (Feet): 150 Feet (x 2) Assistive device: Rolling walker (2 wheeled) Gait Pattern/deviations: Step-to pattern, Shuffle General Gait Details: initially keeping R pelvis retracted, facilitation provided for R pelvic protraction, cues and assist for attending to obstacles on L side Gait velocity: decreased Gait velocity interpretation: Below normal speed for age/gender    Posture / Balance Balance Overall balance assessment: Needs assistance Sitting-balance support: Feet  supported Sitting balance-Leahy Scale: Fair Standing balance support: During functional activity Standing balance-Leahy Scale: Poor Standing balance comment: UE support for balance due to weakness    Special needs/care consideration BiPAP/CPAP No CPM No Continuous Drip IV No Dialysis No        Life Vest No Oxygen No Special Bed No Trach Size No Wound Vac (area) No       Skin NO                               Bowel mgmt: Last BM 11/04/15 Bladder mgmt: Voiding in the urinal Diabetic mgmt No    Previous Home Environment Living Arrangements: Parent Available Help at Discharge: Family, Available PRN/intermittently Type of Home: House Home Layout: One level Home Access: Stairs to enter, Ramped entrance Entrance Stairs-Rails: Right, Left Entrance Stairs-Number of Steps: 4 Bathroom Shower/Tub: Engineer, manufacturing systems: Handicapped height Home Care Services: No  Discharge Living Setting Plans for Discharge Living Setting: House, Lives with (comment) (Lives with mother.) Type of Home at Discharge: House Discharge Home Layout: One level Discharge Home Access: Stairs to enter, Level entry Entrance Stairs-Number of Steps: 4 steps at front and ramp at the back entry Does the patient have any problems obtaining your medications?: No  Social/Family/Support Systems Patient Roles: Other (Comment) (Has a mom.)  Has a son who lives in [place]. Contact Information: Fabienne Bruns - mother Anticipated Caregiver: mom Anticipated Caregiver's Contact Information: Stefanie Libel - [phone] Ability/Limitations of Caregiver: Mom can assist. Caregiver Availability: Intermittent Discharge Plan Discussed with Primary Caregiver: Yes Is Caregiver In Agreement with Plan?: Yes  Goals/Additional Needs Patient/Family Goal for Rehab: PT/OT mod I goals Expected length of stay: 7-10 days Cultural Considerations: None Dietary Needs: Heart healthy, carb modified, thin liquids Equipment Needs:  TBD Pt/Family Agrees to Admission and willing to participate: Yes Program Orientation Provided & Reviewed with Pt/Caregiver Including Roles  & Responsibilities: Yes  Decrease burden of Care through IP rehab admission:  N/A  Possible need for SNF placement upon discharge: No  Patient Condition: This patient's condition remains as documented in the consult dated 11/05/15, in which the Rehabilitation Physician determined and documented that the patient's condition is appropriate for intensive rehabilitative care in an inpatient rehabilitation facility. Will admit to inpatient rehab today.  Preadmission Screen Completed By:  Trish Mage, 11/05/2015 3:03 PM ______________________________________________________________________   Discussed status with Dr.  Riley Kill on 11/05/15 at 1502 and received telephone approval for admission today.  Admission Coordinator:  Trish Mage, time1502/Date02/24/17

## 2015-11-05 NOTE — Progress Notes (Signed)
Per IV team, recommend removing PICC prior to transfer to CIR.  Call placed to PA, agreed.  Order entered.  IV team to remove PICC line.

## 2015-11-05 NOTE — Progress Notes (Signed)
Report given to nurse on 4W.  PICC line to be removed and Pt to have 30 minutes bedrest prior to transporting.  Informational handout given on aortic dissection, stroke prevention and ischemic stroke.  Will cont to monitor.

## 2015-11-05 NOTE — Progress Notes (Addendum)
8 Days Post-Op Procedure(s) (LRB): TYPE A AORTIC DISSECTION REPAIR WITH CIRC ARREST (N/A) TRANSESOPHAGEAL ECHOCARDIOGRAM (TEE) (N/A) Subjective:  concerned about vision, no other c/o  Objective: Vital signs in last 24 hours: Temp:  [97.8 F (36.6 C)-98.2 F (36.8 C)] 98.1 F (36.7 C) (02/24 0506) Pulse Rate:  [93-97] 94 (02/24 0506) Cardiac Rhythm:  [-] Normal sinus rhythm;Bundle branch block (02/23 1918) Resp:  [20] 20 (02/24 0506) BP: (117-119)/(68-74) 118/68 mmHg (02/24 0506) SpO2:  [88 %-96 %] 95 % (02/24 0506) Weight:  [240 lb 9.6 oz (109.135 kg)] 240 lb 9.6 oz (109.135 kg) (02/24 0506)  Hemodynamic parameters for last 24 hours:    Intake/Output from previous day: 02/23 0701 - 02/24 0700 In: 860 [P.O.:840; I.V.:20] Out: 1501 [Urine:1500; Stool:1] Intake/Output this shift:    General appearance: alert, cooperative and no distress Heart: regular rate and rhythm Lungs: dim in lower fields Abdomen: benign Extremities: no edema Wound: incis healing well  Lab Results:  Recent Labs  11/02/15 1515 11/03/15 0507  WBC  --  10.4  HGB 12.6* 10.3*  HCT 37.0* 32.2*  PLT  --  398   BMET:  Recent Labs  11/02/15 1515 11/03/15 0507  NA 135 133*  K 3.5 3.3*  CL 91* 93*  CO2  --  31  GLUCOSE 101* 102*  BUN 18 13  CREATININE 0.90 0.85  CALCIUM  --  8.2*    PT/INR: No results for input(s): LABPROT, INR in the last 72 hours. ABG    Component Value Date/Time   PHART 7.457* 10/30/2015 1725   HCO3 21.7 10/30/2015 1725   TCO2 31 11/02/2015 1515   ACIDBASEDEF 2.0 10/30/2015 1725   O2SAT 52.0 10/31/2015 0350   CBG (last 3)   Recent Labs  11/03/15 0050 11/03/15 0349 11/03/15 0848  GLUCAP 95 85 94    Meds Scheduled Meds: . amiodarone  200 mg Oral BID  . antiseptic oral rinse  7 mL Mouth Rinse BID  . aspirin EC  325 mg Oral Daily   Or  . aspirin  324 mg Per Tube Daily  . bisacodyl  10 mg Oral Daily   Or  . bisacodyl  10 mg Rectal Daily  .  budesonide-formoterol  2 puff Inhalation BID  . cloNIDine  0.2 mg Oral BID  . dextromethorphan-guaiFENesin  1 tablet Oral BID  . docusate sodium  200 mg Oral Daily  . enoxaparin (LOVENOX) injection  40 mg Subcutaneous Q24H  . feeding supplement  237 mL Oral TID WC  . furosemide  40 mg Oral Daily  . pantoprazole  40 mg Oral Daily  . polyethylene glycol  17 g Oral Daily  . potassium chloride  20 mEq Oral BID  . sodium chloride flush  10-40 mL Intracatheter Q12H  . thiamine  100 mg Intravenous Daily   Continuous Infusions: . sodium chloride     PRN Meds:.ALPRAZolam, levalbuterol, ondansetron (ZOFRAN) IV, oxyCODONE, sodium chloride flush, traMADol  Xrays No results found.  Assessment/Plan: S/P Procedure(s) (LRB): TYPE A AORTIC DISSECTION REPAIR WITH CIRC ARREST (N/A) TRANSESOPHAGEAL ECHOCARDIOGRAM (TEE) (N/A)   1 hemodyn stable in sinus with occ pvc's, BP controlled.  2 d/c epw's 3 CIR when bed avail  LOS: 11 days    GOLD,WAYNE E 11/05/2015  patient examined and medical record reviewed,agree with above note. Leave chest tube places in place but remove pacing wires today No beta blocker for chronic cocaine habit-continue by mouth Catapres Kathlee Nations Trigt III 11/05/2015

## 2015-11-05 NOTE — Progress Notes (Signed)
EPW removed.  Wires intact.  Pt tol well.  Education provided on remaining in bed x 1 hour.  BP and pulse to be monitored q 15 minutes.  VS stable at this time.

## 2015-11-05 NOTE — Interval H&P Note (Signed)
[name] was admitted today to Inpatient Rehabilitation with the diagnosis of right PCA infarct.  The patient's history has been reviewed, patient examined, and there is no change in status.  Patient continues to be appropriate for intensive inpatient rehabilitation.  I have reviewed the patient's chart and labs.  Questions were answered to the patient's satisfaction. The PAPE has been reviewed and assessment remains appropriate.  [name] T 11/05/2015, 6:11 PM

## 2015-11-05 NOTE — Progress Notes (Signed)
Rehab admissions - I met with patient and his mother.  He plans to go home with his mother after rehab.  He is agreeable to rehab admission.  Bed available and will admit to acute inpatient rehab today.  Call me for questions.  #[phone]

## 2015-11-05 NOTE — H&P (Signed)
  Physical Medicine and Rehabilitation Admission H&P    Chief Complaint  Patient presents with  . Chest Pain  . Loss of Vision  : HPI: [name] is a 55 y.o. right handed male with history of hypertension , tobacco and polysubstance abuse, poor medical compliance as well as known history of type B descending thoracic aortic dissection diagnosed April 2016. Patient lives with his mother independent prior to admission. Mother can assist. One level home with ramped entrance. Presented 10/25/2015 with left hemianopsia headache and some right shoulder discomfort. CT of the head showed hypodense cortical lesion with mild edema and mass effect in the medial right occipital lobe. CTA of head and neck showed extensive dissection of thoracic aorta. No flow demonstrated in the proximal right vertebral artery. No intracranial large or medium vessel occlusion demonstrated. Echocardiogram with ejection fraction of 65% grade 1 diastolic dysfunction. Carotid Dopplers with no ICA stenosis. Urine drug screen positive cocaine. Patient did not receive TPA. Cardiothoracic surgery consulted underwent repair of ascending aortic dissection with replacement of the ascending aorta and reconstruction as well as placement of intra-aortic balloon pump 10/28/2015 per Dr. Van Trigt. Hospital course pain management. Maintained on aspirin for CVA prophylaxis. Subcutaneous Lovenox for DVT prophylaxis. Tolerating a regular diet. Acute blood loss anemia 10.3 and monitored. Psychiatry services consulted for chronic substance abuse recommends outpatient substance abuse counseling. Physical occupational therapy evaluations completed and ongoing with recommendations of physical medicine rehabilitation consult.Patient was admitted for a comprehensive rehab program  ROS Constitutional: Negative for fever and chills.  Eyes: Positive for blurred vision and double vision.  Respiratory: Negative for cough and shortness of breath.    Cardiovascular: Positive for palpitations. Negative for chest pain and leg swelling.  Gastrointestinal: Positive for constipation. Negative for nausea and vomiting.  Genitourinary: Negative for dysuria and hematuria.  Musculoskeletal: Negative for back pain and falls.  Skin: Negative for rash.  Neurological: Positive for headaches. Negative for seizures and loss of consciousness.  All other systems reviewed and are negative  Past Medical History  Diagnosis Date  . Malignant hypertension Dx 2016  . Aortic dissection, thoracic (HCC)    Past Surgical History  Procedure Laterality Date  . Rectal abscess    . Thoracic aortic aneurysm repair N/A 10/28/2015    Procedure: TYPE A AORTIC DISSECTION REPAIR WITH CIRC ARREST;  Surgeon: Peter Van Trigt, MD;  Location: [facility] OR;  Service: Open Heart Surgery;  Laterality: N/A;  . Tee without cardioversion N/A 10/28/2015    Procedure: TRANSESOPHAGEAL ECHOCARDIOGRAM (TEE);  Surgeon: Peter Van Trigt, MD;  Location: [facility] OR;  Service: Open Heart Surgery;  Laterality: N/A;   Family History  Problem Relation Age of Onset  . Diabetes Father   . Heart disease Father   . Dementia Father    Social History:  reports that he has been smoking Cigarettes.  He does not have any smokeless tobacco history on file. He reports that he does not drink alcohol or use illicit drugs. Allergies: No Known Allergies Medications Prior to Admission  Medication Sig Dispense Refill  . acetaminophen-codeine (TYLENOL #3) 300-30 MG per tablet Take 1 tablet by mouth every 8 (eight) hours as needed for moderate pain. (Patient not taking: Reported on 03/26/2015) 60 tablet 2  . ergocalciferol (DRISDOL) 50000 UNITS capsule Take 1 capsule (50,000 Units total) by mouth once a week. (Patient not taking: Reported on 03/26/2015) 9 capsule 0  . losartan (COZAAR) 100 MG tablet Take 1 tablet (100 mg total) by   mouth daily. (Patient not taking: Reported on 10/25/2015) 90 tablet 3  . metoprolol tartrate  (LOPRESSOR) 25 MG tablet Take 1 tablet (25 mg total) by mouth 2 (two) times daily. (Patient not taking: Reported on 10/25/2015) 180 tablet 3    Home: Home Living Family/patient expects to be discharged to:: Private residence Living Arrangements: Parent Available Help at Discharge: Family, Available PRN/intermittently Type of Home: House Home Access: Stairs to enter, Ramped entrance Entrance Stairs-Number of Steps: 4 Entrance Stairs-Rails: Right, Left Home Layout: One level Bathroom Shower/Tub: Tub/shower unit Bathroom Toilet: Handicapped height Home Equipment: Walker - 4 wheels   Functional History: Prior Function Level of Independence: Independent Comments: Patient works as electrician  Functional Status:  Mobility: Bed Mobility Overal bed mobility: Needs Assistance Bed Mobility: Sit to Sidelying Rolling: Min assist Sidelying to sit: Max assist Supine to sit: Supervision, HOB elevated Sit to supine: Max assist, +2 for physical assistance Sit to sidelying: Supervision General bed mobility comments: cues for technique Transfers Overall transfer level: Needs assistance Equipment used: Rolling walker (2 wheeled) Transfers: Sit to/from Stand Sit to Stand: Min assist Stand pivot transfers: Min assist, +2 physical assistance General transfer comment: increased time, cues to use momentum for decreased UE support Ambulation/Gait Ambulation/Gait assistance: Mod assist Ambulation Distance (Feet): 150 Feet (x 2) Assistive device: Rolling walker (2 wheeled) Gait Pattern/deviations: Step-to pattern, Shuffle General Gait Details: initially keeping R pelvis retracted, facilitation provided for R pelvic protraction, cues and assist for attending to obstacles on L side Gait velocity: decreased Gait velocity interpretation: Below normal speed for age/gender    ADL: ADL Overall ADL's : Needs assistance/impaired Eating/Feeding: Independent, Sitting Eating/Feeding Details (indicate  cue type and reason): pt turned plate to help him see all of the contents Grooming: Wash/dry hands, Minimal assistance, Standing Grooming Details (indicate cue type and reason): assist to locate soap and towels on L side, cues for aligning walker with sink Upper Body Bathing: Sitting, Minimal assitance Lower Body Bathing: Minimal assistance, Sit to/from stand Lower Body Bathing Details (indicate cue type and reason): unable to reach R foot Upper Body Dressing : Set up, Sitting Upper Body Dressing Details (indicate cue type and reason): changed soiled gown Lower Body Dressing: Minimal assistance, Sit to/from stand Lower Body Dressing Details (indicate cue type and reason): unable to reach R foot, can cross L foot over opposite knee to don and doff sock Toilet Transfer: Minimal assistance, Ambulation, RW Toileting- Clothing Manipulation and Hygiene: Minimal assistance, Sit to/from stand Toileting - Clothing Manipulation Details (indicate cue type and reason): LOB with pericare Functional mobility during ADLs: Rolling walker, Moderate assistance  Cognition: Cognition Overall Cognitive Status: Impaired/Different from baseline Orientation Level: Oriented X4 Cognition Arousal/Alertness: Awake/alert Behavior During Therapy: WFL for tasks assessed/performed Overall Cognitive Status: Impaired/Different from baseline Area of Impairment: Safety/judgement Memory: Decreased recall of precautions Safety/Judgement: Decreased awareness of safety  Physical Exam: Blood pressure 119/64, pulse 90, temperature 98.1 F (36.7 C), temperature source Oral, resp. rate 16, height 5' 7" (1.702 m), weight 109.135 kg (240 lb 9.6 oz), SpO2 94 %. Physical Exam  Gen: NAD, appears comfortable HENT: dentition fair. Oral mucosa pink and moist Head: Normocephalic.  Eyes: EOM are normal.  Neck: Normal range of motion. Neck supple. No thyromegaly present.  Cardiovascular: Normal rate and regular rhythm. no murmur or  rub Respiratory: Effort normal and breath sounds normal.  GI: Soft. Bowel sounds are normal. He exhibits no distension. Non-tender Musculoskeletal:  Right hip pain with IR/ER, FABER  Neurological: He is   alert.  Mood is flat but appropriate. He is able to provide his name and age as well as date of birth. Follows simple commands. Cognitively he appears normal. CN 2-12 intact except for Left homonymous hemianopsia. Strength 4/5 bilateral upper extremities. LE's grossly 3+ to 4/5 prox to distal. ?mild decrease of LT LUE. DTR's 1+, toes down.  Skin: Skin is warm and dry. No rash noted. No erythema. Chest incision clean/intact  No results found for this or any previous visit (from the past 48 hour(s)). Dg Chest 2 View  11/05/2015  CLINICAL DATA:  Aortic dissection and subsequent repair 1 week ago EXAM: CHEST  2 VIEW COMPARISON:  Portable chest x-ray of November 03, 2015 FINDINGS: The lungs are mildly hypoinflated. The right apical pneumothorax is no longer evident. Persistent retrocardiac density on the left is demonstrated. A trace of blunting of the lateral costophrenic angle on the left persists. On the right there are patchy densities in the upper lobe and in the mid to lower lung laterally. The cardiac silhouette remains enlarged. The pulmonary vascularity is not engorged. The sternal wires are intact. The PICC line tip projects over the midportion of the SVC. IMPRESSION: 1. Left lower lobe atelectasis or pneumonia with small left pleural effusion. Patchy atelectasis or infiltrate in the right upper and lower lung with trace right pleural effusion. Interval resolution of right apical pneumothorax. 2. Cardiomegaly without pulmonary vascular congestion. Electronically Signed   By: David  Jordan M.D.   On: 11/05/2015 08:01       Medical Problem List and Plan: 1. Left hemianopsia, gait deficits, and weakness  secondary to right PCA infarct/ status post aortic dissection repair 2.  DVT  Prophylaxis/Anticoagulation: Subcutaneous Lovenox. Monitor platelet counts of any signs of bleeding 3. Pain Management: Oxycodone and as needed  -pt with chronic right hip OA and related pain 4. Mood: Xanax 0.25 mg twice daily as needed for anxiety. 5. Neuropsych: This patient is capable of making decisions on his own behalf. 6. Skin/Wound Care: Routine skin checks 7. Fluids/Electrolytes/Nutrition: Routine I&O with follow up labs 8.Hypertension/Atrial fib.Amiodarone 200 mg BID,Clonidine 0.2 mg BID,Lasix 40 mg daily.Monitor with increased mobility. Fair BP and HR control at present. 9. Tobacco /Polysubstance abuse. Counseling    Post Admission Physician Evaluation: 1. Functional deficits secondary  to right PCA infarct. 2. Patient is admitted to receive collaborative, interdisciplinary care between the physiatrist, rehab nursing staff, and therapy team. 3. Patient's level of medical complexity and substantial therapy needs in context of that medical necessity cannot be provided at a lesser intensity of care such as a SNF. 4. Patient has experienced substantial functional loss from his/her baseline which was documented above under the "Functional History" and "Functional Status" headings.  Judging by the patient's diagnosis, physical exam, and functional history, the patient has potential for functional progress which will result in measurable gains while on inpatient rehab.  These gains will be of substantial and practical use upon discharge  in facilitating mobility and self-care at the household level. 5. Physiatrist will provide 24 hour management of medical needs as well as oversight of the therapy plan/treatment and provide guidance as appropriate regarding the interaction of the two. 6. 24 hour rehab nursing will assist with bladder management, bowel management, safety, skin/wound care, disease management, medication administration, pain management and patient education  and help integrate  therapy concepts, techniques,education, etc. 7. PT will assess and treat for/with: Lower extremity strength, range of motion, stamina, balance, functional mobility, safety, adaptive techniques and   equipment, pain mgt, sternal precautions, visual-spatial awareness, NMR, stroke education, community reintegration.   Goals are: mod I. 8. OT will assess and treat for/with: ADL's, functional mobility, safety, upper extremity strength, adaptive techniques and equipment, NMR, visual-spatial awareness, sternal precautions, ego support, stroke education .   Goals are: mod I. Therapy may proceed with showering this patient. 9. SLP will assess and treat for/with: n/a.  Goals are: n/a. 10. Case Management and Social Worker will assess and treat for psychological issues and discharge planning. 11. Team conference will be held weekly to assess progress toward goals and to determine barriers to discharge. 12. Patient will receive at least 3 hours of therapy per day at least 5 days per week. 13. ELOS: 7-10 days       14. Prognosis:  excellent     Sadhana Frater T. Smita Lesh, MD, FAAPMR [facility] 11/05/2015   11/05/2015 

## 2015-11-05 NOTE — Consult Note (Signed)
Physical Medicine and Rehabilitation Consult Reason for Consult: Right PCA infarct embolic secondary to aortic dissection Referring Physician: Dr. Morton Peters   HPI: [name] is a 55 y.o. right handed male with history of hypertension , tobacco and polysubstance abuse, poor medical compliance as well as known history of type B descending thoracic aortic dissection diagnosed April 2016. Patient lives with his mother independent prior to admission. Mother can assist. One level home with ramped entrance. Presented 10/25/2015 with left hemianopsia headache and some right shoulder discomfort. CT of the head showed hypodense cortical lesion with mild edema and mass effect in the medial right occipital lobe. CTA of head and neck showed extensive dissection of thoracic aorta. No flow demonstrated in the proximal right vertebral artery. No intracranial large or medium vessel occlusion demonstrated. Echocardiogram with ejection fraction of 65% grade 1 diastolic dysfunction. Carotid Dopplers with no ICA stenosis. Urine drug screen positive cocaine. Patient did not receive TPA. Cardiothoracic surgery consulted underwent repair of ascending aortic dissection with replacement of the ascending aorta and reconstruction as well as placement of intra-aortic balloon pump 10/28/2015 per Dr. Donata Clay. Hospital course pain management. Maintained on aspirin for CVA prophylaxis. Subcutaneous Lovenox for DVT prophylaxis. Tolerating a regular diet. Acute blood loss anemia 10.3 and monitored. Psychiatry services consulted for chronic substance abuse recommends outpatient substance abuse counseling. Physical occupational therapy evaluations completed and ongoing with recommendations of physical medicine rehabilitation consult.   Review of Systems  Constitutional: Negative for fever and chills.  Eyes: Positive for blurred vision and double vision.  Respiratory: Negative for cough and shortness of breath.     Cardiovascular: Positive for palpitations. Negative for chest pain and leg swelling.  Gastrointestinal: Positive for constipation. Negative for nausea and vomiting.  Genitourinary: Negative for dysuria and hematuria.  Musculoskeletal: Negative for back pain and falls.  Skin: Negative for rash.  Neurological: Positive for headaches. Negative for seizures and loss of consciousness.  All other systems reviewed and are negative.  Past Medical History  Diagnosis Date  . Malignant hypertension Dx 2016  . Aortic dissection, thoracic [facility])    Past Surgical History  Procedure Laterality Date  . Rectal abscess    . Thoracic aortic aneurysm repair N/A 10/28/2015    Procedure: TYPE A AORTIC DISSECTION REPAIR WITH CIRC ARREST;  Surgeon: Kerin Perna, MD;  Location: [facility] OR;  Service: Open Heart Surgery;  Laterality: N/A;  . Tee without cardioversion N/A 10/28/2015    Procedure: TRANSESOPHAGEAL ECHOCARDIOGRAM (TEE);  Surgeon: Kerin Perna, MD;  Location: [facility] OR;  Service: Open Heart Surgery;  Laterality: N/A;   Family History  Problem Relation Age of Onset  . Diabetes Father   . Heart disease Father   . Dementia Father    Social History:  reports that he has been smoking Cigarettes.  He does not have any smokeless tobacco history on file. He reports that he does not drink alcohol or use illicit drugs. Allergies: No Known Allergies Medications Prior to Admission  Medication Sig Dispense Refill  . acetaminophen-codeine (TYLENOL #3) 300-30 MG per tablet Take 1 tablet by mouth every 8 (eight) hours as needed for moderate pain. (Patient not taking: Reported on 03/26/2015) 60 tablet 2  . ergocalciferol (DRISDOL) 50000 UNITS capsule Take 1 capsule (50,000 Units total) by mouth once a week. (Patient not taking: Reported on 03/26/2015) 9 capsule 0  . losartan (COZAAR) 100 MG tablet Take 1 tablet (100 mg total) by mouth daily. (Patient  not taking: Reported on 10/25/2015) 90 tablet 3  . metoprolol tartrate  (LOPRESSOR) 25 MG tablet Take 1 tablet (25 mg total) by mouth 2 (two) times daily. (Patient not taking: Reported on 10/25/2015) 180 tablet 3    Home: Home Living Family/patient expects to be discharged to:: Private residence Living Arrangements: Parent Available Help at Discharge: Family, Available PRN/intermittently Type of Home: House Home Access: Stairs to enter, Ramped entrance Secretary/administrator of Steps: 4 Entrance Stairs-Rails: Right, Left Home Layout: One level Bathroom Shower/Tub: Engineer, manufacturing systems: Handicapped height Home Equipment: Environmental consultant - 4 wheels  Functional History: Prior Function Level of Independence: Independent Comments: Patient works as Conservation officer, historic buildings Status:  Mobility: Bed Mobility Overal bed mobility: Needs Assistance Bed Mobility: Sit to Sidelying Rolling: Min assist Sidelying to sit: Max assist Supine to sit: Supervision, HOB elevated Sit to supine: Max assist, +2 for physical assistance Sit to sidelying: Supervision General bed mobility comments: cues for technique Transfers Overall transfer level: Needs assistance Equipment used: Rolling walker (2 wheeled) Transfers: Sit to/from Stand Sit to Stand: Min assist Stand pivot transfers: Min assist, +2 physical assistance General transfer comment: increased time, cues to use momentum for decreased UE support Ambulation/Gait Ambulation/Gait assistance: Mod assist Ambulation Distance (Feet): 150 Feet (x 2) Assistive device: Rolling walker (2 wheeled) Gait Pattern/deviations: Step-to pattern, Shuffle General Gait Details: initially keeping R pelvis retracted, facilitation provided for R pelvic protraction, cues and assist for attending to obstacles on L side Gait velocity: decreased Gait velocity interpretation: Below normal speed for age/gender    ADL: ADL Overall ADL's : Needs assistance/impaired Eating/Feeding: Independent, Sitting Eating/Feeding Details (indicate cue  type and reason): pt turned plate to help him see all of the contents Grooming: Wash/dry hands, Minimal assistance, Standing Grooming Details (indicate cue type and reason): assist to locate soap and towels on L side, cues for aligning walker with sink Upper Body Bathing: Sitting, Minimal assitance Lower Body Bathing: Minimal assistance, Sit to/from stand Lower Body Bathing Details (indicate cue type and reason): unable to reach R foot Upper Body Dressing : Set up, Sitting Upper Body Dressing Details (indicate cue type and reason): changed soiled gown Lower Body Dressing: Minimal assistance, Sit to/from stand Lower Body Dressing Details (indicate cue type and reason): unable to reach R foot, can cross L foot over opposite knee to don and doff sock Toilet Transfer: Minimal assistance, Ambulation, RW Toileting- Clothing Manipulation and Hygiene: Minimal assistance, Sit to/from stand Toileting - Clothing Manipulation Details (indicate cue type and reason): LOB with pericare Functional mobility during ADLs: Rolling walker, Moderate assistance  Cognition: Cognition Overall Cognitive Status: Impaired/Different from baseline Orientation Level: Oriented X4 Cognition Arousal/Alertness: Awake/alert Behavior During Therapy: WFL for tasks assessed/performed Overall Cognitive Status: Impaired/Different from baseline Area of Impairment: Safety/judgement Memory: Decreased recall of precautions Safety/Judgement: Decreased awareness of safety  Blood pressure 118/68, pulse 94, temperature 98.1 F (36.7 C), temperature source Oral, resp. rate 20, height  (1.702 m), weight 109.135 kg (240 lb 9.6 oz), SpO2 95 %. Physical Exam  Vitals reviewed. HENT:  Head: Normocephalic.  Eyes: EOM are normal.  Neck: Normal range of motion. Neck supple. No thyromegaly present.  Cardiovascular: Normal rate and regular rhythm.   Respiratory: Effort normal and breath sounds normal.  GI: Soft. Bowel sounds are  normal. He exhibits no distension.  Musculoskeletal:  Right hip pain with IR/ER, FABER  Neurological: He is alert.  Mood is flat but appropriate. He does make eye contact with examiner. He is able  to provide his name and age as well as date of birth. Follows simple commands. Left homonymous hemianopsia. Strength 4/5 bilateral upper extremities. LE's grossly 3+ to 4/5 prox to distal.   Skin: Skin is warm and dry. No rash noted. No erythema.    No results found for this or any previous visit (from the past 24 hour(s)). Dg Chest Port 1 View  11/03/2015  CLINICAL DATA:  Shortness of breath, aortic dissection EXAM: PORTABLE CHEST 1 VIEW COMPARISON:  11/02/2015 FINDINGS: Stable cardiac silhouette enlargement. Stable PICC line. Band of opacity right apex suggesting scar or atelectasis, stable. Apical pneumothorax stable. Opacity left lung base stable. IMPRESSION: Stable appearance with continued right apical pneumothorax in atelectasis bilaterally left greater than right. Electronically Signed   By: Esperanza Heir M.D.   On: 11/03/2015 08:02    Assessment/Plan: Diagnosis: right PCA infarct, s/p aortic dissection repair 1. Does the need for close, 24 hr/day medical supervision in concert with the patient's rehab needs make it unreasonable for this patient to be served in a less intensive setting? Yes 2. Co-Morbidities requiring supervision/potential complications: htn, OA of right hip 3. Due to bladder management, bowel management, safety, skin/wound care, disease management, medication administration, pain management and patient education, does the patient require 24 hr/day rehab nursing? Yes 4. Does the patient require coordinated care of a physician, rehab nurse, PT (1-2 hrs/day, 5 days/week) and OT (1-2 hrs/day, 5 days/week) to address physical and functional deficits in the context of the above medical diagnosis(es)? Yes Addressing deficits in the following areas: balance, endurance, locomotion,  strength, transferring, bowel/bladder control, bathing, dressing, feeding, grooming, toileting and psychosocial support 5. Can the patient actively participate in an intensive therapy program of at least 3 hrs of therapy per day at least 5 days per week? Yes 6. The potential for patient to make measurable gains while on inpatient rehab is excellent 7. Anticipated functional outcomes upon discharge from inpatient rehab are modified independent  with PT, modified independent with OT, n/a with SLP. 8. Estimated rehab length of stay to reach the above functional goals is: 7 -10 days 9. Does the patient have adequate social supports and living environment to accommodate these discharge functional goals? Yes 10. Anticipated D/C setting: Home 11. Anticipated post D/C treatments: HH therapy and Outpatient therapy 12. Overall Rehab/Functional Prognosis: excellent  RECOMMENDATIONS: This patient's condition is appropriate for continued rehabilitative care in the following setting: CIR Patient has agreed to participate in recommended program. Yes Note that insurance prior authorization may be required for reimbursement for recommended care.  Comment: Rehab Admissions Coordinator to follow up.  Thanks,  [name], MD, [place] [facility]     11/05/2015

## 2015-11-05 NOTE — Care Management Note (Signed)
Case Management Note Previous CM note initiated by Raynald Blend RN, CM  Patient Details  Name: [name] MRN: [id number] Date of Birth: 1961-07-01  Subjective/Objective:     Recommend surgical replacement of his ascending aorta and reconstruction of arch vessels               Action/Plan:  11/05/2015  Pt is s/p aortic dissection repair that suffered MI on OR table, pt remains on ventilator with IV sedation, vasopressors, insulin, milrinone, amiodarone.  10/26/15  Pt is from home with mom, pt completely independent prior to admit.  Pt will need assistance with PCP and medications prior discharge   Expected Discharge Date:   11/05/15               Expected Discharge Plan:  IP Rehab Facility  In-House Referral:     Discharge planning Services  CM Consult  Post Acute Care Choice:    Choice offered to:  Patient  DME Arranged:  3-N-1, Walker rolling DME Agency:  [facility]  HH Arranged:    HH Agency:     Status of Service:  Completed, signed off  Medicare Important Message Given:    Date Medicare IM Given:    Medicare IM give by:    Date Additional Medicare IM Given:    Additional Medicare Important Message give by:     If discussed at Long Length of Stay Meetings, dates discussed:    Discharge Disposition: IP rehab- ([facility])   Additional Comments:  11/05/15- Donn Pierini RN, BSN - spoke with Genie with CIR- they have a bed for pt today and can admit pt later this afternoon if ok with attending she will f/u with attending team-  1400- plan is for pt to d/c to CIR today- d/c order placed  11/03/15- Raynald Blend- CSW consulted per attending to refer pt for drug abuse counseling.  [facility] has agreed to provided recommended DME.  Pt will receive DME with [facility] from [facility], agency will continue to follow pt throughout hospital stay and will provide DME prior to discharge.  HHPT recommended however pt is without insurance and stated he can not afford the cost out of  pocket.   Darrold Span, RN 11/05/2015, 1:52 PM

## 2015-11-05 NOTE — Progress Notes (Signed)
[id number] Brief education done as pt to go to CIR. Reviewed sternal precautions, IS and flutter valve and discussed smoking cessation. Gave pt fake cigarette and smoking cessation handout. Pt stated as long as he doesn't drink he doesn't smoke. Wrote down walking instructions for after discharge. Pt and mother receptive to ed. Pt voices concern re his eye sight.  PT to see today. We will continue to follow if he does not transfer today. Luetta Nutting RN BSN 11/05/2015 1:50 PM

## 2015-11-05 NOTE — H&P (View-Only) (Signed)
Physical Medicine and Rehabilitation Admission H&P    Chief Complaint  Patient presents with  . Chest Pain  . Loss of Vision  : HPI: [name] is a 55 y.o. right handed male with history of hypertension , tobacco and polysubstance abuse, poor medical compliance as well as known history of type B descending thoracic aortic dissection diagnosed April 2016. Patient lives with his mother independent prior to admission. Mother can assist. One level home with ramped entrance. Presented 10/25/2015 with left hemianopsia headache and some right shoulder discomfort. CT of the head showed hypodense cortical lesion with mild edema and mass effect in the medial right occipital lobe. CTA of head and neck showed extensive dissection of thoracic aorta. No flow demonstrated in the proximal right vertebral artery. No intracranial large or medium vessel occlusion demonstrated. Echocardiogram with ejection fraction of 65% grade 1 diastolic dysfunction. Carotid Dopplers with no ICA stenosis. Urine drug screen positive cocaine. Patient did not receive TPA. Cardiothoracic surgery consulted underwent repair of ascending aortic dissection with replacement of the ascending aorta and reconstruction as well as placement of intra-aortic balloon pump 10/28/2015 per Dr. Donata Clay. Hospital course pain management. Maintained on aspirin for CVA prophylaxis. Subcutaneous Lovenox for DVT prophylaxis. Tolerating a regular diet. Acute blood loss anemia 10.3 and monitored. Psychiatry services consulted for chronic substance abuse recommends outpatient substance abuse counseling. Physical occupational therapy evaluations completed and ongoing with recommendations of physical medicine rehabilitation consult.Patient was admitted for a comprehensive rehab program  ROS Constitutional: Negative for fever and chills.  Eyes: Positive for blurred vision and double vision.  Respiratory: Negative for cough and shortness of breath.    Cardiovascular: Positive for palpitations. Negative for chest pain and leg swelling.  Gastrointestinal: Positive for constipation. Negative for nausea and vomiting.  Genitourinary: Negative for dysuria and hematuria.  Musculoskeletal: Negative for back pain and falls.  Skin: Negative for rash.  Neurological: Positive for headaches. Negative for seizures and loss of consciousness.  All other systems reviewed and are negative  Past Medical History  Diagnosis Date  . Malignant hypertension Dx 2016  . Aortic dissection, thoracic [facility])    Past Surgical History  Procedure Laterality Date  . Rectal abscess    . Thoracic aortic aneurysm repair N/A 10/28/2015    Procedure: TYPE A AORTIC DISSECTION REPAIR WITH CIRC ARREST;  Surgeon: Kerin Perna, MD;  Location: [facility] OR;  Service: Open Heart Surgery;  Laterality: N/A;  . Tee without cardioversion N/A 10/28/2015    Procedure: TRANSESOPHAGEAL ECHOCARDIOGRAM (TEE);  Surgeon: Kerin Perna, MD;  Location: [facility] OR;  Service: Open Heart Surgery;  Laterality: N/A;   Family History  Problem Relation Age of Onset  . Diabetes Father   . Heart disease Father   . Dementia Father    Social History:  reports that he has been smoking Cigarettes.  He does not have any smokeless tobacco history on file. He reports that he does not drink alcohol or use illicit drugs. Allergies: No Known Allergies Medications Prior to Admission  Medication Sig Dispense Refill  . acetaminophen-codeine (TYLENOL #3) 300-30 MG per tablet Take 1 tablet by mouth every 8 (eight) hours as needed for moderate pain. (Patient not taking: Reported on 03/26/2015) 60 tablet 2  . ergocalciferol (DRISDOL) 50000 UNITS capsule Take 1 capsule (50,000 Units total) by mouth once a week. (Patient not taking: Reported on 03/26/2015) 9 capsule 0  . losartan (COZAAR) 100 MG tablet Take 1 tablet (100 mg total) by  mouth daily. (Patient not taking: Reported on 10/25/2015) 90 tablet 3  . metoprolol tartrate  (LOPRESSOR) 25 MG tablet Take 1 tablet (25 mg total) by mouth 2 (two) times daily. (Patient not taking: Reported on 10/25/2015) 180 tablet 3    Home: Home Living Family/patient expects to be discharged to:: Private residence Living Arrangements: Parent Available Help at Discharge: Family, Available PRN/intermittently Type of Home: House Home Access: Stairs to enter, Ramped entrance Secretary/administrator of Steps: 4 Entrance Stairs-Rails: Right, Left Home Layout: One level Bathroom Shower/Tub: Engineer, manufacturing systems: Handicapped height Home Equipment: Environmental consultant - 4 wheels   Functional History: Prior Function Level of Independence: Independent Comments: Patient works as Clinical cytogeneticist Status:  Mobility: Bed Mobility Overal bed mobility: Needs Assistance Bed Mobility: Sit to Sidelying Rolling: Min assist Sidelying to sit: Max assist Supine to sit: Supervision, HOB elevated Sit to supine: Max assist, +2 for physical assistance Sit to sidelying: Supervision General bed mobility comments: cues for technique Transfers Overall transfer level: Needs assistance Equipment used: Rolling walker (2 wheeled) Transfers: Sit to/from Stand Sit to Stand: Min assist Stand pivot transfers: Min assist, +2 physical assistance General transfer comment: increased time, cues to use momentum for decreased UE support Ambulation/Gait Ambulation/Gait assistance: Mod assist Ambulation Distance (Feet): 150 Feet (x 2) Assistive device: Rolling walker (2 wheeled) Gait Pattern/deviations: Step-to pattern, Shuffle General Gait Details: initially keeping R pelvis retracted, facilitation provided for R pelvic protraction, cues and assist for attending to obstacles on L side Gait velocity: decreased Gait velocity interpretation: Below normal speed for age/gender    ADL: ADL Overall ADL's : Needs assistance/impaired Eating/Feeding: Independent, Sitting Eating/Feeding Details (indicate  cue type and reason): pt turned plate to help him see all of the contents Grooming: Wash/dry hands, Minimal assistance, Standing Grooming Details (indicate cue type and reason): assist to locate soap and towels on L side, cues for aligning walker with sink Upper Body Bathing: Sitting, Minimal assitance Lower Body Bathing: Minimal assistance, Sit to/from stand Lower Body Bathing Details (indicate cue type and reason): unable to reach R foot Upper Body Dressing : Set up, Sitting Upper Body Dressing Details (indicate cue type and reason): changed soiled gown Lower Body Dressing: Minimal assistance, Sit to/from stand Lower Body Dressing Details (indicate cue type and reason): unable to reach R foot, can cross L foot over opposite knee to don and doff sock Toilet Transfer: Minimal assistance, Ambulation, RW Toileting- Clothing Manipulation and Hygiene: Minimal assistance, Sit to/from stand Toileting - Clothing Manipulation Details (indicate cue type and reason): LOB with pericare Functional mobility during ADLs: Rolling walker, Moderate assistance  Cognition: Cognition Overall Cognitive Status: Impaired/Different from baseline Orientation Level: Oriented X4 Cognition Arousal/Alertness: Awake/alert Behavior During Therapy: WFL for tasks assessed/performed Overall Cognitive Status: Impaired/Different from baseline Area of Impairment: Safety/judgement Memory: Decreased recall of precautions Safety/Judgement: Decreased awareness of safety  Physical Exam: Blood pressure 119/64, pulse 90, temperature 98.1 F (36.7 C), temperature source Oral, resp. rate 16, height  (1.702 m), weight 109.135 kg (240 lb 9.6 oz), SpO2 94 %. Physical Exam  Gen: NAD, appears comfortable HENT: dentition fair. Oral mucosa pink and moist Head: Normocephalic.  Eyes: EOM are normal.  Neck: Normal range of motion. Neck supple. No thyromegaly present.  Cardiovascular: Normal rate and regular rhythm. no murmur or  rub Respiratory: Effort normal and breath sounds normal.  GI: Soft. Bowel sounds are normal. He exhibits no distension. Non-tender Musculoskeletal:  Right hip pain with IR/ER, FABER  Neurological: He is  alert.  Mood is flat but appropriate. He is able to provide his name and age as well as date of birth. Follows simple commands. Cognitively he appears normal. CN 2-12 intact except for Left homonymous hemianopsia. Strength 4/5 bilateral upper extremities. LE's grossly 3+ to 4/5 prox to distal. ?mild decrease of LT LUE. DTR's 1+, toes down.  Skin: Skin is warm and dry. No rash noted. No erythema. Chest incision clean/intact  No results found for this or any previous visit (from the past 48 hour(s)). Dg Chest 2 View  11/05/2015  CLINICAL DATA:  Aortic dissection and subsequent repair 1 week ago EXAM: CHEST  2 VIEW COMPARISON:  Portable chest x-ray of November 03, 2015 FINDINGS: The lungs are mildly hypoinflated. The right apical pneumothorax is no longer evident. Persistent retrocardiac density on the left is demonstrated. A trace of blunting of the lateral costophrenic angle on the left persists. On the right there are patchy densities in the upper lobe and in the mid to lower lung laterally. The cardiac silhouette remains enlarged. The pulmonary vascularity is not engorged. The sternal wires are intact. The PICC line tip projects over the midportion of the SVC. IMPRESSION: 1. Left lower lobe atelectasis or pneumonia with small left pleural effusion. Patchy atelectasis or infiltrate in the right upper and lower lung with trace right pleural effusion. Interval resolution of right apical pneumothorax. 2. Cardiomegaly without pulmonary vascular congestion. Electronically Signed   By: David  [place] M.D.   On: 11/05/2015 08:01       Medical Problem List and Plan: 1. Left hemianopsia, gait deficits, and weakness  secondary to right PCA infarct/ status post aortic dissection repair 2.  DVT  Prophylaxis/Anticoagulation: Subcutaneous Lovenox. Monitor platelet counts of any signs of bleeding 3. Pain Management: Oxycodone and as needed  -pt with chronic right hip OA and related pain 4. Mood: Xanax 0.25 mg twice daily as needed for anxiety. 5. Neuropsych: This patient is capable of making decisions on his own behalf. 6. Skin/Wound Care: Routine skin checks 7. Fluids/Electrolytes/Nutrition: Routine I&O with follow up labs 8.Hypertension/Atrial fib.Amiodarone 200 mg BID,Clonidine 0.2 mg BID,Lasix 40 mg daily.Monitor with increased mobility. Fair BP and HR control at present. 9. Tobacco /Polysubstance abuse. Counseling    Post Admission Physician Evaluation: 1. Functional deficits secondary  to right PCA infarct. 2. Patient is admitted to receive collaborative, interdisciplinary care between the physiatrist, rehab nursing staff, and therapy team. 3. Patient's level of medical complexity and substantial therapy needs in context of that medical necessity cannot be provided at a lesser intensity of care such as a SNF. 4. Patient has experienced substantial functional loss from his/her baseline which was documented above under the "Functional History" and "Functional Status" headings.  Judging by the patient's diagnosis, physical exam, and functional history, the patient has potential for functional progress which will result in measurable gains while on inpatient rehab.  These gains will be of substantial and practical use upon discharge  in facilitating mobility and self-care at the household level. 5. Physiatrist will provide 24 hour management of medical needs as well as oversight of the therapy plan/treatment and provide guidance as appropriate regarding the interaction of the two. 6. 24 hour rehab nursing will assist with bladder management, bowel management, safety, skin/wound care, disease management, medication administration, pain management and patient education  and help integrate  therapy concepts, techniques,education, etc. 7. PT will assess and treat for/with: Lower extremity strength, range of motion, stamina, balance, functional mobility, safety, adaptive techniques and  equipment, pain mgt, sternal precautions, visual-spatial awareness, NMR, stroke education, community reintegration.   Goals are: mod I. 8. OT will assess and treat for/with: ADL's, functional mobility, safety, upper extremity strength, adaptive techniques and equipment, NMR, visual-spatial awareness, sternal precautions, ego support, stroke education .   Goals are: mod I. Therapy may proceed with showering this patient. 9. SLP will assess and treat for/with: n/a.  Goals are: n/a. 10. Case Management and Social Worker will assess and treat for psychological issues and discharge planning. 11. Team conference will be held weekly to assess progress toward goals and to determine barriers to discharge. 12. Patient will receive at least 3 hours of therapy per day at least 5 days per week. 13. ELOS: 7-10 days       14. Prognosis:  excellent     [name], MD, [facility] Physical Medicine & Rehabilitation 11/05/2015   11/05/2015

## 2015-11-05 NOTE — Progress Notes (Signed)
Pt transferred to Rehab from 2W22. Alert and orientated x 4. Orientated to rehab expectations, therapy staff, schedule, pain management, etc. Pt to begin therapy in the a.m.

## 2015-11-06 ENCOUNTER — Inpatient Hospital Stay (HOSPITAL_COMMUNITY): Payer: Self-pay | Admitting: Occupational Therapy

## 2015-11-06 ENCOUNTER — Inpatient Hospital Stay (HOSPITAL_COMMUNITY): Payer: Medicaid Other | Admitting: Physical Therapy

## 2015-11-06 ENCOUNTER — Inpatient Hospital Stay (HOSPITAL_COMMUNITY): Payer: Medicaid Other | Admitting: Occupational Therapy

## 2015-11-06 DIAGNOSIS — I10 Essential (primary) hypertension: Secondary | ICD-10-CM

## 2015-11-06 DIAGNOSIS — H53462 Homonymous bilateral field defects, left side: Secondary | ICD-10-CM

## 2015-11-06 DIAGNOSIS — I63531 Cerebral infarction due to unspecified occlusion or stenosis of right posterior cerebral artery: Secondary | ICD-10-CM

## 2015-11-06 DIAGNOSIS — I7101 Dissection of thoracic aorta: Secondary | ICD-10-CM

## 2015-11-06 MED ORDER — GUAIFENESIN-DM 100-10 MG/5ML PO SYRP
10.0000 mL | ORAL_SOLUTION | Freq: Four times a day (QID) | ORAL | Status: DC | PRN
  Administered 2015-11-06: 10 mL via ORAL
  Filled 2015-11-06: qty 10

## 2015-11-06 MED ORDER — BUDESONIDE-FORMOTEROL FUMARATE 160-4.5 MCG/ACT IN AERO
2.0000 | INHALATION_SPRAY | Freq: Two times a day (BID) | RESPIRATORY_TRACT | Status: DC
  Administered 2015-11-06 – 2015-11-12 (×11): 2 via RESPIRATORY_TRACT
  Filled 2015-11-06 (×2): qty 6

## 2015-11-06 MED ORDER — TRAZODONE HCL 50 MG PO TABS
25.0000 mg | ORAL_TABLET | Freq: Every evening | ORAL | Status: DC | PRN
  Administered 2015-11-06 – 2015-11-12 (×7): 25 mg via ORAL
  Filled 2015-11-06 (×7): qty 1

## 2015-11-06 NOTE — Evaluation (Signed)
Physical Therapy Assessment and Plan  Patient Details  Name: [name] MRN: [id number] Date of Birth: 12-Dec-1960  PT Diagnosis: Difficulty walking, Hemiparesis non-dominant and Impaired cognition Rehab Potential: Fair ELOS: 7 to 10 days   Today's Date: 11/06/2015 PT Individual Time: 0900-1000 PT Individual Time Calculation (min): 60 min    Problem List:  Patient Active Problem List   Diagnosis Date Noted  . Acute ischemic right PCA stroke ([place]) 11/05/2015  . Left homonymous hemianopsia   . Aortic dissection, thoracic ([place]) 10/28/2015  . Cerebral thrombosis with cerebral infarction 10/26/2015  . Aortic dissection ([place]) 10/25/2015  . Osteoarthritis of right hip 03/01/2015  . Aortic dissection, thoracoabdominal ([place]) 03/01/2015  . Vitamin D deficiency 01/28/2015  . Nonspecific abnormal electrocardiogram (ECG) (EKG) 01/26/2015  . Pre-diabetes 01/13/2015  . Benign paroxysmal positional vertigo 01/12/2015  . Alcohol abuse 01/07/2015  . Cocaine abuse 01/07/2015  . Tobacco abuse 01/07/2015  . Essential hypertension     Past Medical History:  Past Medical History  Diagnosis Date  . Malignant hypertension Dx 2016  . Aortic dissection, thoracic [facility])    Past Surgical History:  Past Surgical History  Procedure Laterality Date  . Rectal abscess    . Thoracic aortic aneurysm repair N/A 10/28/2015    Procedure: TYPE A AORTIC DISSECTION REPAIR WITH CIRC ARREST;  Surgeon: Ivin Poot, MD;  Location: [place];  Service: Open Heart Surgery;  Laterality: N/A;  . Tee without cardioversion N/A 10/28/2015    Procedure: TRANSESOPHAGEAL ECHOCARDIOGRAM (TEE);  Surgeon: Ivin Poot, MD;  Location: [place];  Service: Open Heart Surgery;  Laterality: N/A;    Assessment & Plan Clinical Impression: Patient is a 55 y.o. right handed male with history of hypertension , tobacco and polysubstance abuse, poor medical compliance as well as known history of type B descending thoracic aortic  dissection diagnosed April 2016. Patient lives with his mother independent prior to admission. Mother can assist. One level home with ramped entrance. Presented 10/25/2015 with left hemianopsia headache and some right shoulder discomfort. CT of the head showed hypodense cortical lesion with mild edema and mass effect in the medial right occipital lobe. CTA of head and neck showed extensive dissection of thoracic aorta. No flow demonstrated in the proximal right vertebral artery. No intracranial large or medium vessel occlusion demonstrated. Echocardiogram with ejection fraction of 62% grade 1 diastolic dysfunction. Carotid Dopplers with no ICA stenosis. Urine drug screen positive cocaine. Patient did not receive TPA. Cardiothoracic surgery consulted underwent repair of ascending aortic dissection with replacement of the ascending aorta and reconstruction as well as placement of intra-aortic balloon pump 10/28/2015 per Dr. Prescott Gum. Hospital course pain management. Maintained on aspirin for CVA prophylaxis. Subcutaneous Lovenox for DVT prophylaxis. Tolerating a regular diet. Acute blood loss anemia 10.3 and monitored. Psychiatry services consulted for chronic substance abuse recommends outpatient substance abuse counseling. Physical occupational therapy evaluations completed and ongoing with recommendations of physical medicine rehabilitation consult.  Patient transferred to CIR on 11/05/2015 .   Patient currently requires min with mobility secondary to muscle weakness, decreased cardiorespiratoy endurance and decreased problem solving, decreased safety awareness and decreased memory.  Prior to hospitalization, patient was independent  with mobility and lived with Other (Comment) (mother) in a House home.  Home access is 5Stairs to enter, Ramped entrance.  Patient will benefit from skilled PT intervention to maximize safe functional mobility, minimize fall risk and decrease caregiver burden for planned  discharge home with intermittent assist.  Anticipate patient  will benefit from follow up [place] at discharge.  PT - End of Session Activity Tolerance: Tolerates 30+ min activity with multiple rests Endurance Deficit: Yes PT Assessment Rehab Potential (ACUTE/IP ONLY): Fair Barriers to Discharge: Decreased caregiver support PT Patient demonstrates impairments in the following area(s): Balance;Endurance;Motor;Safety PT Transfers Functional Problem(s): Bed Mobility;Bed to Chair;Car PT Locomotion Functional Problem(s): Ambulation;Stairs PT Plan PT Intensity: Minimum of 1-2 x/day ,45 to 90 minutes PT Frequency: 5 out of 7 days PT Duration Estimated Length of Stay: 7 to 10 days PT Treatment/Interventions: Ambulation/gait training;Balance/vestibular training;Disease management/prevention;Community reintegration;DME/adaptive equipment instruction;Functional mobility training;Patient/family education;Neuromuscular re-education;Psychosocial support;Splinting/orthotics;Stair training;Therapeutic Activities;Therapeutic Exercise;UE/LE Strength taining/ROM;UE/LE Coordination activities;Visual/perceptual remediation/compensation;Wheelchair propulsion/positioning PT Transfers Anticipated Outcome(s): mod I transfers PT Locomotion Anticipated Outcome(s): mod I for gait, S for stairs PT Recommendation Follow Up Recommendations: Home health PT Patient destination: Home Equipment Recommended: To be determined  Skilled Therapeutic Intervention Pt evaluation completed and treatment plan initiated. Pt performed multiple sit to stand transfers with min guard to min A, pt required verbal cues throughout treatment for sternal precautions. Pt propelled w/c with B LEs for 100 feet x 2 with increased time. Pt required multiple rest breaks secondary to decreased activity tolerance. Pt returned to room and left sitting up in recliner with call bell within reach.   PT  Evaluation Precautions/Restrictions Precautions Precautions: Fall;Sternal Restrictions Weight Bearing Restrictions: Yes Other Position/Activity Restrictions: Sternal precautions General Chart Reviewed: Yes Family/Caregiver Present: No  Vitals: HR 95, BP 109/67, O2 sat 98% room air.  Pain No c/o pain.  Home Living/Prior Functioning Home Living Available Help at Discharge: Family;Friend(s);Available PRN/intermittently Type of Home: House Home Access: Stairs to enter;Ramped entrance Entrance Stairs-Number of Steps: 5 Entrance Stairs-Rails: Right;Left;Can reach both Home Layout: One level  Lives With: Other (Comment) (mother) Prior Function Level of Independence: Independent with gait;Independent with transfers  Able to Take Stairs?: Yes Driving: No Vision/Perception  As per OT evaluation.   Cognition Overall Cognitive Status: Impaired/Different from baseline Orientation Level: Oriented X4 Memory: Impaired Memory Impairment: Decreased recall of new information Awareness: Appears intact Problem Solving: Impaired Safety/Judgment: Impaired Sensation Sensation Light Touch: Appears Intact Motor  Motor Motor: Other (comment) (L hemiparesis)  Mobility Bed Mobility Bed Mobility: Supine to Sit;Sit to Supine Supine to Sit: 4: Min assist Sit to Supine: 4: Min assist Transfers Transfers: Yes Sit to Stand: 4: Min assist Stand to Sit: 4: Min assist Stand Pivot Transfers: 4: Min assist Locomotion  Ambulation Ambulation: Yes Ambulation/Gait Assistance: 4: Min assist Ambulation Distance (Feet): 50 Feet Assistive device: None Stairs / Additional Locomotion Stairs: Yes Stairs Assistance: 4: Min assist Stair Management Technique: Two rails Number of Stairs: 4 Height of Stairs: 6 Wheelchair Mobility Wheelchair Mobility: Yes Wheelchair Assistance: 5: Careers information officer: Both lower extermities Distance: 150  Trunk/Postural Assessment  Cervical  Assessment Cervical Assessment: Within Scientist, physiological Assessment: Within Functional Limits Lumbar Assessment Lumbar Assessment: Within Functional Limits Postural Control Postural Control: Deficits on evaluation  Balance Balance Balance Assessed: Yes Static Sitting Balance Static Sitting - Balance Support: Feet supported Static Sitting - Level of Assistance: 5: Stand by assistance Static Standing Balance Static Standing - Balance Support: During functional activity Static Standing - Level of Assistance: 4: Min assist Dynamic Standing Balance Dynamic Standing - Balance Support: During functional activity Dynamic Standing - Level of Assistance: 4: Min assist Extremity Assessment B UEs as per OT evaluation.    RLE Assessment RLE Assessment: Within Functional Limits LLE Assessment LLE Assessment: Exceptions to [facility] LLE AROM (degrees)  Overall AROM Left Lower Extremity: Within functional limits for tasks assessed LLE Strength LLE Overall Strength: Deficits LLE Overall Strength Comments: grossly 3-/5 to 3/5   See Function Navigator for Current Functional Status.   Refer to Care Plan for Long Term Goals  Recommendations for other services: None  Discharge Criteria: Patient will be discharged from PT if patient refuses treatment 3 consecutive times without medical reason, if treatment goals not met, if there is a change in medical status, if patient makes no progress towards goals or if patient is discharged from hospital.  The above assessment, treatment plan, treatment alternatives and goals were discussed and mutually agreed upon: by patient  Dub Amis 11/06/2015, 12:33 PM

## 2015-11-06 NOTE — Progress Notes (Signed)
Patient woke up out of sleep around 115 am with a forceful cough with phlegm that was not able to come up per patient, and patient requesting cough syrup. MD notified and orders received for Robitussin-DM PRN. Medica tied patient with Robitussin-DM 10 ml and Oxy IR 5 mg PO. Also reinforced education on splinting chest with pillow when coughing and sneezing to decrease some of the pain to the chest area. Patient verbalized understanding. Cont plan of care. Larone Kliethermes, Phill Mutter

## 2015-11-06 NOTE — Progress Notes (Signed)
Patient ID: [name], male   DOB: 21-Jun-1961, 55 y.o.   MRN: [id number]   11/06/15.  55 year old patient admitted for CIR on February 24 with left hemianopsia, gait deficits, and weakness secondary to right PCA infarct/ status post aortic dissection repair. Slept poorly last night  Past Medical History  Diagnosis Date  . Malignant hypertension Dx 2016  . Aortic dissection, thoracic [facility])     Past Surgical History  Procedure Laterality Date  . Rectal abscess    . Thoracic aortic aneurysm repair N/A 10/28/2015    Procedure: TYPE A AORTIC DISSECTION REPAIR WITH CIRC ARREST;  Surgeon: Kerin Perna, MD;  Location: [facility] OR;  Service: Open Heart Surgery;  Laterality: N/A;  . Tee without cardioversion N/A 10/28/2015    Procedure: TRANSESOPHAGEAL ECHOCARDIOGRAM (TEE);  Surgeon: Kerin Perna, MD;  Location: [facility] OR;  Service: Open Heart Surgery;  Laterality: N/A;    Intake/Output Summary (Last 24 hours) at 11/06/15 1417 Last data filed at 11/06/15 0815  Gross per 24 hour  Intake    240 ml  Output    800 ml  Net   -560 ml    Patient Vitals for the past 24 hrs:  BP Temp Temp src Pulse Resp SpO2 Weight  11/06/15 1250 115/77 mmHg 99.1 F (37.3 C) Oral 90 18 95 % -  11/06/15 1007 103/66 mmHg - - - - - -  11/06/15 0507 123/79 mmHg 98.6 F (37 C) Oral 90 17 95 % 237 lb 14 oz (107.9 kg)  11/05/15 1735 118/62 mmHg 98.7 F (37.1 C) Oral 96 20 100 % -        Physical Exam: Blood pressure 119/64, pulse 90, temperature 98.1 F (36.7 C), temperature source Oral, resp. rate 16, height  (1.702 m), weight 109.135 kg (240 lb 9.6 oz), SpO2 94 %. Physical Exam  Gen: NAD, appears comfortable HENT: dentition fair. Oral mucosa pink and moist Head: Normocephalic.  Eyes: EOM are normal.  Neck: Normal range of motion. Neck supple. No thyromegaly present.  Cardiovascular: Normal rate and regular rhythm. no murmur or rub; status post sternotomy Respiratory: Effort normal and breath  sounds normal.  GI: Soft. Bowel sounds are normal. He exhibits no distension. Non-tender Musculoskeletal:   Neurological: He is alert.  Mood is flat but appropriate. Cognitively he appears normal. CN 2-12 intact except for Left homonymous hemianopsia. Strength 4/5 bilateral upper extremities. LE's grossly 3+ to 4/5 prox to distal. ?mild decrease of LT LUE. DTR's 1+, toes down.  Skin: Skin is warm and dry. No rash noted. No erythema. Chest incision clean/intact   Lab Results Last 48 Hours    No results found for this or any previous visit (from the past 48 hour(s)).    Imaging Results (Last 48 hours)    Dg Chest 2 View  11/05/2015 CLINICAL DATA: Aortic dissection and subsequent repair 1 week ago EXAM: CHEST 2 VIEW COMPARISON: Portable chest x-ray of November 03, 2015 FINDINGS: The lungs are mildly hypoinflated. The right apical pneumothorax is no longer evident. Persistent retrocardiac density on the left is demonstrated. A trace of blunting of the lateral costophrenic angle on the left persists. On the right there are patchy densities in the upper lobe and in the mid to lower lung laterally. The cardiac silhouette remains enlarged. The pulmonary vascularity is not engorged. The sternal wires are intact. The PICC line tip projects over the midportion of the SVC. IMPRESSION: 1. Left lower lobe atelectasis or pneumonia with small  left pleural effusion. Patchy atelectasis or infiltrate in the right upper and lower lung with trace right pleural effusion. Interval resolution of right apical pneumothorax. 2. Cardiomegaly without pulmonary vascular congestion. Electronically Signed By: David Swaziland M.D. On: 11/05/2015 08:01        Medical Problem List and Plan: 1. Left hemianopsia, gait deficits, and weakness secondary to right PCA infarct/ status post aortic dissection repair 2. DVT Prophylaxis/Anticoagulation: Subcutaneous Lovenox. Monitor platelet counts of any signs of bleeding 3. Pain  Management: Oxycodone and as needed -pt with chronic right hip OA and related pain  4.Hypertension/Atrial fib.Amiodarone 200 mg BID,Clonidine 0.2 mg BID,Lasix 40 mg daily.Monitor with increased mobility. Fair BP and HR control at present. 5. Tobacco /Polysubstance abuse. Counseling

## 2015-11-06 NOTE — Evaluation (Signed)
Occupational Therapy Assessment and Plan  Patient Details  Name: ARRAN FESSEL MRN: [id number] Date of Birth: Feb 05, 1961  OT Diagnosis: abnormal posture, blindness and low vision, disturbance of vision, hemiplegia affecting non-dominant side and muscle weakness (generalized) Rehab Potential: Rehab Potential (ACUTE ONLY): Good ELOS: 7-10 days   Today's Date: 11/06/2015 OT Individual Time: 0800-0900 OT Individual Time Calculation (min): 60 min     Problem List:  Patient Active Problem List   Diagnosis Date Noted  . Acute ischemic right PCA stroke ([place]) 11/05/2015  . Left homonymous hemianopsia   . Aortic dissection, thoracic ([place]) 10/28/2015  . Cerebral thrombosis with cerebral infarction 10/26/2015  . Aortic dissection ([place]) 10/25/2015  . Osteoarthritis of right hip 03/01/2015  . Aortic dissection, thoracoabdominal ([place]) 03/01/2015  . Vitamin D deficiency 01/28/2015  . Nonspecific abnormal electrocardiogram (ECG) (EKG) 01/26/2015  . Pre-diabetes 01/13/2015  . Benign paroxysmal positional vertigo 01/12/2015  . Alcohol abuse 01/07/2015  . Cocaine abuse 01/07/2015  . Tobacco abuse 01/07/2015  . Essential hypertension     Past Medical History:  Past Medical History  Diagnosis Date  . Malignant hypertension Dx 2016  . Aortic dissection, thoracic [facility])    Past Surgical History:  Past Surgical History  Procedure Laterality Date  . Rectal abscess    . Thoracic aortic aneurysm repair N/A 10/28/2015    Procedure: TYPE A AORTIC DISSECTION REPAIR WITH CIRC ARREST;  Surgeon: Ivin Poot, MD;  Location: [place];  Service: Open Heart Surgery;  Laterality: N/A;  . Tee without cardioversion N/A 10/28/2015    Procedure: TRANSESOPHAGEAL ECHOCARDIOGRAM (TEE);  Surgeon: Ivin Poot, MD;  Location: [place];  Service: Open Heart Surgery;  Laterality: N/A;    Assessment & Plan Clinical Impression: Norfleet Capers Rossa is a 55 y.o. right handed male with history of hypertension , tobacco and  polysubstance abuse, poor medical compliance as well as known history of type B descending thoracic aortic dissection diagnosed April 2016. Patient lives with his mother independent prior to admission. Mother can assist. One level home with ramped entrance. Presented 10/25/2015 with left hemianopsia headache and some right shoulder discomfort. CT of the head showed hypodense cortical lesion with mild edema and mass effect in the medial right occipital lobe. CTA of head and neck showed extensive dissection of thoracic aorta. No flow demonstrated in the proximal right vertebral artery. No intracranial large or medium vessel occlusion demonstrated. Echocardiogram with ejection fraction of 82% grade 1 diastolic dysfunction. Carotid Dopplers with no ICA stenosis. Urine drug screen positive cocaine. Patient did not receive TPA. Cardiothoracic surgery consulted underwent repair of ascending aortic dissection with replacement of the ascending aorta and reconstruction as well as placement of intra-aortic balloon pump 10/28/2015 per Dr. Prescott Gum. Hospital course pain management. Maintained on aspirin for CVA prophylaxis. Subcutaneous Lovenox for DVT prophylaxis. Tolerating a regular diet. Acute blood loss anemia 10.3 and monitored. Psychiatry services consulted for chronic substance abuse recommends outpatient substance abuse counseling. Physical occupational therapy evaluations completed and ongoing with recommendations of physical medicine rehabilitation consult.Patient was admitted for a comprehensive rehab programPatient transferred to CIR on 11/05/2015 .    Patient currently requires min with basic self-care skills secondary to muscle weakness, decreased cardiorespiratoy endurance, decreased visual acuity, field cut and hemianopsia and decreased standing balance, decreased postural control and decreased balance strategies.  Prior to hospitalization, patient could complete ADLs/IADLs with independent .  Patient will  benefit from skilled intervention to increase independence with basic self-care skills and increase level  of independence with iADL prior to discharge home with care partner.  Anticipate patient will require intermittent supervision and follow up outpatient.  OT - End of Session Activity Tolerance: Tolerates 10 - 20 min activity with multiple rests Endurance Deficit: Yes OT Assessment Rehab Potential (ACUTE ONLY): Good OT Patient demonstrates impairments in the following area(s): Balance;Endurance;Safety;Pain;Motor OT Basic ADL's Functional Problem(s): Bathing;Dressing;Toileting OT Advanced ADL's Functional Problem(s): Simple Meal Preparation OT Transfers Functional Problem(s): Toilet;Tub/Shower OT Additional Impairment(s): Fuctional Use of Upper Extremity OT Plan OT Intensity: Minimum of 1-2 x/day, 45 to 90 minutes OT Frequency: 5 out of 7 days OT Duration/Estimated Length of Stay: 7-10 days OT Treatment/Interventions: Balance/vestibular training;Discharge planning;Community reintegration;DME/adaptive equipment instruction;Functional mobility training;Neuromuscular re-education;Pain management;Patient/family education;Therapeutic Activities;Therapeutic Exercise;UE/LE Coordination activities;UE/LE Strength taining/ROM;Visual/perceptual remediation/compensation OT Self Feeding Anticipated Outcome(s): Independent OT Basic Self-Care Anticipated Outcome(s): Mod I OT Toileting Anticipated Outcome(s): Mod I OT Bathroom Transfers Anticipated Outcome(s): Mod I OT Recommendation Patient destination: Home Follow Up Recommendations: Outpatient OT Equipment Recommended: To be determined   Skilled Therapeutic Intervention Pt seen for OT eval and ADL bathing/ dressing session. Pt sitting up in recliner upon arrival, agreeable to tx session. He voiced complaints of "feeling weird/ unsafe" following visual tracking assessment, however, reclined any blurry or double vision. Pt with L hemianopsia, unable  to see L of midline with head at midline orientation. Educated regard "lighthouse" technique for visual scanning. He ambulated throughout room with RW and min A max cuing and tactile assist required for RW management during functional ambulation to maintain inside RW.. Bathed seated on tub bench, using lateral leans to complete buttock hygiene. Pt with R hip discomfort PTA, requiring assist for washing R LE and donning R sock. Grooming completed standing at sink with CGA. Pt returned to recliner at end of session, left with all needs in reach awaiting next therapist. Pt required rest breaks throughout ADL session due to decreased functional activity tolerance.  Educated throughout session regarding role of OT, OT goals, POC, CIR, RW management, fall risk, and d/c planning.   OT Evaluation Precautions/Restrictions  Precautions Precautions: Fall;Sternal Restrictions Weight Bearing Restrictions: Yes Other Position/Activity Restrictions: Sternal precautions General Chart Reviewed: Yes Vital Signs Therapy Vitals BP: 103/66 mmHg Pain Pain Assessment Pain Assessment: 0-10 Pain Score: 3  Pain Type: Surgical pain Pain Location: Chest Pain Orientation: Medial Pain Descriptors / Indicators: Aching;Sore Pain Onset: Gradual Pain Intervention(s): Repositioned, shower, RN made aware, medical given Home Living/Prior Functioning Home Living Available Help at Discharge: Family, Friend(s), Available PRN/intermittently Type of Home: House Home Access: Stairs to enter, Ramped entrance Technical brewer of Steps: 5 Entrance Stairs-Rails: Right, Left, Can reach both Home Layout: One level Bathroom Shower/Tub: Tub/shower unit  Lives With: Family (Mother) IADL History Current License: No 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 Level of Independence: Independent with basic ADLs, Independent with homemaking with ambulation, Independent with transfers, Independent with  gait  Able to Take Stairs?: Yes Driving: No Vision/Perception  Vision- History Baseline Vision/History: No visual deficits Patient Visual Report: Peripheral vision impairment Vision- Assessment Eye Alignment: Within Functional Limits (R gaze preference) Ocular Range of Motion: Within Functional Limits Visual Fields: Left visual field deficit Additional Comments: L fieldcut; hemianopsia  Cognition Overall Cognitive Status: Within Functional Limits for tasks assessed Arousal/Alertness: Awake/alert Orientation Level: Person;Place;Situation Person: Oriented Place: Oriented Situation: Oriented Year: 2017 Month: February Day of Week: Incorrect Memory: Impaired Memory Impairment: Decreased recall of new information Immediate Memory Recall: Sock;Blue;Bed Memory Recall: Sock;Bed;Blue Memory Recall Sock: Without Cue  Memory Recall Blue: Without Cue Memory Recall Bed: Without Cue Awareness: Appears intact Problem Solving: Appears intact Safety/Judgment: Appears intact Sensation Sensation Light Touch: Appears Intact Proprioception: Appears Intact Coordination Gross Motor Movements are Fluid and Coordinated: Yes Fine Motor Movements are Fluid and Coordinated: Yes Finger Nose Finger Test: [facility] Motor  Motor Motor: Other (comment) (L hemiparesis) Mobility  Bed Mobility Bed Mobility: Supine to Sit;Sit to Supine Supine to Sit: 4: Min assist Sit to Supine: 4: Min assist Transfers Sit to Stand: 4: Min assist Stand to Sit: 4: Min assist  Trunk/Postural Assessment  Cervical Assessment Cervical Assessment: Within Functional Limits Thoracic Assessment Thoracic Assessment: Within Functional Limits Lumbar Assessment Lumbar Assessment: Within Functional Limits Postural Control Postural Control: Within Functional Limits  Balance Balance Balance Assessed: Yes Static Sitting Balance Static Sitting - Balance Support: Feet supported Static Sitting - Level of Assistance: 5: Stand by  assistance Dynamic Sitting Balance Dynamic Sitting - Balance Support: Feet supported Dynamic Sitting - Level of Assistance: 5: Stand by assistance;4: Min assist Sitting balance - Comments: Sitting to complete bathing task Static Standing Balance Static Standing - Balance Support: During functional activity Static Standing - Level of Assistance: 4: Min assist Dynamic Standing Balance Dynamic Standing - Balance Support: During functional activity Dynamic Standing - Level of Assistance: 4: Min assist Dynamic Standing - Comments: Standing bathing/dressing tasks Extremity/Trunk Assessment RUE Assessment RUE Assessment: Not tested (Not formally assessed due to sternal precautions but appear to be [facility]. Gross grasp and finger coordination intact. ) LUE Assessment LUE Assessment: Not tested (Not formally assessed due to sternal precautions but appear to be [facility]. Gross grasp and finger coordination intact.)   See Function Navigator for Current Functional Status.   Refer to Care Plan for Long Term Goals  Recommendations for other services: None  Discharge Criteria: Patient will be discharged from OT if patient refuses treatment 3 consecutive times without medical reason, if treatment goals not met, if there is a change in medical status, if patient makes no progress towards goals or if patient is discharged from hospital.  The above assessment, treatment plan, treatment alternatives and goals were discussed and mutually agreed upon: by patient  Ernestina Patches 11/06/2015, 12:44 PM

## 2015-11-06 NOTE — Progress Notes (Signed)
Occupational Therapy Session Note  Patient Details  Name: [name] MRN: [id number] Date of Birth: 10/01/1960  Today's Date: 11/06/2015 OT Individual Time: 3086-5784 OT Individual Time Calculation (min): 98 min    Short Term Goals: Week 1:  OT Short Term Goal 1 (Week 1): STG=LTG due to LOS  Skilled Therapeutic Interventions/Progress Updates: upon approach for therapy patient was seated in his recliner and stated, "I need to get my eyes right because I do not have any peripheral vision, and it is throwing me off balance."    As well he state, "I freaked out when I loooked at my reflection in the mirror, and I only had half of a body - on my right side.  It is like my vision today shut down in that left eye."   He went on to state that once he can see better, his mobility will be safer and more independent.   Patient exhibited many visual perceptual challenges during today's session, including left visual field cut, depth perception, peripheral challenges, close up visual acuity and focus - especially in left line of vision, and others.  Patient did request to void; so, he ambulated via RW with instructions to continuing turn his head to his left environment to know what was ahead.   He was able to complete the transfer and stand for cleansing and self care with close S.  Today's session mainly focused on visual perceptual therapy.  Patient's mother came in at end of session .     Therapy Documentation Precautions:  Precautions Precautions: Fall, Sternal Restrictions Weight Bearing Restrictions: Yes Other Position/Activity Restrictions: Sternal precautions   Pain:denied    See Function Navigator for Current Functional Status.   Therapy/Group: Individual Therapy  Rozelle Logan 11/06/2015, 5:20 PM

## 2015-11-07 ENCOUNTER — Inpatient Hospital Stay (HOSPITAL_COMMUNITY): Payer: Self-pay

## 2015-11-07 NOTE — Progress Notes (Signed)
Occupational Therapy Session Note  Patient Details  Name: [name] MRN: [id number] Date of Birth: 1961-02-14  Today's Date: 11/07/2015 OT Individual Time: 1330-1356 OT Individual Time Calculation (min): 26 min    Short Term Goals: Week 1:  OT Short Term Goal 1 (Week 1): STG=LTG due to LOS  Skilled Therapeutic Interventions/Progress Updates: Therapeutic activity with focus on general endurance, functional mobility using RW, adherence to sternal precautions, and visual scanning using dynavision.   Pt received seated in his recliner and receptive for final session.   Pt able to rise from chair and ambulate to [facility] clinic with supervision for sternal precautions, min guard assist during mobility, and one standing rest break while ambulating.   Pt re-educated on visual scanning strategies for left field cut.   Pt completed seated right lower quadrant, seated left lower quadrant, seated combined lower quadrants, and standing with all 4 quadrants activated.   Seated quadrants completed with 30 second duration, standing was performed for 60 seconds.   Pt's reaction time improved from average of 2:47 seconds at left quadrant at beginning of session to 1:74 seconds at end of session in left quadrant.   Pt returned to his room and recovered in recliner at end of session with all needs placed within reach.    Therapy Documentation Precautions:  Precautions Precautions: Fall, Sternal Restrictions Weight Bearing Restrictions: Yes (Sternal precautions) Other Position/Activity Restrictions: Sternal precautions  Vital Signs: Therapy Vitals Temp: 97.7 F (36.5 C) Temp Source: Oral Pulse Rate: 85 Resp: 18 BP: 113/63 mmHg Patient Position (if appropriate): Sitting Oxygen Therapy SpO2: 98 % O2 Device: Not Delivered   Pain: Pain Assessment Pain Score: 3   See Function Navigator for Current Functional Status.   Therapy/Group: Individual Therapy  Micheal Sheen 11/07/2015, 3:12 PM

## 2015-11-07 NOTE — Progress Notes (Signed)
Patient ID: [name], male   DOB: 11/12/1960, 55 y.o.   MRN: [id number]  Patient ID: [name], male   DOB: [birth year], 55 y.o.   MRN: [id number]   11/07/15.  55 year old patient admitted for CIR on February 24 with left hemianopsia, gait deficits, and weakness secondary to right PCA infarct/ status post aortic dissection repair. Slept better last night.  Only complaint is some persistent visual deficits.  Patchy bilateral infiltrates noted on CXR 11/05/15.  No cough or congestion. Remains afebrile  Past Medical History  Diagnosis Date  . Malignant hypertension Dx 2016  . Aortic dissection, thoracic [facility])     Past Surgical History  Procedure Laterality Date  . Rectal abscess    . Thoracic aortic aneurysm repair N/A 10/28/2015    Procedure: TYPE A AORTIC DISSECTION REPAIR WITH CIRC ARREST;  Surgeon: Kerin Perna, MD;  Location: [facility] OR;  Service: Open Heart Surgery;  Laterality: N/A;  . Tee without cardioversion N/A 10/28/2015    Procedure: TRANSESOPHAGEAL ECHOCARDIOGRAM (TEE);  Surgeon: Kerin Perna, MD;  Location: [facility] OR;  Service: Open Heart Surgery;  Laterality: N/A;    Intake/Output Summary (Last 24 hours) at 11/07/15 0843 Last data filed at 11/07/15 0653  Gross per 24 hour  Intake    360 ml  Output   1000 ml  Net   -640 ml    Patient Vitals for the past 24 hrs:  BP Temp Temp src Pulse Resp SpO2 Weight  11/07/15 0458 132/81 mmHg 97.8 F (36.6 C) Oral 77 18 100 % 227 lb 8.2 oz (103.2 kg)  11/06/15 1250 115/77 mmHg 99.1 F (37.3 C) Oral 90 18 95 % -  11/06/15 1007 103/66 mmHg - - - - - -        Physical Exam: Blood pressure 119/64, pulse 90, temperature 98.1 F (36.7 C), temperature source Oral, resp. rate 16, height  (1.702 m), weight 109.135 kg (240 lb 9.6 oz), SpO2 94 %. Physical Exam  Gen: NAD, appears comfortable HENT: dentition fair. Oral mucosa pink and moist Head: Normocephalic.  Eyes: EOM are normal.  Neck: Normal range of motion. Neck  supple. No thyromegaly present.  Cardiovascular: Normal rate and regular rhythm. no murmur or rub; status post sternotomy Respiratory: Effort normal and breath sounds normal. slight decrease BS L base GI: Soft. Bowel sounds are normal. He exhibits no distension. Non-tender Musculoskeletal:   Neurological: He is alert.  Mood appropriate. Cognitively he appears normal. CN 2-12 intact except for Left homonymous hemianopsia. Strength 4/5 bilateral upper extremities. LE's grossly 3+ to 4/5 prox to distal. ?mild decrease of LT LUE. DTR's 1+, toes down.  Skin: Skin is warm and dry. No rash noted. No erythema. Chest incision clean/intact   Lab Results Last 48 Hours    No results found for this or any previous visit (from the past 48 hour(s)).    Imaging Results (Last 48 hours)    Dg Chest 2 View  11/05/2015 CLINICAL DATA: Aortic dissection and subsequent repair 1 week ago EXAM: CHEST 2 VIEW COMPARISON: Portable chest x-ray of November 03, 2015 FINDINGS: The lungs are mildly hypoinflated. The right apical pneumothorax is no longer evident. Persistent retrocardiac density on the left is demonstrated. A trace of blunting of the lateral costophrenic angle on the left persists. On the right there are patchy densities in the upper lobe and in the mid to lower lung laterally. The cardiac silhouette remains enlarged. The pulmonary vascularity is not engorged. The  sternal wires are intact. The PICC line tip projects over the midportion of the SVC. IMPRESSION: 1. Left lower lobe atelectasis or pneumonia with small left pleural effusion. Patchy atelectasis or infiltrate in the right upper and lower lung with trace right pleural effusion. Interval resolution of right apical pneumothorax. 2. Cardiomegaly without pulmonary vascular congestion. Electronically Signed By: David Swaziland M.D. On: 11/05/2015 08:01        Medical Problem List and Plan: 1. Left hemianopsia, gait deficits, and weakness secondary  to right PCA infarct/ status post aortic dissection repair 2. DVT Prophylaxis/Anticoagulation: Subcutaneous Lovenox. Monitor platelet counts of any signs of bleeding 3. Pain Management: Oxycodone and as needed -pt with chronic right hip OA and related pain  4.Hypertension/Atrial fib.Amiodarone 200 mg BID,Clonidine 0.2 mg BID,Lasix 40 mg daily.Monitor with increased mobility. Fair BP and HR control at present. 5. Tobacco /Polysubstance abuse. Counseling

## 2015-11-08 ENCOUNTER — Inpatient Hospital Stay (HOSPITAL_COMMUNITY): Payer: Medicaid Other | Admitting: Occupational Therapy

## 2015-11-08 ENCOUNTER — Inpatient Hospital Stay (HOSPITAL_COMMUNITY): Payer: Medicaid Other | Admitting: Physical Therapy

## 2015-11-08 LAB — COMPREHENSIVE METABOLIC PANEL
ALBUMIN: 2.8 g/dL — AB (ref 3.5–5.0)
ALK PHOS: 70 U/L (ref 38–126)
ALT: 28 U/L (ref 17–63)
AST: 24 U/L (ref 15–41)
Anion gap: 14 (ref 5–15)
BUN: 20 mg/dL (ref 6–20)
CALCIUM: 8.8 mg/dL — AB (ref 8.9–10.3)
CHLORIDE: 100 mmol/L — AB (ref 101–111)
CO2: 25 mmol/L (ref 22–32)
CREATININE: 1.03 mg/dL (ref 0.61–1.24)
GFR calc Af Amer: >60 mL/min (ref >60)
GFR calc non Af Amer: >60 mL/min (ref >60)
GLUCOSE: 106 mg/dL — AB (ref 65–99)
Potassium: 4.9 mmol/L (ref 3.5–5.1)
SODIUM: 139 mmol/L (ref 135–145)
Total Bilirubin: 0.3 mg/dL (ref 0.3–1.2)
Total Protein: 6.9 g/dL (ref 6.5–8.1)

## 2015-11-08 LAB — CBC WITH DIFFERENTIAL/PLATELET
BASOS ABS: 0.1 10*3/uL (ref 0.0–0.1)
BASOS PCT: 0 %
EOS ABS: 0.3 10*3/uL (ref 0.0–0.7)
Eosinophils Relative: 2 %
HCT: 34.3 % — ABNORMAL LOW (ref 39.0–52.0)
HEMOGLOBIN: 10.7 g/dL — AB (ref 13.0–17.0)
LYMPHS ABS: 2.9 10*3/uL (ref 0.7–4.0)
Lymphocytes Relative: 24 %
MCH: 24.8 pg — ABNORMAL LOW (ref 26.0–34.0)
MCHC: 31.2 g/dL (ref 30.0–36.0)
MCV: 79.4 fL (ref 78.0–100.0)
Monocytes Absolute: 1.2 10*3/uL — ABNORMAL HIGH (ref 0.1–1.0)
Monocytes Relative: 10 %
NEUTROS PCT: 64 %
Neutro Abs: 8 10*3/uL — ABNORMAL HIGH (ref 1.7–7.7)
PLATELETS: 642 10*3/uL — AB (ref 150–400)
RBC: 4.32 MIL/uL (ref 4.22–5.81)
RDW: 13.9 % (ref 11.5–15.5)
WBC: 12.5 10*3/uL — AB (ref 4.0–10.5)

## 2015-11-08 NOTE — Progress Notes (Signed)
[name], MD Physician Signed Physical Medicine and Rehabilitation Consult Note 11/05/2015 6:13 AM  Related encounter: ED to Hosp-Admission (Discharged) from 10/25/2015 in [facility] [address] 2W CARDIAC UNIT    Expand All Collapse All        Physical Medicine and Rehabilitation Consult Reason for Consult: Right PCA infarct embolic secondary to aortic dissection Referring Physician: Dr. Morton Peters   HPI: [name] is a 55 y.o. right handed male with history of hypertension , tobacco and polysubstance abuse, poor medical compliance as well as known history of type B descending thoracic aortic dissection diagnosed April 2016. Patient lives with his mother independent prior to admission. Mother can assist. One level home with ramped entrance. Presented 10/25/2015 with left hemianopsia headache and some right shoulder discomfort. CT of the head showed hypodense cortical lesion with mild edema and mass effect in the medial right occipital lobe. CTA of head and neck showed extensive dissection of thoracic aorta. No flow demonstrated in the proximal right vertebral artery. No intracranial large or medium vessel occlusion demonstrated. Echocardiogram with ejection fraction of 65% grade 1 diastolic dysfunction. Carotid Dopplers with no ICA stenosis. Urine drug screen positive cocaine. Patient did not receive TPA. Cardiothoracic surgery consulted underwent repair of ascending aortic dissection with replacement of the ascending aorta and reconstruction as well as placement of intra-aortic balloon pump 10/28/2015 per Dr. Donata Clay. Hospital course pain management. Maintained on aspirin for CVA prophylaxis. Subcutaneous Lovenox for DVT prophylaxis. Tolerating a regular diet. Acute blood loss anemia 10.3 and monitored. Psychiatry services consulted for chronic substance abuse recommends outpatient substance abuse counseling. Physical occupational therapy evaluations completed and ongoing with  recommendations of physical medicine rehabilitation consult.   Review of Systems  Constitutional: Negative for fever and chills.  Eyes: Positive for blurred vision and double vision.  Respiratory: Negative for cough and shortness of breath.  Cardiovascular: Positive for palpitations. Negative for chest pain and leg swelling.  Gastrointestinal: Positive for constipation. Negative for nausea and vomiting.  Genitourinary: Negative for dysuria and hematuria.  Musculoskeletal: Negative for back pain and falls.  Skin: Negative for rash.  Neurological: Positive for headaches. Negative for seizures and loss of consciousness.  All other systems reviewed and are negative.  Past Medical History  Diagnosis Date  . Malignant hypertension Dx 2016  . Aortic dissection, thoracic [facility])    Past Surgical History  Procedure Laterality Date  . Rectal abscess    . Thoracic aortic aneurysm repair N/A 10/28/2015    Procedure: TYPE A AORTIC DISSECTION REPAIR WITH CIRC ARREST; Surgeon: Kerin Perna, MD; Location: [facility] OR; Service: Open Heart Surgery; Laterality: N/A;  . Tee without cardioversion N/A 10/28/2015    Procedure: TRANSESOPHAGEAL ECHOCARDIOGRAM (TEE); Surgeon: Kerin Perna, MD; Location: [facility] OR; Service: Open Heart Surgery; Laterality: N/A;   Family History  Problem Relation Age of Onset  . Diabetes Father   . Heart disease Father   . Dementia Father    Social History:  reports that he has been smoking Cigarettes. He does not have any smokeless tobacco history on file. He reports that he does not drink alcohol or use illicit drugs. Allergies: No Known Allergies Medications Prior to Admission  Medication Sig Dispense Refill  . acetaminophen-codeine (TYLENOL #3) 300-30 MG per tablet Take 1 tablet by mouth every 8 (eight) hours as needed for moderate pain. (Patient not taking: Reported on 03/26/2015) 60 tablet 2  . ergocalciferol  (DRISDOL) 50000 UNITS capsule Take 1 capsule (50,000 Units  total) by mouth once a week. (Patient not taking: Reported on 03/26/2015) 9 capsule 0  . losartan (COZAAR) 100 MG tablet Take 1 tablet (100 mg total) by mouth daily. (Patient not taking: Reported on 10/25/2015) 90 tablet 3  . metoprolol tartrate (LOPRESSOR) 25 MG tablet Take 1 tablet (25 mg total) by mouth 2 (two) times daily. (Patient not taking: Reported on 10/25/2015) 180 tablet 3    Home: Home Living Family/patient expects to be discharged to:: Private residence Living Arrangements: Parent Available Help at Discharge: Family, Available PRN/intermittently Type of Home: House Home Access: Stairs to enter, Ramped entrance Secretary/administrator of Steps: 4 Entrance Stairs-Rails: Right, Left Home Layout: One level Bathroom Shower/Tub: Engineer, manufacturing systems: Handicapped height Home Equipment: Environmental consultant - 4 wheels  Functional History: Prior Function Level of Independence: Independent Comments: Patient works as Conservation officer, historic buildings Status:  Mobility: Bed Mobility Overal bed mobility: Needs Assistance Bed Mobility: Sit to Sidelying Rolling: Min assist Sidelying to sit: Max assist Supine to sit: Supervision, HOB elevated Sit to supine: Max assist, +2 for physical assistance Sit to sidelying: Supervision General bed mobility comments: cues for technique Transfers Overall transfer level: Needs assistance Equipment used: Rolling walker (2 wheeled) Transfers: Sit to/from Stand Sit to Stand: Min assist Stand pivot transfers: Min assist, +2 physical assistance General transfer comment: increased time, cues to use momentum for decreased UE support Ambulation/Gait Ambulation/Gait assistance: Mod assist Ambulation Distance (Feet): 150 Feet (x 2) Assistive device: Rolling walker (2 wheeled) Gait Pattern/deviations: Step-to pattern, Shuffle General Gait Details: initially keeping R pelvis retracted,  facilitation provided for R pelvic protraction, cues and assist for attending to obstacles on L side Gait velocity: decreased Gait velocity interpretation: Below normal speed for age/gender    ADL: ADL Overall ADL's : Needs assistance/impaired Eating/Feeding: Independent, Sitting Eating/Feeding Details (indicate cue type and reason): pt turned plate to help him see all of the contents Grooming: Wash/dry hands, Minimal assistance, Standing Grooming Details (indicate cue type and reason): assist to locate soap and towels on L side, cues for aligning walker with sink Upper Body Bathing: Sitting, Minimal assitance Lower Body Bathing: Minimal assistance, Sit to/from stand Lower Body Bathing Details (indicate cue type and reason): unable to reach R foot Upper Body Dressing : Set up, Sitting Upper Body Dressing Details (indicate cue type and reason): changed soiled gown Lower Body Dressing: Minimal assistance, Sit to/from stand Lower Body Dressing Details (indicate cue type and reason): unable to reach R foot, can cross L foot over opposite knee to don and doff sock Toilet Transfer: Minimal assistance, Ambulation, RW Toileting- Clothing Manipulation and Hygiene: Minimal assistance, Sit to/from stand Toileting - Clothing Manipulation Details (indicate cue type and reason): LOB with pericare Functional mobility during ADLs: Rolling walker, Moderate assistance  Cognition: Cognition Overall Cognitive Status: Impaired/Different from baseline Orientation Level: Oriented X4 Cognition Arousal/Alertness: Awake/alert Behavior During Therapy: WFL for tasks assessed/performed Overall Cognitive Status: Impaired/Different from baseline Area of Impairment: Safety/judgement Memory: Decreased recall of precautions Safety/Judgement: Decreased awareness of safety  Blood pressure 118/68, pulse 94, temperature 98.1 F (36.7 C), temperature source Oral, resp. rate 20, height 5\' 7"  (1.702 m), weight 109.135  kg (240 lb 9.6 oz), SpO2 95 %. Physical Exam  Vitals reviewed. HENT:  Head: Normocephalic.  Eyes: EOM are normal.  Neck: Normal range of motion. Neck supple. No thyromegaly present.  Cardiovascular: Normal rate and regular rhythm.  Respiratory: Effort normal and breath sounds normal.  GI: Soft. Bowel sounds are normal. He exhibits no distension.  Musculoskeletal:  Right hip pain with IR/ER, FABER  Neurological: He is alert.  Mood is flat but appropriate. He does make eye contact with examiner. He is able to provide his name and age as well as date of birth. Follows simple commands. Left homonymous hemianopsia. Strength 4/5 bilateral upper extremities. LE's grossly 3+ to 4/5 prox to distal.  Skin: Skin is warm and dry. No rash noted. No erythema.     Lab Results Last 24 Hours    No results found for this or any previous visit (from the past 24 hour(s)).    Imaging Results (Last 48 hours)    Dg Chest Port 1 View  11/03/2015 CLINICAL DATA: Shortness of breath, aortic dissection EXAM: PORTABLE CHEST 1 VIEW COMPARISON: 11/02/2015 FINDINGS: Stable cardiac silhouette enlargement. Stable PICC line. Band of opacity right apex suggesting scar or atelectasis, stable. Apical pneumothorax stable. Opacity left lung base stable. IMPRESSION: Stable appearance with continued right apical pneumothorax in atelectasis bilaterally left greater than right. Electronically Signed By: Esperanza Heir M.D. On: 11/03/2015 08:02     Assessment/Plan: Diagnosis: right PCA infarct, s/p aortic dissection repair 1. Does the need for close, 24 hr/day medical supervision in concert with the patient's rehab needs make it unreasonable for this patient to be served in a less intensive setting? Yes 2. Co-Morbidities requiring supervision/potential complications: htn, OA of right hip 3. Due to bladder management, bowel management, safety, skin/wound care, disease management, medication administration, pain  management and patient education, does the patient require 24 hr/day rehab nursing? Yes 4. Does the patient require coordinated care of a physician, rehab nurse, PT (1-2 hrs/day, 5 days/week) and OT (1-2 hrs/day, 5 days/week) to address physical and functional deficits in the context of the above medical diagnosis(es)? Yes Addressing deficits in the following areas: balance, endurance, locomotion, strength, transferring, bowel/bladder control, bathing, dressing, feeding, grooming, toileting and psychosocial support 5. Can the patient actively participate in an intensive therapy program of at least 3 hrs of therapy per day at least 5 days per week? Yes 6. The potential for patient to make measurable gains while on inpatient rehab is excellent 7. Anticipated functional outcomes upon discharge from inpatient rehab are modified independent with PT, modified independent with OT, n/a with SLP. 8. Estimated rehab length of stay to reach the above functional goals is: 7 -10 days 9. Does the patient have adequate social supports and living environment to accommodate these discharge functional goals? Yes 10. Anticipated D/C setting: Home 11. Anticipated post D/C treatments: HH therapy and Outpatient therapy 12. Overall Rehab/Functional Prognosis: excellent  RECOMMENDATIONS: This patient's condition is appropriate for continued rehabilitative care in the following setting: CIR Patient has agreed to participate in recommended program. Yes Note that insurance prior authorization may be required for reimbursement for recommended care.  Comment: Rehab Admissions Coordinator to follow up.  Thanks,  [name], MD, [place] [facility]     11/05/2015       Revision History     Date/Time User Provider Type Action   11/05/2015 11:32 AM [name], MD Physician Sign   11/05/2015 6:59 AM Charlton Amor, PA-C Physician Assistant Pend   View Details Report       Routing History     Date/Time  From To Method   11/05/2015 11:32 AM [name], MD [name], MD In Basket   11/05/2015 11:32 AM [name], MD Dessa Phi, MD In Basket

## 2015-11-08 NOTE — Progress Notes (Signed)
Occupational Therapy Session Note  Patient Details  Name: [name] MRN: [id number] Date of Birth: [birth year]  Today's Date: 11/08/2015 OT Individual Time: 4098-1191 and 1405-1500 OT Individual Time Calculation (min): 75 min and 55 min   Short Term Goals: Week 1:  OT Short Term Goal 1 (Week 1): STG=LTG due to LOS  Skilled Therapeutic Interventions/Progress Updates:    1) ADL retraining with focus on functional mobility, transfers, and education on use of AE to increase independence.  Pt ambulated to room bathroom with RW with supervision.  Completed bathing at sit > stand level with supervision, setup for items placed on Lt to promote visual scanning to Lt.  Educated on energy conservation strategies, as pt reports increase fatigue post bathing.  Provided multiple variations on techniques to don Rt sock as pt reports difficulty due to Rt hip pain.  Pt most successful with use of sock aid. Grooming tasks completed in standing.  Ambulated to Dynavision with RW and supervision, pt demonstrating carryover of education to visually scan environment with head turns during ambulation.  Engaged in Dynavision in standing with pt locating 23/30 and 27/31 in 3.0 second time limit with increased reaction time and missed lights in Lt visual field.  Pt reports increased fatigue and eye fatigue and returned to room leaving semi-reclined in recliner with all needs in reach.  2) Treatment session with focus on treatment strategies for Lt visual field cut.  Pt declined leaving the room, reporting he was physically exhausted from AM sessions.  Engaged in seated visual tasks with focus on head and eye turns to Lt visual field with table top tasks.  Engaged in word search activity in sitting with puzzle placed at pt's midline, encouraging pt to turn his eyes and head to Lt to scan entire page.  Pt reports frustration with task, therefore downgraded task to "Spot It" with pt required to scan one card with 7-8 pictures  and locate the matching picture on the next card.  Placed one card at midline and other in Rt visual field, progressing to just Lt of midline and other up in vertical plane.  Pt reports difficulty but with increased success with eye and head turns to compensate.  Therapy Documentation Precautions:  Precautions Precautions: Fall, Sternal Restrictions Weight Bearing Restrictions:  (cardiac sternal precautions) Other Position/Activity Restrictions: Sternal precautions General:   Vital Signs: Oxygen Therapy O2 Device: Not Delivered Pain:  Pt reports pain in Rt hip when attempting to don socks, otherwise no c/o pain  See Function Navigator for Current Functional Status.   Therapy/Group: Individual Therapy  Rosalio Loud 11/08/2015, 9:06 AM

## 2015-11-08 NOTE — Plan of Care (Signed)
Problem: RH PAIN MANAGEMENT Goal: RH STG PAIN MANAGED AT OR BELOW PT'S PAIN GOAL <3  Outcome: Not Progressing Reports pain for 4

## 2015-11-08 NOTE — IPOC Note (Addendum)
Overall Plan of Care [facility]) Patient Details Name: [name] MRN: [id number] DOB: 24-Apr-1961  Admitting Diagnosis: PR PCA infarct after repairof aortic dissection  Hospital Problems: Principal Problem:   Acute ischemic right PCA stroke (HCC) Active Problems:   Cocaine abuse   Essential hypertension   Osteoarthritis of right hip   Aortic dissection, thoracic (HCC)   Left homonymous hemianopsia     Functional Problem List: Nursing Pain, Skin Integrity, Edema  PT Balance, Endurance, Motor, Safety  OT Balance, Endurance, Safety, Pain, Motor  SLP    TR         Basic ADL's: OT Bathing, Dressing, Toileting     Advanced  ADL's: OT Simple Meal Preparation     Transfers: PT Bed Mobility, Bed to Chair, Car  OT Toilet, Tub/Shower     Locomotion: PT Ambulation, Stairs     Additional Impairments: OT Fuctional Use of Upper Extremity  SLP        TR      Anticipated Outcomes Item Anticipated Outcome  Self Feeding Independent  Swallowing      Basic self-care  Mod I  Toileting  Mod I   Bathroom Transfers Mod I  Bowel/Bladder  Mod I  Transfers  mod I transfers  Locomotion  mod I for gait, S for stairs  Communication     Cognition     Pain  <3  Safety/Judgment  Mod I   Therapy Plan: PT Intensity: Minimum of 1-2 x/day ,45 to 90 minutes PT Frequency: 5 out of 7 days PT Duration Estimated Length of Stay: 7 to 10 days OT Intensity: Minimum of 1-2 x/day, 45 to 90 minutes OT Frequency: 5 out of 7 days OT Duration/Estimated Length of Stay: 7-10 days         Team Interventions: Nursing Interventions Disease Management/Prevention, Pain Management, Skin Care/Wound Management, Discharge Planning  PT interventions Ambulation/gait training, Balance/vestibular training, Disease management/prevention, Firefighter, Fish farm manager, Functional mobility training, Patient/family education, Neuromuscular re-education, Psychosocial support,  Splinting/orthotics, Stair training, Therapeutic Activities, Therapeutic Exercise, UE/LE Strength taining/ROM, UE/LE Coordination activities, Visual/perceptual remediation/compensation, Wheelchair propulsion/positioning  OT Interventions Warden/ranger, Discharge planning, Community reintegration, Fish farm manager, Functional mobility training, Neuromuscular re-education, Pain management, Patient/family education, Therapeutic Activities, Therapeutic Exercise, UE/LE Coordination activities, UE/LE Strength taining/ROM, Visual/perceptual remediation/compensation  SLP Interventions    TR Interventions    SW/CM Interventions  Psychosocial Support, Discharge Planning & Pt/Family education    Team Discharge Planning: Destination: PT-Home ,OT- Home , SLP-  Projected Follow-up: PT-Home health PT, OT-  Outpatient OT, SLP-  Projected Equipment Needs: PT-To be determined, OT- To be determined, SLP-  Equipment Details: PT- , OT-  Patient/family involved in discharge planning: PT- Patient,  OT-Patient, SLP-   MD ELOS: 7-10d Medical Rehab Prognosis:  Good Assessment: 55 y.o. right handed male with history of hypertension , tobacco and polysubstance abuse, poor medical compliance as well as known history of type B descending thoracic aortic dissection diagnosed April 2016. Patient lives with his mother independent prior to admission. Mother can assist. One level home with ramped entrance. Presented 10/25/2015 with left hemianopsia headache and some right shoulder discomfort. CT of the head showed hypodense cortical lesion with mild edema and mass effect in the medial right occipital lobe. CTA of head and neck showed extensive dissection of thoracic aorta. No flow demonstrated in the proximal right vertebral artery. No intracranial large or medium vessel occlusion demonstrated. Echocardiogram with ejection fraction of 65% grade 1 diastolic dysfunction. Carotid Dopplers with  no ICA  stenosis. Urine drug screen positive cocaine. Patient did not receive TPA. Cardiothoracic surgery consulted underwent repair of ascending aortic dissection with replacement of the ascending aorta and reconstruction as well as placement of intra-aortic balloon pump 10/28/2015 per Dr. Donata Clay   Now requiring 24/7 Rehab RN,MD, as well as CIR level PT, OT and SLP.  Treatment team will focus on ADLs and mobility with goals set at [facility]   See Team Conference Notes for weekly updates to the plan of care

## 2015-11-08 NOTE — Progress Notes (Signed)
Physical Therapy Session Note  Patient Details  Name: [name] MRN: [id number] Date of Birth: 05-24-1961  Today's Date: 11/08/2015 PT Individual Time: 0900-1000 PT Individual Time Calculation (min): 60 min   Short Term Goals: Week 1:  PT Short Term Goal 1 (Week 1): Pt will increase bed mobility to S.  PT Short Term Goal 2 (Week 1): Pt will increase transfers to S without assistive device. PT Short Term Goal 3 (Week 1): Pt willl increase ambulation to 10 feet with LRAD and S.  PT Short Term Goal 4 (Week 1): Pt will ascend/descend 8 stairs with 2 rails and min guard.  PT Short Term Goal 5 (Week 1): Pt will increase standing balance to S for functional mobility.   Skilled Therapeutic Interventions/Progress Updates:    Pt received seated in recliner, initially declining therapy however agreeable with encouragement and education regarding purpose of therapy. Pt reports "If my vision would get better I would be fine, my balance and everything is fine". Discussed Ambulation to/from therapy gym x200' with RW and S. Stairs 2 x 8 stairs 6-inch height, third trial x4 stairs due to fatigue. All performed with close S faded to distant S by last trial. Extended seated rest breaks between trials due to fatigue; utilized rest breaks to educate pt on energy conservation, graded increase in activity for improving endurance. Ambulation to return to room with S; 2 short standing rest breaks during trial due to fatigue. Sit <>stand x10 reps for LE strengthening and aerobic endurance. Remained seated in recliner at completion of session, all needs within reach.   Therapy Documentation Precautions:  Precautions Precautions: Fall, Sternal Restrictions Weight Bearing Restrictions: Yes (Sternal precautions) Other Position/Activity Restrictions: Sternal precautions Pain: Pain Assessment Pain Assessment: No/denies pain Pain Score: 0-No pain   See Function Navigator for Current Functional  Status.   Therapy/Group: Individual Therapy  Vista Lawman 11/08/2015, 12:12 PM

## 2015-11-08 NOTE — Progress Notes (Signed)
Trish Mage, RN Rehab Admission Coordinator Signed Physical Medicine and Rehabilitation PMR Pre-admission 11/05/2015 2:01 PM  Related encounter: ED to Hosp-Admission (Discharged) from 10/25/2015 in [facility] 2W CARDIAC UNIT    Expand All Collapse All   PMR Admission Coordinator Pre-Admission Assessment  Patient: [name] is an 55 y.o., male MRN: [id number] DOB: 1961-04-27 Height: 5\' 7"  (170.2 cm) Weight: 109.135 kg (240 lb 9.6 oz)  Insurance Information Self pay - no insurance  Medicaid Application Date: Case Manager:  Disability Application Date: Case Worker:   Emergency Conservator, museum/gallery Information    Name Relation Home Work Mobile   [place] Mother [phone]       Current Medical History  Patient Admitting Diagnosis: Right PCA infarct, s/p aortic dissection repair   History of Present Illness: A 55 y.o. right handed male with history of hypertension , tobacco and polysubstance abuse, poor medical compliance as well as known history of type B descending thoracic aortic dissection diagnosed April 2016. Patient lives with his mother independent prior to admission. Mother can assist. One level home with ramped entrance. Presented 10/25/2015 with left hemianopsia headache and some right shoulder discomfort. CT of the head showed hypodense cortical lesion with mild edema and mass effect in the medial right occipital lobe. CTA of head and neck showed extensive dissection of thoracic aorta. No flow demonstrated in the proximal right vertebral artery. No intracranial large or medium vessel occlusion demonstrated. Echocardiogram with ejection fraction of 65% grade 1 diastolic dysfunction. Carotid Dopplers with no ICA stenosis. Urine drug screen positive cocaine. Patient did not  receive TPA. Cardiothoracic surgery consulted underwent repair of ascending aortic dissection with replacement of the ascending aorta and reconstruction as well as placement of intra-aortic balloon pump 10/28/2015 per Dr. Donata Clay. Hospital course pain management. Maintained on aspirin for CVA prophylaxis. Subcutaneous Lovenox for DVT prophylaxis. Tolerating a regular diet. Acute blood loss anemia 10.3 and monitored. Psychiatry services consulted for chronic substance abuse recommends outpatient substance abuse counseling. Physical occupational therapy evaluations completed and ongoing with recommendations of physical medicine rehabilitation consult.   Total: 2=NIH  Past Medical History  Past Medical History  Diagnosis Date  . Malignant hypertension Dx 2016  . Aortic dissection, thoracic (HCC)     Family History  family history includes Dementia in his father; Diabetes in his father; Heart disease in his father.  Prior Rehab/Hospitalizations: No previous rehab admissions.  Has the patient had major surgery during 100 days prior to admission? No  Current Medications   Current facility-administered medications:  . 0.9 % sodium chloride infusion, 250 mL, Intravenous, Continuous, Wayne E Gold, PA-C . ALPRAZolam Prudy Feeler) tablet 0.25 mg, 0.25 mg, Oral, BID PRN, Kerin Perna, MD, 0.25 mg at 11/02/15 2107 . amiodarone (PACERONE) tablet 200 mg, 200 mg, Oral, BID, Kerin Perna, MD, 200 mg at 11/05/15 1021 . antiseptic oral rinse (CPC / CETYLPYRIDINIUM CHLORIDE 0.05%) solution 7 mL, 7 mL, Mouth Rinse, BID, Kerin Perna, MD, 7 mL at 11/05/15 1000 . aspirin EC tablet 325 mg, 325 mg, Oral, Daily, 325 mg at 11/05/15 1021 **OR** aspirin chewable tablet 324 mg, 324 mg, Per Tube, Daily, Wayne E Gold, PA-C, 324 mg at 10/30/15 0950 . bisacodyl (DULCOLAX) EC tablet 10 mg, 10 mg, Oral, Daily, 10 mg at 11/05/15 1020 **OR** bisacodyl (DULCOLAX) suppository 10 mg, 10 mg, Rectal, Daily,  Wayne E Gold, PA-C, 10 mg at 10/29/15 1600 . budesonide-formoterol (SYMBICORT) 160-4.5 MCG/ACT inhaler 2 puff, 2 puff, Inhalation,  BID, Kerin Perna, MD, 2 puff at 11/05/15 [phone] . cloNIDine (CATAPRES) tablet 0.2 mg, 0.2 mg, Oral, BID, Kerin Perna, MD, 0.2 mg at 11/05/15 1021 . dextromethorphan-guaiFENesin (MUCINEX DM) 30-600 MG per 12 hr tablet 1 tablet, 1 tablet, Oral, BID, Kerin Perna, MD, 1 tablet at 11/05/15 1020 . docusate sodium (COLACE) capsule 200 mg, 200 mg, Oral, Daily, Wayne E Gold, PA-C, 200 mg at 11/05/15 1020 . enoxaparin (LOVENOX) injection 40 mg, 40 mg, Subcutaneous, Q24H, Kerin Perna, MD, 40 mg at 11/05/15 1022 . feeding supplement (BOOST / RESOURCE BREEZE) liquid 1 Container, 237 mL, Oral, TID WC, Kerin Perna, MD, 1 Container at 11/05/15 1230 . furosemide (LASIX) tablet 40 mg, 40 mg, Oral, Daily, Kerin Perna, MD, 40 mg at 11/05/15 1022 . levalbuterol (XOPENEX) nebulizer solution 1.25 mg, 1.25 mg, Nebulization, Q6H PRN, Kerin Perna, MD . ondansetron [facility]) injection 4 mg, 4 mg, Intravenous, Q6H PRN, Wayne E Gold, PA-C . oxyCODONE (Oxy IR/ROXICODONE) immediate release tablet 5 mg, 5 mg, Oral, Q3H PRN, Kerin Perna, MD, 5 mg at 11/05/15 1436 . pantoprazole (PROTONIX) EC tablet 40 mg, 40 mg, Oral, Daily, Wayne E Gold, PA-C, 40 mg at 11/05/15 1021 . polyethylene glycol (MIRALAX / GLYCOLAX) packet 17 g, 17 g, Oral, Daily, Erin R Barrett, PA-C, 17 g at 11/05/15 0815 . potassium chloride SA (K-DUR,KLOR-CON) CR tablet 20 mEq, 20 mEq, Oral, BID, Kerin Perna, MD, 20 mEq at 11/05/15 1022 . sodium chloride flush (NS) 0.9 % injection 10-40 mL, 10-40 mL, Intracatheter, Q12H, Kerin Perna, MD, 10 mL at 11/04/15 1036 . sodium chloride flush (NS) 0.9 % injection 10-40 mL, 10-40 mL, Intracatheter, PRN, Kerin Perna, MD, 10 mL at 11/05/15 [phone] . thiamine (VITAMIN B-1) tablet 100 mg, 100 mg, Oral, Daily, Lynita Lombard [place], RPH, 100 mg at 11/05/15  1021 . traMADol (ULTRAM) tablet 50-100 mg, 50-100 mg, Oral, Q4H PRN, Wayne E Gold, PA-C, 100 mg at 11/01/15 5409  Patients Current Diet: Diet heart healthy/carb modified Room service appropriate?: Yes; Fluid consistency:: Thin  Precautions / Restrictions Precautions Precautions: Fall, Sternal Precaution Comments: Reviewed sternal precautions.  Restrictions Weight Bearing Restrictions: Yes (sternal precautions) Other Position/Activity Restrictions: Sternal precautions   Has the patient had 2 or more falls or a fall with injury in the past year?Yes. Has had at least 3 falls with bruising.  Prior Activity Level Community (5-7x/wk): Went out daily. Worked as an Personnel officer.  Home Assistive Devices / Equipment Home Assistive Devices/Equipment: None Home Equipment: Walker - 4 wheels  Prior Device Use: Indicate devices/aids used by the patient prior to current illness, exacerbation or injury? None  Prior Functional Level Prior Function Level of Independence: Independent Comments: Patient works as Barista Care: Did the patient need help bathing, dressing, using the toilet or eating? Independent  Indoor Mobility: Did the patient need assistance with walking from room to room (with or without device)? Independent  Stairs: Did the patient need assistance with internal or external stairs (with or without device)? Independent  Functional Cognition: Did the patient need help planning regular tasks such as shopping or remembering to take medications? Independent  Current Functional Level Cognition  Overall Cognitive Status: Impaired/Different from baseline Orientation Level: Oriented X4 Safety/Judgement: Decreased awareness of safety   Extremity Assessment (includes Sensation/Coordination)  Upper Extremity Assessment: Overall WFL for tasks assessed (grossly)  Lower Extremity Assessment: Defer to PT evaluation    ADLs  Overall ADL's : Needs  assistance/impaired Eating/Feeding:  Independent, Sitting Eating/Feeding Details (indicate cue type and reason): pt turned plate to help him see all of the contents Grooming: Wash/dry hands, Minimal assistance, Standing Grooming Details (indicate cue type and reason): assist to locate soap and towels on L side, cues for aligning walker with sink Upper Body Bathing: Sitting, Minimal assitance Lower Body Bathing: Minimal assistance, Sit to/from stand Lower Body Bathing Details (indicate cue type and reason): unable to reach R foot Upper Body Dressing : Set up, Sitting Upper Body Dressing Details (indicate cue type and reason): changed soiled gown Lower Body Dressing: Minimal assistance, Sit to/from stand Lower Body Dressing Details (indicate cue type and reason): unable to reach R foot, can cross L foot over opposite knee to don and doff sock Toilet Transfer: Minimal assistance, Ambulation, RW Toileting- Clothing Manipulation and Hygiene: Minimal assistance, Sit to/from stand Toileting - Clothing Manipulation Details (indicate cue type and reason): LOB with pericare Functional mobility during ADLs: Rolling walker, Moderate assistance    Mobility  Overal bed mobility: Needs Assistance Bed Mobility: Sit to Sidelying Rolling: Min assist Sidelying to sit: Max assist Supine to sit: Supervision, HOB elevated Sit to supine: Max assist, +2 for physical assistance Sit to sidelying: Supervision General bed mobility comments: cues for technique    Transfers  Overall transfer level: Needs assistance Equipment used: Rolling walker (2 wheeled) Transfers: Sit to/from Stand Sit to Stand: Min assist Stand pivot transfers: Min assist, +2 physical assistance General transfer comment: increased time, cues to use momentum for decreased UE support    Ambulation / Gait / Stairs / Wheelchair Mobility  Ambulation/Gait Ambulation/Gait assistance: Mod assist Ambulation Distance (Feet): 150 Feet (x  2) Assistive device: Rolling walker (2 wheeled) Gait Pattern/deviations: Step-to pattern, Shuffle General Gait Details: initially keeping R pelvis retracted, facilitation provided for R pelvic protraction, cues and assist for attending to obstacles on L side Gait velocity: decreased Gait velocity interpretation: Below normal speed for age/gender    Posture / Balance Balance Overall balance assessment: Needs assistance Sitting-balance support: Feet supported Sitting balance-Leahy Scale: Fair Standing balance support: During functional activity Standing balance-Leahy Scale: Poor Standing balance comment: UE support for balance due to weakness    Special needs/care consideration BiPAP/CPAP No CPM No Continuous Drip IV No Dialysis No  Life Vest No Oxygen No Special Bed No Trach Size No Wound Vac (area) No  Skin NO  Bowel mgmt: Last BM 11/04/15 Bladder mgmt: Voiding in the urinal Diabetic mgmt No    Previous Home Environment Living Arrangements: Parent Available Help at Discharge: Family, Available PRN/intermittently Type of Home: House Home Layout: One level Home Access: Stairs to enter, Ramped entrance Entrance Stairs-Rails: Right, Left Entrance Stairs-Number of Steps: 4 Bathroom Shower/Tub: Engineer, manufacturing systems: Handicapped height Home Care Services: No  Discharge Living Setting Plans for Discharge Living Setting: House, Lives with (comment) (Lives with mother.) Type of Home at Discharge: House Discharge Home Layout: One level Discharge Home Access: Stairs to enter, Level entry Entrance Stairs-Number of Steps: 4 steps at front and ramp at the back entry Does the patient have any problems obtaining your medications?: No  Social/Family/Support Systems Patient Roles: Other (Comment) (Has a mom.) Has a son who lives in [place]. Contact Information: Fabienne Bruns - mother Anticipated Caregiver:  mom Anticipated Caregiver's Contact Information: Stefanie Libel - [phone] Ability/Limitations of Caregiver: Mom can assist. Caregiver Availability: Intermittent Discharge Plan Discussed with Primary Caregiver: Yes Is Caregiver In Agreement with Plan?: Yes  Goals/Additional Needs Patient/Family Goal for Rehab: PT/OT  mod I goals Expected length of stay: 7-10 days Cultural Considerations: None Dietary Needs: Heart healthy, carb modified, thin liquids Equipment Needs: TBD Pt/Family Agrees to Admission and willing to participate: Yes Program Orientation Provided & Reviewed with Pt/Caregiver Including Roles & Responsibilities: Yes  Decrease burden of Care through IP rehab admission: N/A  Possible need for SNF placement upon discharge: No  Patient Condition: This patient's condition remains as documented in the consult dated 11/05/15, in which the Rehabilitation Physician determined and documented that the patient's condition is appropriate for intensive rehabilitative care in an inpatient rehabilitation facility. Frank admit to inpatient rehab today.  Preadmission Screen Completed By: Trish Mage, 11/05/2015 3:03 PM ______________________________________________________________________  Discussed status with Dr. Riley Kill on 11/05/15 at 1502 and received telephone approval for admission today.  Admission Coordinator: Trish Mage, time1502/Date02/24/17          Cosigned by: [name], MD at 11/05/2015 3:27 PM  Revision History     Date/Time User Provider Type Action   11/05/2015 3:27 PM [name], MD Physician Cosign   11/05/2015 3:03 PM Trish Mage, RN Rehab Admission Coordinator Sign

## 2015-11-08 NOTE — Progress Notes (Signed)
Patient information reviewed and entered into eRehab System by Becky Lathon Adan, covering PPS coordinator. Information including medical coding and functional independence measure will be reviewed and updated through discharge.   

## 2015-11-08 NOTE — Care Management Note (Signed)
Inpatient Rehabilitation Center Individual Statement of Services  Patient Name:  [name]  Date:  11/08/2015  Welcome to the Inpatient Rehabilitation Center.  Our goal is to provide you with an individualized program based on your diagnosis and situation, designed to meet your specific needs.  With this comprehensive rehabilitation program, you will be expected to participate in at least 3 hours of rehabilitation therapies Monday-Friday, with modified therapy programming on the weekends.  Your rehabilitation program will include the following services:  Physical Therapy (PT), Occupational Therapy (OT), Speech Therapy (ST), 24 hour per day rehabilitation nursing, Neuropsychology, Case Management (Social Worker), Rehabilitation Medicine, Nutrition Services and Pharmacy Services  Weekly team conferences will be held on Wednesday to discuss your progress.  Your Social Worker will talk with you frequently to get your input and to update you on team discussions.  Team conferences with you and your family in attendance may also be held.  Expected length of stay: 7-10 days Overall anticipated outcome: mod/i-supervision level  Depending on your progress and recovery, your program may change. Your Social Worker will coordinate services and will keep you informed of any changes. Your Social Worker's name and contact numbers are listed  below.  The following services may also be recommended but are not provided by the Inpatient Rehabilitation Center:   Driving Evaluations  Home Health Rehabiltiation Services  Outpatient Rehabilitation Services  Vocational Rehabilitation   Arrangements will be made to provide these services after discharge if needed.  Arrangements include referral to agencies that provide these services.  Your insurance has been verified to be:  Pending Medicaid Your primary doctor is:  [facility]  Pertinent information will be shared with  your doctor and your insurance company.  Social Worker:  Dossie Der, SW [phone] or [phone]  Information discussed with and copy given to patient by: Lucy Chris, 11/08/2015, 1:16 PM

## 2015-11-08 NOTE — Progress Notes (Signed)
Social Work  Social Work Assessment and Plan  Patient Details  Name: [name] MRN: [id number] Date of Birth: Dec 27, 1960  Today's Date: 11/08/2015  Problem List:  Patient Active Problem List   Diagnosis Date Noted  . Acute ischemic right PCA stroke (HCC) 11/05/2015  . Left homonymous hemianopsia   . Aortic dissection, thoracic (HCC) 10/28/2015  . Cerebral thrombosis with cerebral infarction 10/26/2015  . Aortic dissection (HCC) 10/25/2015  . Osteoarthritis of right hip 03/01/2015  . Aortic dissection, thoracoabdominal (HCC) 03/01/2015  . Vitamin D deficiency 01/28/2015  . Nonspecific abnormal electrocardiogram (ECG) (EKG) 01/26/2015  . Pre-diabetes 01/13/2015  . Benign paroxysmal positional vertigo 01/12/2015  . Alcohol abuse 01/07/2015  . Cocaine abuse 01/07/2015  . Tobacco abuse 01/07/2015  . Essential hypertension    Past Medical History:  Past Medical History  Diagnosis Date  . Malignant hypertension Dx 2016  . Aortic dissection, thoracic [facility])    Past Surgical History:  Past Surgical History  Procedure Laterality Date  . Rectal abscess    . Thoracic aortic aneurysm repair N/A 10/28/2015    Procedure: TYPE A AORTIC DISSECTION REPAIR WITH CIRC ARREST;  Surgeon: Kerin Perna, MD;  Location: [facility] OR;  Service: Open Heart Surgery;  Laterality: N/A;  . Tee without cardioversion N/A 10/28/2015    Procedure: TRANSESOPHAGEAL ECHOCARDIOGRAM (TEE);  Surgeon: Kerin Perna, MD;  Location: [facility] OR;  Service: Open Heart Surgery;  Laterality: N/A;   Social History:  reports that he has been smoking Cigarettes.  He does not have any smokeless tobacco history on file. He reports that he does not drink alcohol or use illicit drugs.  Family / Support Systems Marital Status: Divorced Patient Roles: Other (Comment) (employee) Other Supports: Mary-Mom  [phone] Anticipated Caregiver: Patient and Mom Ability/Limitations of Caregiver: Mom can not provide 24 hr care-close  though Caregiver Availability: Other (Comment) (Almost 24 hr care-if needed) Family Dynamics: Pt has his Mom, brother along with son who are local. He relies upon himself and has been independent for all of his life. He feels blessed to have his family if he needs them he knows they will be there for him. He has his church family also, whom has visited him here.  Social History Preferred language: English Religion:  Cultural Background: No issues Education: Trade School Read: Yes Write: Yes Employment Status: Employed Name of Employer: self employed-electrician Return to Work Plans: Plans to return to work when recovered from this stroke and heart surgery Legal Hisotry/Current Legal Issues: No issues Guardian/Conservator: None-according to MD pt is capable of making his own decisions while here.   Abuse/Neglect Physical Abuse: Denies Verbal Abuse: Denies Sexual Abuse: Denies Exploitation of patient/patient's resources: Denies Self-Neglect: Denies  Emotional Status Pt's affect, behavior adn adjustment status: Pt is very thankful he has done so well after his surgery. He realizes how serious it was and how it may have not turned out so well for him. He has been healthy and independent even with some of his poor life choices. He feels he has a new lease on life and is going to take advantage of it. Recent Psychosocial Issues: other health issues-needs to be managed better, would not always go to the MD Pyschiatric History: No history deferred depression screen due to pt feeling very positive and feels he has a second chance this time to get it right. He is positive and optimistic regarding his recovery from his surgery and stroke. Will monitor him and have neuro-psych see if  needed.  Feel will be a short length of stay. Substance Abuse History: Tobacco & Cocaine-aware of the risks he is taking and now plans to quit cocaine-feels with God's help he wil overcome this. He does not plan to waste  this second chance. Discussed if wanted information or resources he will think about it.  Patient / Family Perceptions, Expectations & Goals Pt/Family understanding of illness & functional limitations: Pt and Mom can explain his surgery and stroke as a result, he is recovering and doing well. Both are very pleased with his progress and feel the MD's have been very informative and their questions have ben addressed. 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[facility]) Premorbid Home Care/DME Agencies: None Transportation available at discharge: Self, Mom and brother Resource referrals recommended: Neuropsychology, Support group (specify)  Discharge Planning Living Arrangements: Parent Support Systems: Parent, Friends/neighbors, Psychologist, clinical community, Other relatives Type of Residence: Private residence Insurance Resources: Self-pay (Medicaid pending) Financial Resources: Employment, Garment/textile technologist Screen Referred: Previously completed Living Expenses: Lives with family Money Management: Patient Does the patient have any problems obtaining your medications?: No (Connected to the Clinic and gets medications for free-reduced rate) Home Management: Mom Patient/Family Preliminary Plans: Retrun to MOm's home she can be there some if needed. Pt should get to the level he can stay alone if needed. Discussed his substance abuse and he will think about services-he feels he has been given another chance and plans not to blow it. He is aware how his drug use has lead to many losses in his life. Social  Work Anticipated Follow Up Needs: HH/OP, Support Group  Clinical Impression Pleasant gentleman who is looking forward to his future and second chance he has been given. He realizes he will need to change his lifestyle habits and get off the drugs-cocaine. He feels he can live without it he has for the time he has been in the hospital. He is thinking about if he wants OP follow up for this. He is doing well in therapies and will not be here long due to his high level. He does have supportive Mom and brother. May benefit from neuro-psych seeing prior to discharge from rehab. Work on a safe discharge plan.  Lucy Chris 11/08/2015, 2:02 PM

## 2015-11-08 NOTE — Progress Notes (Signed)
Subjective/Complaints:   Objective: Vital Signs: Blood pressure 115/59, pulse 83, temperature 97.7 F (36.5 C), temperature source Oral, resp. rate 18, weight 108.6 kg (239 lb 6.7 oz), SpO2 96 %. No results found. Results for orders placed or performed during the hospital encounter of 11/05/15 (from the past 72 hour(s))  CBC     Status: Abnormal   Collection Time: 11/05/15  6:25 PM  Result Value Ref Range   WBC 14.6 (H) 4.0 - 10.5 K/uL   RBC 4.15 (L) 4.22 - 5.81 MIL/uL   Hemoglobin 10.3 (L) 13.0 - 17.0 g/dL   HCT 32.5 (L) 39.0 - 52.0 %   MCV 78.3 78.0 - 100.0 fL   MCH 24.8 (L) 26.0 - 34.0 pg   MCHC 31.7 30.0 - 36.0 g/dL   RDW 13.7 11.5 - 15.5 %   Platelets 558 (H) 150 - 400 K/uL  Creatinine, serum     Status: None   Collection Time: 11/05/15  6:25 PM  Result Value Ref Range   Creatinine, Ser 0.92 0.61 - 1.24 mg/dL   GFR calc non Af Amer >60 >60 mL/min   GFR calc Af Amer >60 >60 mL/min    Comment: (NOTE) The eGFR has been calculated using the CKD EPI equation. This calculation has not been validated in all clinical situations. eGFR's persistently <60 mL/min signify possible Chronic Kidney Disease.   CBC WITH DIFFERENTIAL     Status: Abnormal   Collection Time: 11/08/15  6:53 AM  Result Value Ref Range   WBC 12.5 (H) 4.0 - 10.5 K/uL   RBC 4.32 4.22 - 5.81 MIL/uL   Hemoglobin 10.7 (L) 13.0 - 17.0 g/dL   HCT 34.3 (L) 39.0 - 52.0 %   MCV 79.4 78.0 - 100.0 fL   MCH 24.8 (L) 26.0 - 34.0 pg   MCHC 31.2 30.0 - 36.0 g/dL   RDW 13.9 11.5 - 15.5 %   Platelets 642 (H) 150 - 400 K/uL   Neutrophils Relative % 64 %   Neutro Abs 8.0 (H) 1.7 - 7.7 K/uL   Lymphocytes Relative 24 %   Lymphs Abs 2.9 0.7 - 4.0 K/uL   Monocytes Relative 10 %   Monocytes Absolute 1.2 (H) 0.1 - 1.0 K/uL   Eosinophils Relative 2 %   Eosinophils Absolute 0.3 0.0 - 0.7 K/uL   Basophils Relative 0 %   Basophils Absolute 0.1 0.0 - 0.1 K/uL  Comprehensive metabolic panel     Status: Abnormal   Collection  Time: 11/08/15  6:53 AM  Result Value Ref Range   Sodium 139 135 - 145 mmol/L   Potassium 4.9 3.5 - 5.1 mmol/L   Chloride 100 (L) 101 - 111 mmol/L   CO2 25 22 - 32 mmol/L   Glucose, Bld 106 (H) 65 - 99 mg/dL   BUN 20 6 - 20 mg/dL   Creatinine, Ser 1.03 0.61 - 1.24 mg/dL   Calcium 8.8 (L) 8.9 - 10.3 mg/dL   Total Protein 6.9 6.5 - 8.1 g/dL   Albumin 2.8 (L) 3.5 - 5.0 g/dL   AST 24 15 - 41 U/L   ALT 28 17 - 63 U/L   Alkaline Phosphatase 70 38 - 126 U/L   Total Bilirubin 0.3 0.3 - 1.2 mg/dL   GFR calc non Af Amer >60 >60 mL/min   GFR calc Af Amer >60 >60 mL/min    Comment: (NOTE) The eGFR has been calculated using the CKD EPI equation. This calculation has not been validated in  all clinical situations. eGFR's persistently <60 mL/min signify possible Chronic Kidney Disease.    Anion gap 14 5 - 15      General: No acute distress Mood and affect are appropriate Heart: Regular rate and rhythm no rubs murmurs or extra sounds Lungs: Clear to auscultation, breathing unlabored, no rales or wheezes Abdomen: Positive bowel sounds, soft nontender to palpation, nondistended Extremities: No clubbing, cyanosis, or edema Skin: No evidence of breakdown, no evidence of rash, healing sternotomy wound Neurologic: Cranial nerves II through XII intact, motor strength is 5/5 in bilateral deltoid, bicep, tricep, grip, hip flexor, knee extensors, ankle dorsiflexor and plantar flexor, visual fields Left lower quadrantanopsia Sensory exam normal sensation to light touch and proprioception in bilateral upper and lower extremities Cerebellar exam normal finger to nose to finger as well as heel to shin in bilateral upper and lower extremities Musculoskeletal: Full range of motion in all 4 extremities. No joint swelling   Assessment/Plan: 1. Functional deficits secondary to Left homonymous hemianopsia, gait disorder which require 3+ hours per day of interdisciplinary therapy in a comprehensive inpatient  rehab setting. Physiatrist is providing close team supervision and 24 hour management of active medical problems listed below. Physiatrist and rehab team continue to assess barriers to discharge/monitor patient progress toward functional and medical goals. FIM: Function - Bathing Position: Shower Body parts bathed by patient: Right arm, Right upper leg, Left upper leg, Left arm, Chest, Abdomen, Front perineal area, Left lower leg, Buttocks Body parts bathed by helper: Right lower leg, Back Assist Level: Touching or steadying assistance(Pt > 75%)  Function- Upper Body Dressing/Undressing What is the patient wearing?: Pull over shirt/dress Pull over shirt/dress - Perfomed by patient: Thread/unthread right sleeve, Thread/unthread left sleeve, Put head through opening, Pull shirt over trunk Assist Level: Set up Set up : To obtain clothing/put away Function - Lower Body Dressing/Undressing What is the patient wearing?: Underwear, Pants, Non-skid slipper socks Position: Wheelchair/chair at sink Underwear - Performed by patient: Thread/unthread right underwear leg, Thread/unthread left underwear leg, Pull underwear up/down Pants- Performed by patient: Thread/unthread right pants leg, Thread/unthread left pants leg, Pull pants up/down, Fasten/unfasten pants Non-skid slipper socks- Performed by patient: Don/doff left sock Non-skid slipper socks- Performed by helper: Don/doff right sock Assist for footwear: Supervision/touching assist Assist for lower body dressing: Touching or steadying assistance (Pt > 75%)  Function - Toileting Toileting steps completed by patient: Adjust clothing prior to toileting Toileting steps completed by helper: Performs perineal hygiene, Adjust clothing after toileting Toileting Assistive Devices: Grab bar or rail Assist level: Touching or steadying assistance (Pt.75%)  Function - Air cabin crew transfer assistive device: Walker, Elevated toilet seat/BSC  over toilet Assist level to toilet: Touching or steadying assistance (Pt > 75%) Assist level from toilet: Touching or steadying assistance (Pt > 75%)  Function - Chair/bed transfer Chair/bed transfer method: Stand pivot, Ambulatory Chair/bed transfer assist level: Touching or steadying assistance (Pt > 75%)  Function - Locomotion: Wheelchair Will patient use wheelchair at discharge?: No Type: Manual Max wheelchair distance: 150 Assist Level: Supervision or verbal cues Assist Level: Supervision or verbal cues Assist Level: Supervision or verbal cues Function - Locomotion: Ambulation Assistive device: No device Max distance: 50 Assist level: Touching or steadying assistance (Pt > 75%) Assist level: Touching or steadying assistance (Pt > 75%) Assist level: Touching or steadying assistance (Pt > 75%) Walk 150 feet activity did not occur: Safety/medical concerns Walk 10 feet on uneven surfaces activity did not occur: Safety/medical concerns  Function -  Comprehension Comprehension: Auditory Comprehension assist level: Understands basic 75 - 89% of the time/ requires cueing 10 - 24% of the time  Function - Expression Expression: Verbal Expression assist level: Expresses basic 90% of the time/requires cueing < 10% of the time.  Function - Social Interaction Social Interaction assist level: Interacts appropriately 90% of the time - Needs monitoring or encouragement for participation or interaction.  Function - Problem Solving Problem solving assist level: Solves basic 75 - 89% of the time/requires cueing 10 - 24% of the time  Function - Memory Memory assist level: Recognizes or recalls 90% of the time/requires cueing < 10% of the time Patient normally able to recall (first 3 days only): Current season, That he or she is in a hospital  Medical Problem List and Plan: 1. Left hemianopsia, gait deficits, and weakness secondary to right PCA infarct/ status post aortic dissection repair-  discussed Left homonymous hemianopsia 2. DVT Prophylaxis/Anticoagulation: Subcutaneous Lovenox. Monitor platelet counts of any signs of bleeding 3. Pain Management: Oxycodone and as needed -pt with chronic right hip OA and related pain, reviewed Xray 02/24/2015 severe jt space loss  4.Hypertension/Atrial fib.Amiodarone 200 mg BID,Clonidine 0.2 mg BID,Lasix 40 mg daily.Monitor with increased mobility. 115/57, monitor for dizziness, check orthostatic vitals 5. Tobacco /Polysubstance abuse. Counseling LOS (Days) 3 A FACE TO FACE EVALUATION WAS PERFORMED  Johnae Friley E 11/08/2015, 8:56 AM

## 2015-11-09 ENCOUNTER — Inpatient Hospital Stay (HOSPITAL_COMMUNITY): Payer: Medicaid Other | Admitting: Physical Therapy

## 2015-11-09 ENCOUNTER — Inpatient Hospital Stay (HOSPITAL_COMMUNITY): Payer: Medicaid Other | Admitting: Occupational Therapy

## 2015-11-09 MED ORDER — MUSCLE RUB 10-15 % EX CREA
TOPICAL_CREAM | CUTANEOUS | Status: DC | PRN
  Filled 2015-11-09: qty 85

## 2015-11-09 NOTE — Progress Notes (Signed)
Subjective/Complaints: No issues overnite.  Right hip flares with certain activity but didn't affect therpay per PT,  Objective: Vital Signs: Blood pressure 116/58, pulse 88, temperature 98.1 F (36.7 C), temperature source Oral, resp. rate 18, weight 107 kg (235 lb 14.3 oz), SpO2 95 %. No results found. Results for orders placed or performed during the hospital encounter of 11/05/15 (from the past 72 hour(s))  CBC WITH DIFFERENTIAL     Status: Abnormal   Collection Time: 11/08/15  6:53 AM  Result Value Ref Range   WBC 12.5 (H) 4.0 - 10.5 K/uL   RBC 4.32 4.22 - 5.81 MIL/uL   Hemoglobin 10.7 (L) 13.0 - 17.0 g/dL   HCT 34.3 (L) 39.0 - 52.0 %   MCV 79.4 78.0 - 100.0 fL   MCH 24.8 (L) 26.0 - 34.0 pg   MCHC 31.2 30.0 - 36.0 g/dL   RDW 13.9 11.5 - 15.5 %   Platelets 642 (H) 150 - 400 K/uL   Neutrophils Relative % 64 %   Neutro Abs 8.0 (H) 1.7 - 7.7 K/uL   Lymphocytes Relative 24 %   Lymphs Abs 2.9 0.7 - 4.0 K/uL   Monocytes Relative 10 %   Monocytes Absolute 1.2 (H) 0.1 - 1.0 K/uL   Eosinophils Relative 2 %   Eosinophils Absolute 0.3 0.0 - 0.7 K/uL   Basophils Relative 0 %   Basophils Absolute 0.1 0.0 - 0.1 K/uL  Comprehensive metabolic panel     Status: Abnormal   Collection Time: 11/08/15  6:53 AM  Result Value Ref Range   Sodium 139 135 - 145 mmol/L   Potassium 4.9 3.5 - 5.1 mmol/L   Chloride 100 (L) 101 - 111 mmol/L   CO2 25 22 - 32 mmol/L   Glucose, Bld 106 (H) 65 - 99 mg/dL   BUN 20 6 - 20 mg/dL   Creatinine, Ser 1.03 0.61 - 1.24 mg/dL   Calcium 8.8 (L) 8.9 - 10.3 mg/dL   Total Protein 6.9 6.5 - 8.1 g/dL   Albumin 2.8 (L) 3.5 - 5.0 g/dL   AST 24 15 - 41 U/L   ALT 28 17 - 63 U/L   Alkaline Phosphatase 70 38 - 126 U/L   Total Bilirubin 0.3 0.3 - 1.2 mg/dL   GFR calc non Af Amer >60 >60 mL/min   GFR calc Af Amer >60 >60 mL/min    Comment: (NOTE) The eGFR has been calculated using the CKD EPI equation. This calculation has not been validated in all clinical  situations. eGFR's persistently <60 mL/min signify possible Chronic Kidney Disease.    Anion gap 14 5 - 15      General: No acute distress Mood and affect are appropriate Heart: Regular rate and rhythm no rubs murmurs or extra sounds Lungs: Clear to auscultation, breathing unlabored, no rales or wheezes Abdomen: Positive bowel sounds, soft nontender to palpation, nondistended Extremities: No clubbing, cyanosis, or edema Skin: No evidence of breakdown, no evidence of rash, healing sternotomy wound Neurologic: Cranial nerves II through XII intact, motor strength is 5/5 in bilateral deltoid, bicep, tricep, grip, hip flexor, knee extensors, ankle dorsiflexor and plantar flexor, visual fields Left lower quadrantanopsia Sensory exam normal sensation to light touch and proprioception in bilateral upper and lower extremities Cerebellar exam normal finger to nose to finger as well as heel to shin in bilateral upper and lower extremities Musculoskeletal: Full range of motion in all 4 extremities. No joint swelling   Assessment/Plan: 1. Functional deficits secondary to  Left homonymous hemianopsia, gait disorder which require 3+ hours per day of interdisciplinary therapy in a comprehensive inpatient rehab setting. Physiatrist is providing close team supervision and 24 hour management of active medical problems listed below. Physiatrist and rehab team continue to assess barriers to discharge/monitor patient progress toward functional and medical goals. FIM: Function - Bathing Position: Shower Body parts bathed by patient: Right arm, Right upper leg, Left upper leg, Left arm, Chest, Abdomen, Front perineal area, Left lower leg, Buttocks, Right lower leg Body parts bathed by helper: Back Assist Level: Set up Set up : To obtain items  Function- Upper Body Dressing/Undressing What is the patient wearing?: Pull over shirt/dress Pull over shirt/dress - Perfomed by patient: Thread/unthread right  sleeve, Thread/unthread left sleeve, Put head through opening, Pull shirt over trunk Assist Level: Set up Set up : To obtain clothing/put away Function - Lower Body Dressing/Undressing What is the patient wearing?: Pants, Non-skid slipper socks Position:  (recliner) Underwear - Performed by patient: Thread/unthread right underwear leg, Thread/unthread left underwear leg, Pull underwear up/down Pants- Performed by patient: Thread/unthread right pants leg, Thread/unthread left pants leg, Pull pants up/down, Fasten/unfasten pants Non-skid slipper socks- Performed by patient: Don/doff right sock, Don/doff left sock Non-skid slipper socks- Performed by helper: Don/doff right sock Assist for footwear: Supervision/touching assist Assist for lower body dressing: Supervision or verbal cues, Set up, Assistive device Assistive Device Comment: sock aid Set up : To obtain clothing/put away  Function - Toileting Toileting steps completed by patient: Adjust clothing prior to toileting Toileting steps completed by helper: Performs perineal hygiene, Adjust clothing after toileting Toileting Assistive Devices: Grab bar or rail Assist level: Touching or steadying assistance (Pt.75%)  Function - Toilet Transfers Toilet transfer assistive device: Walker, Elevated toilet seat/BSC over toilet Assist level to toilet: Supervision or verbal cues Assist level from toilet: Supervision or verbal cues  Function - Chair/bed transfer Chair/bed transfer method: Stand pivot, Ambulatory Chair/bed transfer assist level: Touching or steadying assistance (Pt > 75%)  Function - Locomotion: Wheelchair Will patient use wheelchair at discharge?: No Type: Manual Max wheelchair distance: 150 Assist Level: Supervision or verbal cues Assist Level: Supervision or verbal cues Assist Level: Supervision or verbal cues Function - Locomotion: Ambulation Assistive device: Walker-rolling Max distance: 200 Assist level:  Supervision or verbal cues Assist level: Supervision or verbal cues Assist level: Supervision or verbal cues Walk 150 feet activity did not occur: Safety/medical concerns Assist level: Supervision or verbal cues Walk 10 feet on uneven surfaces activity did not occur: Safety/medical concerns  Function - Comprehension Comprehension: Auditory Comprehension assist level: Understands basic 90% of the time/cues < 10% of the time  Function - Expression Expression: Verbal Expression assist level: Expresses basic 90% of the time/requires cueing < 10% of the time.  Function - Social Interaction Social Interaction assist level: Interacts appropriately 90% of the time - Needs monitoring or encouragement for participation or interaction.  Function - Problem Solving Problem solving assist level: Solves basic 75 - 89% of the time/requires cueing 10 - 24% of the time  Function - Memory Memory assist level: Recognizes or recalls 90% of the time/requires cueing < 10% of the time Patient normally able to recall (first 3 days only): Current season, That he or she is in a hospital  Medical Problem List and Plan: 1. Left hemianopsia, gait deficits, and weakness secondary to right PCA infarct/ status post aortic dissection repair-team conf in am 2. DVT Prophylaxis/Anticoagulation: Subcutaneous Lovenox. Monitor platelet counts of any signs of  bleeding 3. Pain Management: Oxycodone and as needed -pt with chronic right hip OA and related pain, reviewed Xray 02/24/2015 severe jt space loss  4.Hypertension/Atrial fib.Amiodarone 200 mg BID,Clonidine 0.2 mg BID,Lasix 40 mg daily.Monitor with increased mobility. 111/58, monitor for dizziness, check orthostatic vitals 5. Tobacco /Polysubstance abuse. Counseling 6.  Nutrition- 50-100% meals   LOS (Days) 4 A FACE TO FACE EVALUATION WAS PERFORMED  KIRSTEINS,ANDREW E 11/09/2015, 8:05 AM

## 2015-11-09 NOTE — Progress Notes (Signed)
Occupational Therapy Session Note  Patient Details  Name: [name] MRN: [id number] Date of Birth: [birth year]  Today's Date: 11/09/2015 OT Individual Time: 1105-1200 and 1300-1300 OT Individual Time Calculation (min): 55 min and 30 min   Short Term Goals: Week 1:  OT Short Term Goal 1 (Week 1): STG=LTG due to LOS  Skilled Therapeutic Interventions/Progress Updates:    1) ADL retraining with focus on functional mobility, increased independence and safety with self-care tasks, and Lt visual attention.  Pt ambulated to room bathroom without AD at overall supervision level.  Completed bathing at sit > stand level without assist after setup for items.  Pt able to scan to Lt to obtain towel from therapist after bathing, requiring increased time due to Lt field cut.  Utilized sock aid to don Rt sock due to limited ROM at Rt hip and pain with bending/stretching.  Pt required increased rest breaks throughout session due to impaired activity tolerance and endurance, educated on energy conservation strategies.  Further assessed vision, with pt demonstrating Lt visual field cut.  Provided further education regarding compensatory strategies and table top activities to complete between therapy sessions.  2) Treatment session with focus on Lt visual scanning.  Engaged in scanning activity during functional mobility, having pt scan Lt and Rt environments to read signs on both walls while walking down hallway.  Increased challenge to having pt walk backwards to locate colored dots on Lt wall, backwards to not allow pt to forecast and use Rt vision to see items before approaching.  Pt reports increase in Rt hip pain with walking backwards, requesting to return to the room.  Discussed room setup and importance of scanning to Lt to continue to draw attention to Lt visual field.  Left in room with RN to provide pain meds for Rt hip.  Therapy Documentation Precautions:  Precautions Precautions: Fall,  Sternal Restrictions Weight Bearing Restrictions: Yes (Sternal precautions) Other Position/Activity Restrictions: Sternal precautions Vital Signs: Oxygen Therapy SpO2: 96 % O2 Device: Not Delivered Pain:   Pt with no c/o pain in first session.  C/o pain in Rt hip during 2nd session, RN provided pain meds.  See Function Navigator for Current Functional Status.   Therapy/Group: Individual Therapy  Rosalio Loud 11/09/2015, 12:05 PM

## 2015-11-09 NOTE — Progress Notes (Signed)
Physical Therapy Session Note  Patient Details  Name: [name] MRN: [id number] Date of Birth: 1961-09-11  Today's Date: 11/09/2015 PT Individual Time: 0800-0900 and 1445-1530 PT Individual Time Calculation (min): 60 min and 45 min (total 105 min)   Short Term Goals: Week 1:  PT Short Term Goal 1 (Week 1): Pt will increase bed mobility to S.  PT Short Term Goal 2 (Week 1): Pt will increase transfers to S without assistive device. PT Short Term Goal 3 (Week 1): Pt willl increase ambulation to 10 feet with LRAD and S.  PT Short Term Goal 4 (Week 1): Pt will ascend/descend 8 stairs with 2 rails and min guard.  PT Short Term Goal 5 (Week 1): Pt will increase standing balance to S for functional mobility.   Skilled Therapeutic Interventions/Progress Updates:    Tx 1:Pt received seated on EOB with nursing staff administering medication; reports shoulder pain as described below stating it is soreness from sessions yesterday. Gait to/from gym 2x200' with RW and S. LE strengthening exercises with 2# weights including long arc quad 1 set 15 reps BLE. Pt reports he "doesn't have a problem with my legs, its my vision"; discussed endurance limitations with pt and pt agrees he does get SOB more quickly than before. Sit <>stand 2 x 10 reps from low mat table. Pt reports anxiety related to shortness of breath both with supine lying and with activity; educated on deep breathing to slow respiratory rate. Gait 2 x 175' to ortho gym with RW and S. Car transfer performed ambulatory with RW and S; pt had expressed concern about getting in and out of car, stated on eval he had a hard time with it and is fearful it will limit him in going home. Reported he felt much better after performing with S today. Standing balance with card matching task with cards placed on L side to facilitate L scanning and educated regarding compensation for L field cut. Returned to room ambulation as above; remained seated in recliner at  completion of session, all needs within reach.   Tx 2: Pt received seated in recliner with friend present, eating lunch and requesting a few minutes to finish. Upon returning to room, pt's friend was shaving his face; pt missed 15 minutes PT. Gait to/from therapy gym x200' with RW and S. Pt continues to report SOB however is demonstrating gradual improvements in gait speed and decreased assistance from CGA to S. Longitudinal traction, lateral distraction/glide (supine with belt), and inferior distraction/glide (supine with belt) grade III/IV mobilizations. Pt reports improvement in VAS pain scale from 7/10 prior to mobilizations, to 4/10 following mobilizations and ambulation back to room. Gait to laundry room and retrieved clothes from dryer with standbyA and RW for balance. Remained seated in recliner at completion of session, all needs within reach.   Therapy Documentation Precautions:  Precautions Precautions: Fall, Sternal Restrictions Weight Bearing Restrictions:  (cardiac sternal precautions) Other Position/Activity Restrictions: Sternal precautions Pain: Pain Assessment Pain Score: 4    See Function Navigator for Current Functional Status.   Therapy/Group: Individual Therapy  Vista Lawman 11/09/2015, 3:55 PM

## 2015-11-10 ENCOUNTER — Inpatient Hospital Stay (HOSPITAL_COMMUNITY): Payer: Medicaid Other | Admitting: Occupational Therapy

## 2015-11-10 ENCOUNTER — Inpatient Hospital Stay (HOSPITAL_COMMUNITY): Payer: Medicaid Other | Admitting: Physical Therapy

## 2015-11-10 MED ORDER — NAPHAZOLINE-GLYCERIN 0.012-0.2 % OP SOLN
2.0000 [drp] | Freq: Three times a day (TID) | OPHTHALMIC | Status: DC
  Filled 2015-11-10: qty 15

## 2015-11-10 MED ORDER — FUROSEMIDE 20 MG PO TABS
20.0000 mg | ORAL_TABLET | Freq: Every day | ORAL | Status: DC
  Administered 2015-11-11 – 2015-11-13 (×3): 20 mg via ORAL
  Filled 2015-11-10 (×3): qty 1

## 2015-11-10 MED ORDER — NAPHAZOLINE-GLYCERIN 0.03-0.5 % OP SOLN
2.0000 [drp] | Freq: Three times a day (TID) | OPHTHALMIC | Status: DC
  Administered 2015-11-10 – 2015-11-13 (×6): 2 [drp] via OPHTHALMIC
  Filled 2015-11-10 (×5): qty 1

## 2015-11-10 NOTE — Progress Notes (Signed)
Occupational Therapy Session Note  Patient Details  Name: [name] MRN: [id number] Date of Birth: 03/05/1961  Today's Date: 11/10/2015 OT Individual Time: 7829-5621 and 1420-1500 OT Individual Time Calculation (min): 60 min and 40 min   Short Term Goals: Week 1:  OT Short Term Goal 1 (Week 1): STG=LTG due to LOS  Skilled Therapeutic Interventions/Progress Updates:    1) ADL retraining with focus on functional mobility, transfers, LB dressing, and Lt visual attention.  Pt gathered all clothing and ambulated to room shower without AD with distant supervision.  Pt completed bathing with setup assist at sit > stand level.  Dressing completed with increased time and use of sock aid to don Rt sock due to h/o Rt hip pain.  Pt required prolonged rest break post bathing.  Discussed bathroom setup and recommendations for grab bars and shower chair.  Pt called mother who reports they did have a shower chair and she would look for it.  Engaged in tub/shower transfers in ADL tubroom with pt stepping over tub ledge with use of grab bars and supervision.  Discussed placement of grab bar in shower with appropriate measurements from floor and length of grab bar and provided written dimensions.  Pt ambulated back to room >250 feet with distant supervision.  2) Treatment session with focus on Lt visual attention and compensatory strategies.  Engaged in reading activity with pt reporting difficulty with reading newspaper and phone.  Enlarged font on pt's personal cell phone to increase success and pt able to read headlines in newspaper using head and eye turns to locate beginning of sentence on Lt.  Engaged in matching activity with focus on visual attention and scanning to Lt visual field to find 3 matching cards on busy 7-8 shape cards.  Pt demonstrating increased difficulty when locating cards in Lt visual field but able to compensate with head and eye turns.  Reiterated setup of bathroom with shower seat and  grab bars, with pt stating his mother is still to look for shower seat.  Therapy Documentation Precautions:  Precautions Precautions: Fall, Sternal Restrictions Weight Bearing Restrictions: Yes (sternal precautions) Other Position/Activity Restrictions: Sternal precautions Pain: Pain Assessment Pain Assessment: 0-10 Pain Score: 3  Pain Type: Acute pain Pain Location: Chest Pain Orientation: Mid Pain Descriptors / Indicators: Sore Pain Frequency: Intermittent Pain Onset: With Activity Patients Stated Pain Goal: 4 Pain Intervention(s): Medication (See eMAR)  See Function Navigator for Current Functional Status.   Therapy/Group: Individual Therapy  Rosalio Loud 11/10/2015, 11:10 AM

## 2015-11-10 NOTE — Progress Notes (Signed)
Subjective/Complaints: Pt asking about going home, still nots visual deficit on Left Objective: Vital Signs: Blood pressure 108/65, pulse 89, temperature 98.3 F (36.8 C), temperature source Oral, resp. rate 18, weight 107.321 kg (236 lb 9.6 oz), SpO2 98 %. No results found. Results for orders placed or performed during the hospital encounter of 11/05/15 (from the past 72 hour(s))  CBC WITH DIFFERENTIAL     Status: Abnormal   Collection Time: 11/08/15  6:53 AM  Result Value Ref Range   WBC 12.5 (H) 4.0 - 10.5 K/uL   RBC 4.32 4.22 - 5.81 MIL/uL   Hemoglobin 10.7 (L) 13.0 - 17.0 g/dL   HCT 34.3 (L) 39.0 - 52.0 %   MCV 79.4 78.0 - 100.0 fL   MCH 24.8 (L) 26.0 - 34.0 pg   MCHC 31.2 30.0 - 36.0 g/dL   RDW 13.9 11.5 - 15.5 %   Platelets 642 (H) 150 - 400 K/uL   Neutrophils Relative % 64 %   Neutro Abs 8.0 (H) 1.7 - 7.7 K/uL   Lymphocytes Relative 24 %   Lymphs Abs 2.9 0.7 - 4.0 K/uL   Monocytes Relative 10 %   Monocytes Absolute 1.2 (H) 0.1 - 1.0 K/uL   Eosinophils Relative 2 %   Eosinophils Absolute 0.3 0.0 - 0.7 K/uL   Basophils Relative 0 %   Basophils Absolute 0.1 0.0 - 0.1 K/uL  Comprehensive metabolic panel     Status: Abnormal   Collection Time: 11/08/15  6:53 AM  Result Value Ref Range   Sodium 139 135 - 145 mmol/L   Potassium 4.9 3.5 - 5.1 mmol/L   Chloride 100 (L) 101 - 111 mmol/L   CO2 25 22 - 32 mmol/L   Glucose, Bld 106 (H) 65 - 99 mg/dL   BUN 20 6 - 20 mg/dL   Creatinine, Ser 1.03 0.61 - 1.24 mg/dL   Calcium 8.8 (L) 8.9 - 10.3 mg/dL   Total Protein 6.9 6.5 - 8.1 g/dL   Albumin 2.8 (L) 3.5 - 5.0 g/dL   AST 24 15 - 41 U/L   ALT 28 17 - 63 U/L   Alkaline Phosphatase 70 38 - 126 U/L   Total Bilirubin 0.3 0.3 - 1.2 mg/dL   GFR calc non Af Amer >60 >60 mL/min   GFR calc Af Amer >60 >60 mL/min    Comment: (NOTE) The eGFR has been calculated using the CKD EPI equation. This calculation has not been validated in all clinical situations. eGFR's persistently <60  mL/min signify possible Chronic Kidney Disease.    Anion gap 14 5 - 15      General: No acute distress Mood and affect are appropriate Heart: Regular rate and rhythm no rubs murmurs or extra sounds Lungs: Clear to auscultation, breathing unlabored, no rales or wheezes Abdomen: Positive bowel sounds, soft nontender to palpation, nondistended Extremities: No clubbing, cyanosis, or edema Skin: No evidence of breakdown, no evidence of rash, healing sternotomy wound Neurologic: Cranial nerves II through XII intact, motor strength is 5/5 in bilateral deltoid, bicep, tricep, grip, hip flexor, knee extensors, ankle dorsiflexor and plantar flexor, visual fields Left lower quadrantanopsia Sensory exam normal sensation to light touch and proprioception in bilateral upper and lower extremities Cerebellar exam normal finger to nose to finger as well as heel to shin in bilateral upper and lower extremities Musculoskeletal: Full range of motion in all 4 extremities. No joint swelling   Assessment/Plan: 1. Functional deficits secondary to Left homonymous hemianopsia, gait disorder which  require 3+ hours per day of interdisciplinary therapy in a comprehensive inpatient rehab setting. Physiatrist is providing close team supervision and 24 hour management of active medical problems listed below. Physiatrist and rehab team continue to assess barriers to discharge/monitor patient progress toward functional and medical goals. FIM: Function - Bathing Position: Shower Body parts bathed by patient: Right arm, Right upper leg, Left upper leg, Left arm, Chest, Abdomen, Front perineal area, Left lower leg, Buttocks, Right lower leg, Back Body parts bathed by helper: Back Assist Level: Set up Set up : To obtain items  Function- Upper Body Dressing/Undressing What is the patient wearing?: Pull over shirt/dress Pull over shirt/dress - Perfomed by patient: Thread/unthread right sleeve, Thread/unthread left sleeve,  Put head through opening, Pull shirt over trunk Assist Level: Set up Set up : To obtain clothing/put away Function - Lower Body Dressing/Undressing What is the patient wearing?: Pants, Non-skid slipper socks, Underwear Position:  (recliner) Underwear - Performed by patient: Thread/unthread right underwear leg, Thread/unthread left underwear leg, Pull underwear up/down Pants- Performed by patient: Thread/unthread right pants leg, Thread/unthread left pants leg, Pull pants up/down, Fasten/unfasten pants Non-skid slipper socks- Performed by patient: Don/doff right sock, Don/doff left sock Non-skid slipper socks- Performed by helper: Don/doff right sock Assist for footwear: Supervision/touching assist Assist for lower body dressing: Supervision or verbal cues, Set up, Assistive device Assistive Device Comment: sock aid Set up : To obtain clothing/put away  Function - Toileting Toileting steps completed by patient: Adjust clothing prior to toileting Toileting steps completed by helper: Performs perineal hygiene, Adjust clothing after toileting Toileting Assistive Devices: Grab bar or rail Assist level: Touching or steadying assistance (Pt.75%)  Function - Toilet Transfers Toilet transfer assistive device: Elevated toilet seat/BSC over toilet Assist level to toilet: Supervision or verbal cues Assist level from toilet: Supervision or verbal cues  Function - Chair/bed transfer Chair/bed transfer method: Stand pivot, Ambulatory Chair/bed transfer assist level: Supervision or verbal cues Chair/bed transfer assistive device: Walker Chair/bed transfer details: Verbal cues for safe use of DME/AE  Function - Locomotion: Wheelchair Will patient use wheelchair at discharge?: No Type: Manual Max wheelchair distance: 150 Assist Level: Supervision or verbal cues Assist Level: Supervision or verbal cues Assist Level: Supervision or verbal cues Function - Locomotion: Ambulation Assistive device:  Walker-rolling Max distance: 200 Assist level: Supervision or verbal cues Assist level: Supervision or verbal cues Assist level: Supervision or verbal cues Walk 150 feet activity did not occur: Safety/medical concerns Assist level: Supervision or verbal cues Walk 10 feet on uneven surfaces activity did not occur: Safety/medical concerns  Function - Comprehension Comprehension: Auditory Comprehension assist level: Understands basic 90% of the time/cues < 10% of the time  Function - Expression Expression: Verbal Expression assist level: Expresses basic 90% of the time/requires cueing < 10% of the time.  Function - Social Interaction Social Interaction assist level: Interacts appropriately 90% of the time - Needs monitoring or encouragement for participation or interaction.  Function - Problem Solving Problem solving assist level: Solves basic 75 - 89% of the time/requires cueing 10 - 24% of the time  Function - Memory Memory assist level: Recognizes or recalls 90% of the time/requires cueing < 10% of the time Patient normally able to recall (first 3 days only): Current season, That he or she is in a hospital  Medical Problem List and Plan: 1. Left hemianopsia, gait deficits, and weakness secondary to right PCA infarct/ status post aortic dissection repair-Team conference today please see physician documentation under team  conference tab, met with team face-to-face to discuss problems,progress, and goals. Formulized individual treatment plan based on medical history, underlying problem and comorbidities. 2. DVT Prophylaxis/Anticoagulation: Subcutaneous Lovenox. Monitor platelet counts of any signs of bleeding 3. Pain Management: Oxycodone and as needed -pt with chronic right hip OA and related pain, reviewed Xray 02/24/2015 severe jt space loss  4.Hypertension/Atrial fib.Amiodarone 200 mg BID,Clonidine 0.2 mg BID,Lasix 40 mg daily.Monitor with increased mobility. 108/59,  monitor for dizziness, check orthostatic vitals 5. Tobacco /Polysubstance abuse. Counseling 6.  Nutrition- 25-100% meals   LOS (Days) 5 A FACE TO FACE EVALUATION WAS PERFORMED  [name] 11/10/2015, 8:10 AM

## 2015-11-10 NOTE — Progress Notes (Signed)
Social Work Patient ID: [name], male   DOB: Feb 01, 1961, 55 y.o.   MRN: [id number] Met with pt to discuss team conference goals-mod/i-supervision level and target discharge 3/4. He is pleased with how well he is doing but is concerned about his vision and the field cut he has. He will talk with Mom when she is here later,this worker offered to call her and he felt it was not necessary. He is doing the exercises for his right hip pain he does not know what he did to it. Discussed follow up therapies and he is agreeable to this. He has equipment for home form other family members. Will work toward discharge Sat.

## 2015-11-10 NOTE — Progress Notes (Signed)
  Subjective: Getting stronger L upper visual field with some improvement Working on stair safety  Objective: Vital signs in last 24 hours: Temp:  [97.5 F (36.4 C)-98.3 F (36.8 C)] 98.3 F (36.8 C) (03/01 0541) Pulse Rate:  [89-94] 89 (03/01 0541) Cardiac Rhythm:  [-]  Resp:  [18] 18 (03/01 0541) BP: (108-130)/(65-73) 108/65 mmHg (03/01 0541) SpO2:  [93 %-99 %] 98 % (03/01 0541) Weight:  [236 lb 9.6 oz (107.321 kg)] 236 lb 9.6 oz (107.321 kg) (03/01 0541)  Hemodynamic parameters for last 24 hours:  nsr  Intake/Output from previous day: 02/28 0701 - 03/01 0700 In: 480 [P.O.:480] Out: 1475 [Urine:1475] Intake/Output this shift: Total I/O In: 120 [P.O.:120] Out: -        Exam    General- alert and comfortable   Lungs- clear without rales, wheezes   Cor- regular rate and rhythm, no murmur , gallop   Abdomen- soft, non-tender   Extremities - warm, non-tender, minimal edema   Neuro- oriented, appropriate, no focal weakness   Lab Results:  Recent Labs  11/08/15 0653  WBC 12.5*  HGB 10.7*  HCT 34.3*  PLT 642*   BMET:  Recent Labs  11/08/15 0653  NA 139  K 4.9  CL 100*  CO2 25  GLUCOSE 106*  BUN 20  CREATININE 1.03  CALCIUM 8.8*    PT/INR: No results for input(s): LABPROT, INR in the last 72 hours. ABG    Component Value Date/Time   PHART 7.457* 10/30/2015 1725   HCO3 21.7 10/30/2015 1725   TCO2 31 11/02/2015 1515   ACIDBASEDEF 2.0 10/30/2015 1725   O2SAT 52.0 10/31/2015 0350   CBG (last 3)  No results for input(s): GLUCAP in the last 72 hours.  Assessment/Plan: S/P   Cont current excellent  rehab care   LOS: 5 days    Kathlee Nations Trigt III 11/10/2015

## 2015-11-10 NOTE — Progress Notes (Signed)
Physical Therapy Session Note  Patient Details  Name: [name] MRN: [id number] Date of Birth: 02/09/1961  Today's Date: 11/10/2015 PT Individual Time: 1000-1055 and 1130-1200 PT Individual Time Calculation (min): 55 min and 30 min (total 85 min) Short Term Goals: Week 1:  PT Short Term Goal 1 (Week 1): Pt will increase bed mobility to S.  PT Short Term Goal 2 (Week 1): Pt will increase transfers to S without assistive device. PT Short Term Goal 3 (Week 1): Pt willl increase ambulation to 10 feet with LRAD and S.  PT Short Term Goal 4 (Week 1): Pt will ascend/descend 8 stairs with 2 rails and min guard.  PT Short Term Goal 5 (Week 1): Pt will increase standing balance to S for functional mobility.   Skilled Therapeutic Interventions/Progress Updates:    Tx 1: Pt received seated in recliner, c/o R hip pain as described below and agreeable to treatment. Reports he had less morning pain in R hip than usual after PT session yesterday with hip mobilizations and PROM. Gait to/from gym x200' with RW and S. Lateral and inferior hip mobilizations grade III/IV supine with belt. Supine hip flexor stretch RLE off edge of mat table x2 min. Sidelying hip abduction clamshell 2 x 15 reps; attempted sidelying straight leg raise however pt unable due to pain and strength deficits. Bridging 2 x 10 reps. Pt given handout with exercises and instruction on performance at home. Returned to room as above, remained seated in recliner at completion of session, all needs within reach.   Tx 2: Pt received seated in recliner, asleep and easily awoken. Agreeable to treatment. Ambulation in room with S without using AD to bathroom. Gait to/from gym x200' with RW and S. Nustep x10 min total with BLE for strengthening and aerobic endurance. Requires several rest breaks after approximately 45 sec-1 min of AROM. Gait to return to room with RW and S. Pt remained seated in recliner at completion of session, all needs within reach.    Therapy Documentation Precautions:  Precautions Precautions: Fall, Sternal Restrictions Weight Bearing Restrictions: Yes (sternal precautions) Other Position/Activity Restrictions: Sternal precautions Pain: Pain Assessment Pain Assessment: 0-10 Pain Score: 3  Pain Type: Acute pain Pain Location: Chest Pain Orientation: Mid Pain Descriptors / Indicators: Sore Pain Frequency: Intermittent Pain Onset: With Activity Patients Stated Pain Goal: 4 Pain Intervention(s): Medication (See eMAR)   See Function Navigator for Current Functional Status.   Therapy/Group: Individual Therapy  Vista Lawman 11/10/2015, 12:01 PM

## 2015-11-10 NOTE — Patient Care Conference (Signed)
Inpatient RehabilitationTeam Conference and Plan of Care Update Date: 11/10/2015   Time: 11:10 AM    Patient Name: [name]      Medical Record Number: [id number]  Date of Birth: [birth year] Sex: Male         Room/Bed: 4W22C/4W22C-01 Payor Info: Payor: MEDICAID POTENTIAL / Plan: MEDICAID POTENTIAL / Product Type: *No Product type* /    Admitting Diagnosis: PR PCA infarct after repairof aortic dissection  Admit Date/Time:  11/05/2015  5:10 PM Admission Comments: No comment available   Primary Diagnosis:  Acute ischemic right PCA stroke (HCC) Principal Problem: Acute ischemic right PCA stroke [facility])  Patient Active Problem List   Diagnosis Date Noted  . Acute ischemic right PCA stroke (HCC) 11/05/2015  . Left homonymous hemianopsia   . Aortic dissection, thoracic (HCC) 10/28/2015  . Cerebral thrombosis with cerebral infarction 10/26/2015  . Aortic dissection (HCC) 10/25/2015  . Osteoarthritis of right hip 03/01/2015  . Aortic dissection, thoracoabdominal (HCC) 03/01/2015  . Vitamin D deficiency 01/28/2015  . Nonspecific abnormal electrocardiogram (ECG) (EKG) 01/26/2015  . Pre-diabetes 01/13/2015  . Benign paroxysmal positional vertigo 01/12/2015  . Alcohol abuse 01/07/2015  . Cocaine abuse 01/07/2015  . Tobacco abuse 01/07/2015  . Essential hypertension     Expected Discharge Date: Expected Discharge Date: 11/13/15  Team Members Present: Physician leading conference: Dr. Claudette Laws Social Worker Present: Dossie Der, LCSW Nurse Present: Carmie End, RN PT Present: Alyson Reedy, PT OT Present: Rosalio Loud, OT SLP Present: Jackalyn Lombard, SLP PPS Coordinator present : Edson Snowball, PT     Current Status/Progress Goal Weekly Team Focus  Medical   Left visual field cut, sutures removed from Chest drains  Home with Sup/Mod I  Adjust BP meds   Bowel/Bladder   Continent of bowel and bladder. LBM 11/08/15  Pt to remain continent of bowel and bladder  Monitor    Swallow/Nutrition/ Hydration     na        ADL's   supervision bathing and dressing, supervision transfers and mobility with and without AD  Mod I overall, supervision shower transfer and meal prep  activity tolerance, Lt visual field   Mobility   S gait, transfers, stairs  mod I overall, S community mobility  endurance, LE strength, dynamic standing balance   Communication     na        Safety/Cognition/ Behavioral Observations    no unsafe behaviors        Pain   Oxy IR  q 3hrs for chest discomfort  <3  Monitor for effectivenss of pain medication   Skin   Thoracic incision healing OTA, healing appropriately  No additional skin breakdown  Monitor      *See Care Plan and progress notes for long and short-term goals.  Barriers to Discharge: see above    Possible Resolutions to Barriers:  cont rehab    Discharge Planning/Teaching Needs:  HOme to Mom's home where she can provide intermittent assist-pt needs to be mod/i level      Team Discussion:  Goals-mod/i-supervision level- Left field cut causing his visual deficits. CVTS-DC sutures today and monitoring his drainage from incision. Monitoring his BP low today-MD aware. Mom can provide intermittent supervision. Applying for Medicaid and disability Thursday  Revisions to Treatment Plan:  None   Continued Need for Acute Rehabilitation Level of Care: The patient requires daily medical management by a physician with specialized training in physical medicine and rehabilitation for the following conditions: Daily direction of  a multidisciplinary physical rehabilitation program to ensure safe treatment while eliciting the highest outcome that is of practical value to the patient.: Yes Daily medical management of patient stability for increased activity during participation in an intensive rehabilitation regime.: Yes Daily analysis of laboratory values and/or radiology reports with any subsequent need for medication adjustment of  medical intervention for : Blood pressure problems;Neurological problems  Lucy Chris 11/10/2015, 12:51 PM

## 2015-11-11 ENCOUNTER — Inpatient Hospital Stay (HOSPITAL_COMMUNITY): Payer: Medicaid Other | Admitting: Physical Therapy

## 2015-11-11 ENCOUNTER — Inpatient Hospital Stay (HOSPITAL_COMMUNITY): Payer: Medicaid Other | Admitting: Occupational Therapy

## 2015-11-11 NOTE — Progress Notes (Signed)
Physical Therapy Session Note  Patient Details  Name: [name] MRN: [id number] Date of Birth: July 03, 1961  Today's Date: 11/11/2015 PT Individual Time: 1100-1200 and 1430-1528 PT Individual Time Calculation (min): 60 min and 28 min (total 88 min)   Short Term Goals: Week 1:  PT Short Term Goal 1 (Week 1): Pt will increase bed mobility to S.  PT Short Term Goal 2 (Week 1): Pt will increase transfers to S without assistive device. PT Short Term Goal 3 (Week 1): Pt willl increase ambulation to 10 feet with LRAD and S.  PT Short Term Goal 4 (Week 1): Pt will ascend/descend 8 stairs with 2 rails and min guard.  PT Short Term Goal 5 (Week 1): Pt will increase standing balance to S for functional mobility.   Skilled Therapeutic Interventions/Progress Updates:    Tx 1: Pt received seated in recliner, denies pain and agreeable to treatment. Gait to laundry room with RW and S x150'. Pt reports he has never done laundry before; required min verbal cues for sequencing to set machine. Gait to gym x150' RW and S. Pt instructed to perform exercises he learned yesterday including clamshell, bridging, and hip flexor stretch off EOB. Performs correctly with no cues needed. Pt given fall prevention exercise handout and instructed in standing LE exercises including hip abduction, hamstring curl, mini-squat, heel/toe raises in standing with light UE support on RW. Returned to room with RW and S; remained seated in w/c at completion of session all needs within reach.   Tx 2: Pt received seated in recliner, agreeable to treatment and denies pain. Gait to/from therapy gym with RW and S. Gait throughout session short distances <50' at a time in gym without AD and close S. Nustep x10 min total with BLE for strengthening and endurance; several short rest breaks due to fatigue. Obstacle course x 5 trials including cones, weighted bars to step over, 1" step, balance foam. Close S throughout, no major LOB noted. Standing  balance in parallel bars while performing alternating toe taps to numbered targets on 6" step, 20 alternating LE step-ups, and rocker board medial/lateral. Min guard overall and occasional use of UEs on bars for stability. Returned to room with ambulation as above, remained seated in recliner at completion of session all needs within reach.   Therapy Documentation Precautions:  Precautions Precautions: Fall, Sternal Restrictions Weight Bearing Restrictions: Yes (sternal precautions) Other Position/Activity Restrictions: Sternal precautions Pain: Pain Assessment Pain Assessment: No/denies pain Pain Score: 0-No pain Pain Type: Acute pain Pain Location: Chest Pain Orientation: Mid Pain Descriptors / Indicators: Aching Pain Frequency: Intermittent Pain Onset: With Activity Patients Stated Pain Goal: 3 Pain Intervention(s): Medication (See eMAR)   See Function Navigator for Current Functional Status.   Therapy/Group: Individual Therapy  Vista Lawman 11/11/2015, 3:34 PM

## 2015-11-11 NOTE — Progress Notes (Signed)
Occupational Therapy Session Note  Patient Details  Name: [name] MRN: [id number] Date of Birth: Apr 07, 1961  Today's Date: 11/11/2015 OT Individual Time: 2130-8657 OT Individual Time Calculation (min): 75 min    Short Term Goals: Week 1:  OT Short Term Goal 1 (Week 1): STG=LTG due to LOS  Skilled Therapeutic Interventions/Progress Updates:    ADL retraining completed at overall Mod I level with pt able to obtain clothing prior to bathing and completing walk-in shower transfers with distant supervision.  Pt completed bathing and dressing at sit > stand level in room shower, required prolonged rest break prior to donning socks.  Pt able to don Rt sock this session without use of AE.  Utilized Dynavision to address increased attention to Lt due to visual field cut. Pt able to locate 33/37 and 36/39 in 3 second time limit with increased reaction time and missed lights in Lt lower quadrant.  Attempted dual task with scanning between t-scope to identify numbers on screen and still press lights with pt able to correctly name all numbers but only pressing 12/23 lights.  Therapy Documentation Precautions:  Precautions Precautions: Fall, Sternal Restrictions Weight Bearing Restrictions: Yes (sternal precautions) Other Position/Activity Restrictions: Sternal precautions General:   Vital Signs: Therapy Vitals Pulse Rate: (!) 102 Resp: 16 Patient Position (if appropriate): Sitting Oxygen Therapy SpO2: 96 % O2 Device: Not Delivered Pain: Pain Assessment Pain Assessment: No/denies pain Pain Score: 0-No pain  See Function Navigator for Current Functional Status.   Therapy/Group: Individual Therapy  Rosalio Loud 11/11/2015, 12:23 PM

## 2015-11-11 NOTE — Progress Notes (Signed)
Subjective/Complaints: Discussed visual disturbance Objective: Vital Signs: Blood pressure 109/70, pulse 88, temperature 97.9 F (36.6 C), temperature source Oral, resp. rate 18, weight 105.733 kg (233 lb 1.6 oz), SpO2 100 %. No results found. No results found for this or any previous visit (from the past 72 hour(s)).    General: No acute distress Mood and affect are appropriate Heart: Regular rate and rhythm no rubs murmurs or extra sounds Lungs: Clear to auscultation, breathing unlabored, no rales or wheezes Abdomen: Positive bowel sounds, soft nontender to palpation, nondistended Extremities: No clubbing, cyanosis, or edema Skin: No evidence of breakdown, no evidence of rash, healing sternotomy wound Neurologic: Cranial nerves II through XII intact, motor strength is 5/5 in bilateral deltoid, bicep, tricep, grip, hip flexor, knee extensors, ankle dorsiflexor and plantar flexor, visual fields Left lower quadrantanopsia Sensory exam normal sensation to light touch and proprioception in bilateral upper and lower extremities Cerebellar exam normal finger to nose to finger as well as heel to shin in bilateral upper and lower extremities Musculoskeletal: Full range of motion in all 4 extremities. No joint swelling   Assessment/Plan: 1. Functional deficits secondary to Left homonymous hemianopsia, gait disorder which require 3+ hours per day of interdisciplinary therapy in a comprehensive inpatient rehab setting. Physiatrist is providing close team supervision and 24 hour management of active medical problems listed below. Physiatrist and rehab team continue to assess barriers to discharge/monitor patient progress toward functional and medical goals. FIM: Function - Bathing Position: Shower Body parts bathed by patient: Right arm, Right upper leg, Left upper leg, Left arm, Chest, Abdomen, Front perineal area, Left lower leg, Buttocks, Right lower leg Body parts bathed by helper:  Back Assist Level: More than reasonable time Set up : To obtain items  Function- Upper Body Dressing/Undressing What is the patient wearing?: Pull over shirt/dress Pull over shirt/dress - Perfomed by patient: Thread/unthread right sleeve, Thread/unthread left sleeve, Put head through opening, Pull shirt over trunk Assist Level: More than reasonable time Set up : To obtain clothing/put away Function - Lower Body Dressing/Undressing What is the patient wearing?: Pants, Non-skid slipper socks, Underwear Position:  (recliner) Underwear - Performed by patient: Thread/unthread right underwear leg, Thread/unthread left underwear leg, Pull underwear up/down Pants- Performed by patient: Thread/unthread right pants leg, Thread/unthread left pants leg, Pull pants up/down, Fasten/unfasten pants Non-skid slipper socks- Performed by patient: Don/doff right sock, Don/doff left sock Non-skid slipper socks- Performed by helper: Don/doff right sock Assist for footwear: Setup Assist for lower body dressing: Assistive device Assistive Device Comment: sock aid Set up : To obtain clothing/put away  Function - Toileting Toileting steps completed by patient: Adjust clothing prior to toileting, Adjust clothing after toileting Toileting steps completed by helper: Performs perineal hygiene, Adjust clothing after toileting Toileting Assistive Devices: Grab bar or rail Assist level: Supervision or verbal cues  Function - Toilet Transfers Toilet transfer assistive device: Grab bar Assist level to toilet: Supervision or verbal cues Assist level from toilet: Supervision or verbal cues  Function - Chair/bed transfer Chair/bed transfer method: Stand pivot, Ambulatory Chair/bed transfer assist level: Supervision or verbal cues Chair/bed transfer assistive device: Walker Chair/bed transfer details: Verbal cues for safe use of DME/AE  Function - Locomotion: Wheelchair Will patient use wheelchair at discharge?:  No Type: Manual Max wheelchair distance: 150 Assist Level: Supervision or verbal cues Assist Level: Supervision or verbal cues Assist Level: Supervision or verbal cues Function - Locomotion: Ambulation Assistive device: Walker-rolling Max distance: 200 Assist level: Supervision or verbal cues  Assist level: Supervision or verbal cues Assist level: Supervision or verbal cues Walk 150 feet activity did not occur: Safety/medical concerns Assist level: Supervision or verbal cues Walk 10 feet on uneven surfaces activity did not occur: Safety/medical concerns  Function - Comprehension Comprehension: Auditory Comprehension assist level: Follows complex conversation/direction with no assist  Function - Expression Expression: Verbal Expression assist level: Expresses complex ideas: With no assist  Function - Social Interaction Social Interaction assist level: Interacts appropriately with others - No medications needed.  Function - Problem Solving Problem solving assist level: Solves complex problems: Recognizes & self-corrects  Function - Memory Memory assist level: Complete Independence: No helper Patient normally able to recall (first 3 days only): Current season  Medical Problem List and Plan: 1. Left hemianopsia, gait deficits, and weakness secondary to right PCA infarct/ status post aortic dissection repair-discussed D/C date                        2. DVT Prophylaxis/Anticoagulation: Subcutaneous Lovenox. Monitor platelet counts of any signs of bleeding, based on mobility, may d/c 3. Pain Management: Oxycodone and as needed -pt with chronic right hip OA and related pain, reviewed Xray 02/24/2015 severe jt space loss  4.Hypertension/Atrial fib.Amiodarone 200 mg BID,Clonidine 0.2 mg BID,Lasix 40 mg daily.Monitor with increased mobility, no clear orthostatic drop.  5. Tobacco /Polysubstance abuse. Counseling 6.  Nutrition- 25-100% meals   LOS (Days) 6 A FACE TO FACE  EVALUATION WAS PERFORMED  Inanna Telford E 11/11/2015, 7:36 AM

## 2015-11-12 ENCOUNTER — Inpatient Hospital Stay (HOSPITAL_COMMUNITY): Payer: Medicaid Other | Admitting: Physical Therapy

## 2015-11-12 ENCOUNTER — Inpatient Hospital Stay (HOSPITAL_COMMUNITY): Payer: Medicaid Other | Admitting: Occupational Therapy

## 2015-11-12 LAB — CREATININE, SERUM
CREATININE: 0.96 mg/dL (ref 0.61–1.24)
GFR calc Af Amer: >60 mL/min (ref >60)
GFR calc non Af Amer: >60 mL/min (ref >60)

## 2015-11-12 MED ORDER — BUDESONIDE-FORMOTEROL FUMARATE 160-4.5 MCG/ACT IN AERO: 2.0000 | INHALATION_SPRAY | Freq: Two times a day (BID) | RESPIRATORY_TRACT | Status: DC

## 2015-11-12 MED ORDER — ALPRAZOLAM 0.25 MG PO TABS: 0.2500 mg | ORAL_TABLET | Freq: Two times a day (BID) | ORAL | Status: DC | PRN

## 2015-11-12 MED ORDER — PANTOPRAZOLE SODIUM 40 MG PO TBEC: 40.0000 mg | DELAYED_RELEASE_TABLET | Freq: Every day | ORAL | Status: DC

## 2015-11-12 MED ORDER — CLONIDINE HCL 0.2 MG PO TABS: 0.2000 mg | ORAL_TABLET | Freq: Two times a day (BID) | ORAL | Status: DC

## 2015-11-12 MED ORDER — AMIODARONE HCL 200 MG PO TABS: 200.0000 mg | ORAL_TABLET | Freq: Two times a day (BID) | ORAL | Status: DC

## 2015-11-12 MED ORDER — OXYCODONE HCL 5 MG PO TABS: 5.0000 mg | ORAL_TABLET | ORAL | Status: DC | PRN

## 2015-11-12 MED ORDER — FUROSEMIDE 20 MG PO TABS: 20.0000 mg | ORAL_TABLET | Freq: Every day | ORAL | Status: DC

## 2015-11-12 MED ORDER — POTASSIUM CHLORIDE CRYS ER 20 MEQ PO TBCR: 20.0000 meq | EXTENDED_RELEASE_TABLET | Freq: Every day | ORAL | Status: DC

## 2015-11-12 MED FILL — Naphazoline-Glycerin Ophth Soln 0.03-0.5%: OPHTHALMIC | Qty: 15 | Status: AC

## 2015-11-12 MED FILL — SYMBICORT 160-4.5 MCG INH: 160-4.5 | 30 days supply | Qty: 10 | Fill #0 | Status: TO

## 2015-11-12 MED FILL — ?FUROSEMIDE 20 MG TABLET: 20 | 30 days supply | Qty: 30 | Fill #0 | Status: TO

## 2015-11-12 MED FILL — POTASSIUM CL ER 20 MEQ TAB: 20 | 30 days supply | Qty: 30 | Fill #0 | Status: TO

## 2015-11-12 MED FILL — ?PANTOPRAZOLE SOD DR 40MG: 40 MG | 30 days supply | Qty: 30 | Fill #0 | Status: TO

## 2015-11-12 MED FILL — ALPRAZolam 0.25 MG TABS: 0.25 | 15 days supply | Qty: 30 | Fill #0

## 2015-11-12 MED FILL — AMIODARONE HCL 200 MG TAB: 200 | 30 days supply | Qty: 60 | Fill #0 | Status: TO

## 2015-11-12 MED FILL — oxyCODONE HCL 5 MG TABS: 5 | 8 days supply | Qty: 60 | Fill #0

## 2015-11-12 MED FILL — ?CLONIDINE HCL 0.2 MG TABLE: 0.2 MG | 30 days supply | Qty: 60 | Fill #0 | Status: TO

## 2015-11-12 NOTE — Progress Notes (Signed)
Physical Therapy Session Note  Patient Details  Name: [name] MRN: [id number] Date of Birth: April 01, 1961  Today's Date: 11/12/2015 PT Individual Time: 1027-1057 PT Individual Time Calculation (min): 30 min   Short Term Goals: Week 1:  PT Short Term Goal 1 (Week 1): Pt will increase bed mobility to S.  PT Short Term Goal 2 (Week 1): Pt will increase transfers to S without assistive device. PT Short Term Goal 3 (Week 1): Pt willl increase ambulation to 10 feet with LRAD and S.  PT Short Term Goal 4 (Week 1): Pt will ascend/descend 8 stairs with 2 rails and min guard.  PT Short Term Goal 5 (Week 1): Pt will increase standing balance to S for functional mobility.   Skilled Therapeutic Interventions/Progress Updates:  Session focused on dynamic balance, ambulation without AD, and activity tolerance. Patient missed first 30 min of session due to questions/concerns regarding prescriptions in preparation for discharge home tomorrow, PA-C Dan present to answer questions. Patient instructed in FGA with score of 27/30, above cutoff for increased falls risk, see below. Patient ambulated without AD with mod I back to room x 150 ft and made mod I in room. Patient required seated rest breaks between tasks due to decreased activity tolerance and chest and R hip pain, RN notified of request for pain medication at end of session. Patient with questions regarding dietary counseling, discussed with patient healthy eating habits and recommended he consult with nursing for further details.     Functional Gait Assessment (FGA) Requirements: A marked 6-m (20-ft) walkway that is marked with a 30.48-cm (12-in) width.  _3_ 1. GAIT LEVEL SURFACE Instructions: Walk at your normal speed from here to the next mark (6 m[20 ft]). Grading: Loraine Leriche the highest category that applies. (3) Normal-Walks 6 m (20 ft) in less than 5.5 seconds, no assistive devices, good speed, no evidence for imbalance, normal gait pattern,  deviates no more than 15.24 cm (6 in) outside of the 30.48-cm (12-in) walkway width. (2) Mild impairment-Walks 6 m (20 ft) in less than 7 seconds but greater than 5.5 seconds, uses assistive device, slower speed, mild gait deviations, or deviates 15.24-25.4 cm (6-10 in) outside of the 30.48-cm (12-in) walkway width. (1) Moderate impairment-Walks 6 m (20 ft), slow speed, abnormal gait pattern, evidence for imbalance, or deviates 25.4-38.1 cm (10-15 in) outside of the 30.48-cm (12-in) walkway width. Requires more than 7 seconds to ambulate 6 m (20 ft). (0) Severe impairment-Cannot walk 6 m (20 ft) without assistance,severe gait deviations or imbalance, deviates greater than 38.1 cm (15 in) outside of the 30.48-cm (12-in) walkway width or reaches and touches the wall.  _3_ 2. CHANGE IN GAIT SPEED Instructions: Begin walking at your normal pace (for 1.5 m [5 ft]). When I tell you "go," walk as fast as you can (for 1.5 m [5 ft]). When I tell you "slow," walk as slowly as you can (for 1.5 m [5 ft]). Grading: Loraine Leriche the highest category that applies. (3) Normal-Able to smoothly change walking speed without loss of balance or gait deviation. Shows a significant difference in walking speeds between normal, fast, and slow speeds. Deviates no more than 15.24 cm (6 in) outside of the 30.48-cm (12-in) walkway width. (2) Mild impairment-Is able to change speed but demonstrates mild gait deviations, deviates 15.24-25.4 cm (6-10 in) outside of the 30.48-cm (12-in) walkway width, or no gait deviations but unable to achieve a significant change in velocity, or uses an assistive device. (1) Moderate impairment-Makes only  minor adjustments to walking speed, or accomplishes a change in speed with significant gait deviations, deviates 25.4-38.1 cm (10-15 in) outside the 30.48-cm (12-in) walkway width, or changes speed but loses balance but is able to recover and continue walking. (0) Severe impairment-Cannot change speeds,  deviates greater than 38.1 cm (15 in) outside 30.48-cm (12-in) walkway width, or loses balance and has to reach for wall or be caught.  _3_ 3. GAIT WITH HORIZONTAL HEAD TURNS Instructions: Walk from here to the next mark 6 m (20 ft) away. Begin walking at your normal pace. Keep walking straight; after 3 steps, turn your head to the right and keep walking straight while looking to the right. After 3 more steps, turn your head to the left and keep walking straight while looking left. Continue alternating looking right and left every 3 steps until you have completed 2 repetitions in each direction. Grading: Loraine Leriche the highest category that applies. (3) Normal-Performs head turns smoothly with no change in gait. Deviates no more than 15.24 cm (6 in) outside 30.48-cm (12-in) walkway width. (2) Mild impairment-Performs head turns smoothly with slight change in gait velocity (eg, minor disruption to smooth gait path), deviates 15.24-25.4 cm (6-10 in) outside 30.48-cm (12-in) walkway width, or uses an assistive device.  (1) Moderate impairment-Performs head turns with moderate change in gait velocity, slows down, deviates 25.4-38.1 cm (10-15 in) outside 30.48-cm (12-in) walkway width but recovers, can continue to walk. (0) Severe impairment-Performs task with severe disruption of gait (eg, staggers 38.1 cm [15 in] outside 30.48-cm (12-in) walkway width, loses balance, stops, or reaches for wall).  _3_ 4. GAIT WITH VERTICAL HEAD TURNS Instructions: Walk from here to the next mark (6 m [20 ft]). Begin walking at your normal pace. Keep walking straight; after 3 steps, tip your head up and keep walking straight while looking up. After 3 more steps, tip your head down, keep walking straight while looking down. Continue alternating looking up and down every 3 steps until you have completed 2 repetitions in each direction. Grading: Loraine Leriche the highest category that applies. (3) Normal-Performs head turns with no change in  gait. Deviates no more than 15.24 cm (6 in) outside 30.48-cm (12-in) walkway width. (2) Mild impairment-Performs task with slight change in gait velocity (eg, minor disruption to smooth gait path), deviates 15.24-25.4 cm (6-10 in) outside 30.48-cm (12-in) walkway width or uses assistive device. (1) Moderate impairment-Performs task with moderate change in gait velocity, slows down, deviates 25.4-38.1 cm (10-15 in) outside 30.48-cm (12-in) walkway width but recovers, can continue to walk. (0) Severe impairment-Performs task with severe disruption of gait (eg, staggers 38.1 cm [15 in] outside 30.48-cm (12-in) walkway width, loses balance, stops, reaches for wall).  _3_ 5. GAIT AND PIVOT TURN Instructions: Begin with walking at your normal pace. When I tell you, "turn and stop," turn as quickly as you can to face the opposite direction and stop. Grading: Loraine Leriche the highest category that applies. (3) Normal-Pivot turns safely within 3 seconds and stops quickly with no loss of balance. (2) Mild impairment-Pivot turns safely in _3 seconds and stops with no loss of balance, or pivot turns safely within 3 seconds and stops with mild imbalance, requires small steps to catch balance. (1) Moderate impairment-Turns slowly, requires verbal cueing, or requires several small steps to catch balance following turn and stop. (0) Severe impairment-Cannot turn safely, requires assistance to turn and stop.  _2_ 6. STEP OVER OBSTACLE Instructions: Begin walking at your normal speed. When you come  to the shoe box, step over it, not around it, and keep walking. Grading: Loraine Leriche the highest category that applies. (3) Normal-Is able to step over 2 stacked shoe boxes taped together (22.86 cm [9 in] total height) without changing gait speed; no evidence of imbalance. (2) Mild impairment-Is able to step over one shoe box (11.43 cm [4.5 in] total height) without changing gait speed; no evidence of imbalance. (1) Moderate  impairment-Is able to step over one shoe box (11.43 cm [4.5 in] total height) but must slow down and adjust steps to clear box safely. May require verbal cueing. (0) Severe impairment-Cannot perform without assistance.  _1_ 7. GAIT WITH NARROW BASE OF SUPPORT Instructions: Walk on the floor with arms folded across the chest, feet aligned heel to toe in tandem for a distance of 3.6 m [12 ft]. The number of steps taken in a straight line are counted for a maximum of 10 steps. Grading: Loraine Leriche the highest category that applies. (3) Normal-Is able to ambulate for 10 steps heel to toe with no staggering. (2) Mild impairment-Ambulates 7-9 steps. (1) Moderate impairment-Ambulates 4-7 steps. (0) Severe impairment-Ambulates less than 4 steps heel to toe or cannot perform without assistance.  _3_ 8. GAIT WITH EYES CLOSED Instructions: Walk at your normal speed from here to the next mark (6 m [20 ft]) with your eyes closed. Grading: Loraine Leriche the highest category that applies. (3) Normal-Walks 6 m (20 ft), no assistive devices, good speed, no evidence of imbalance, normal gait pattern, deviates no more than 15.24 cm (6 in) outside 30.48-cm (12-in) walkway width. Ambulates 6 m (20 ft) in less than 7 seconds. (2) Mild impairment-Walks 6 m (20 ft), uses assistive device, slower speed, mild gait deviations, deviates 15.24-25.4 cm (6-10 in) outside 30.48-cm (12-in) walkway width. Ambulates 6 m (20 ft) in less than 9 seconds but greater than 7 seconds. (1) Moderate impairment-Walks 6 m (20 ft), slow speed, abnormal gait pattern, evidence for imbalance, deviates 25.4-38.1 cm (10-15 in) outside 30.48-cm (12-in) walkway width. Requires more than 9 seconds to ambulate 6 m (20 ft). (0) Severe impairment-Cannot walk 6 m (20 ft) without assistance, severe gait deviations or imbalance, deviates greater than 38.1 cm (15 in) outside 30.48-cm (12-in) walkway width or will not attempt task.  _3_ 9. AMBULATING BACKWARDS Instructions:  Walk backwards until I tell you to stop. Grading: Loraine Leriche the highest category that applies. (3) Normal-Walks 6 m (20 ft), no assistive devices, good speed, no evidence for imbalance, normal gait pattern, deviates no more than 15.24 cm (6 in) outside 30.48-cm (12-in) walkway width. (2) Mild impairment-Walks 6 m (20 ft), uses assistive device, slower speed, mild gait deviations, deviates 15.24-25.4 cm (6-10 in) outside 30.48-cm (12-in) walkway width. (1) Moderate impairment-Walks 6 m (20 ft), slow speed, abnormal gait pattern, evidence for imbalance, deviates 25.4-38.1 cm (10-15 in) outside 30.48-cm (12-in) walkway width. (0) Severe impairment-Cannot walk 6 m (20 ft) without assistance, severe gait deviations or imbalance, deviates greater than 38.1 cm (15 in) outside 30.48-cm (12-in) walkway width or will not attempt task.  _3_ 10. STEPS Instructions: Walk up these stairs as you would at home (ie, using the rail if necessary). At the top turn around and walk down. Grading: Loraine Leriche the highest category that applies. (3) Normal-Alternating feet, no rail. (2) Mild impairment-Alternating feet, must use rail. (1) Moderate impairment-Two feet to a stair; must use rail. (0) Severe impairment-Cannot do safely.  TOTAL SCORE: ___27___ /30 (MAXIMUM SCORE=30)  Scores of ? 22/30 on the  FGA were found to be effective in predicting falls, Sensitivity 85%, Specificity 86% Scores of ? 20/30 on the FGA were optimal to predict older adults residing in community dwellings who would sustain unexplained falls in the next 6 months, Sensitivity 100%, Specificity 76% Alvino Chapel(Wrisley & Lucianne MussKumar, 2010; aged 55 to 7590, Older Adults) MDC: 4.2 points for CVA Juel Burrow(Lin et al, 2010) MCID: 8 points for Balance and Vestibular Disorders Levander Campion(Marchetti and Juel BurrowLin, 2014)    Therapy Documentation Precautions:  Precautions Precautions: Fall, Sternal Precaution Comments: Reviewed sternal precautions.   Restrictions Weight Bearing Restrictions: Yes  (sternal precautions) Other Position/Activity Restrictions: Sternal precautions Pain: Pain Assessment Pain Assessment: 0-10 Pain Score: 7  Pain Type: Acute pain Pain Location: Chest Pain Orientation: Mid Pain Descriptors / Indicators: Aching Pain Onset: With Activity Patients Stated Pain Goal: 3 Pain Intervention(s): RN made aware   See Function Navigator for Current Functional Status.   Therapy/Group: Individual Therapy  Kerney ElbeVarner, Nithin Demeo A 11/12/2015, 11:07 AM

## 2015-11-12 NOTE — Discharge Summary (Signed)
Discharge summary job [id number]

## 2015-11-12 NOTE — Progress Notes (Signed)
Subjective/Complaints: Asking about diet  Also had episode of epigastric pain that resolved after passing gas , had some nausea with that episode but no vomiting   Objective: Vital Signs: Blood pressure 117/72, pulse 85, temperature 97.3 F (36.3 C), temperature source Oral, resp. rate 18, weight 102.5 kg (225 lb 15.5 oz), SpO2 99 %. No results found. Results for orders placed or performed during the hospital encounter of 11/05/15 (from the past 72 hour(s))  Creatinine, serum     Status: None   Collection Time: 11/12/15  5:30 AM  Result Value Ref Range   Creatinine, Ser 0.96 0.61 - 1.24 mg/dL   GFR calc non Af Amer >60 >60 mL/min   GFR calc Af Amer >60 >60 mL/min    Comment: (NOTE) The eGFR has been calculated using the CKD EPI equation. This calculation has not been validated in all clinical situations. eGFR's persistently <60 mL/min signify possible Chronic Kidney Disease.       General: No acute distress Mood and affect are appropriate Heart: Regular rate and rhythm no rubs murmurs or extra sounds Lungs: Clear to auscultation, breathing unlabored, no rales or wheezes Abdomen: Positive bowel sounds, soft nontender to palpation, nondistended Extremities: No clubbing, cyanosis, or edema Skin: No evidence of breakdown, no evidence of rash, healing sternotomy wound Neurologic: Cranial nerves II through XII intact, motor strength is 5/5 in bilateral deltoid, bicep, tricep, grip, hip flexor, knee extensors, ankle dorsiflexor and plantar flexor, visual fields Left lower quadrantanopsia Sensory exam normal sensation to light touch and proprioception in bilateral upper and lower extremities Cerebellar exam normal finger to nose to finger as well as heel to shin in bilateral upper and lower extremities Musculoskeletal: Full range of motion in all 4 extremities. No joint swelling   Assessment/Plan: 1. Functional deficits secondary to Left homonymous hemianopsia, gait disorder which  require 3+ hours per day of interdisciplinary therapy in a comprehensive inpatient rehab setting. Physiatrist is providing close team supervision and 24 hour management of active medical problems listed below. Physiatrist and rehab team continue to assess barriers to discharge/monitor patient progress toward functional and medical goals. FIM: Function - Bathing Position: Shower Body parts bathed by patient: Right arm, Right upper leg, Left upper leg, Left arm, Chest, Abdomen, Front perineal area, Left lower leg, Buttocks, Right lower leg Body parts bathed by helper: Back Assist Level: More than reasonable time Set up : To obtain items  Function- Upper Body Dressing/Undressing What is the patient wearing?: Pull over shirt/dress Pull over shirt/dress - Perfomed by patient: Thread/unthread right sleeve, Thread/unthread left sleeve, Put head through opening, Pull shirt over trunk Assist Level: More than reasonable time Set up : To obtain clothing/put away Function - Lower Body Dressing/Undressing What is the patient wearing?: Pants, Non-skid slipper socks Position:  (recliner) Underwear - Performed by patient: Thread/unthread right underwear leg, Thread/unthread left underwear leg, Pull underwear up/down Pants- Performed by patient: Thread/unthread right pants leg, Thread/unthread left pants leg, Pull pants up/down, Fasten/unfasten pants Non-skid slipper socks- Performed by patient: Don/doff right sock, Don/doff left sock Non-skid slipper socks- Performed by helper: Don/doff right sock Assist for footwear: Independent Assist for lower body dressing: More than reasonable time Assistive Device Comment: sock aid Set up : To obtain clothing/put away  Function - Toileting Toileting steps completed by patient: Adjust clothing prior to toileting, Performs perineal hygiene, Adjust clothing after toileting Toileting steps completed by helper: Performs perineal hygiene, Adjust clothing after  toileting Toileting Assistive Devices: Grab bar or rail Assist  level: Supervision or verbal cues  Function - Air cabin crew transfer assistive device: Grab bar Assist level to toilet: No Help, no cues, assistive device, takes more than a reasonable amount of time Assist level from toilet: No Help, no cues, assistive device, takes more than a reasonable amount of time  Function - Chair/bed transfer Chair/bed transfer method: Stand pivot, Ambulatory Chair/bed transfer assist level: Supervision or verbal cues Chair/bed transfer assistive device: Walker Chair/bed transfer details: Verbal cues for safe use of DME/AE  Function - Locomotion: Wheelchair Will patient use wheelchair at discharge?: No Type: Manual Max wheelchair distance: 150 Assist Level: Supervision or verbal cues Assist Level: Supervision or verbal cues Assist Level: Supervision or verbal cues Function - Locomotion: Ambulation Assistive device: Walker-rolling Max distance: 200 Assist level: Supervision or verbal cues Assist level: Supervision or verbal cues Assist level: Supervision or verbal cues Walk 150 feet activity did not occur: Safety/medical concerns Assist level: Supervision or verbal cues Walk 10 feet on uneven surfaces activity did not occur: Safety/medical concerns  Function - Comprehension Comprehension: Auditory Comprehension assist level: Follows complex conversation/direction with no assist  Function - Expression Expression: Verbal Expression assist level: Expresses complex ideas: With no assist  Function - Social Interaction Social Interaction assist level: Interacts appropriately with others - No medications needed.  Function - Problem Solving Problem solving assist level: Solves complex problems: Recognizes & self-corrects  Function - Memory Memory assist level: Complete Independence: No helper Patient normally able to recall (first 3 days only): Current season  Medical Problem  List and Plan: 1. Left hemianopsia, gait deficits, and weakness secondary to right PCA infarct/ status post aortic dissection repair-D/C in am after MD rounds if stable                       2. DVT Prophylaxis/Anticoagulation: Subcutaneous Lovenox. Monitor platelet counts of any signs of bleeding,  3. Pain Management: Oxycodone and as needed -pt with chronic right hip OA and related pain, reviewed Xray 02/24/2015 severe jt space loss  4.Hypertension/Atrial fib.Amiodarone 200 mg BID,Clonidine 0.2 mg BID,Lasix 40 mg daily.Monitor with increased mobility, no clear orthostatic drop.  5. Tobacco /Polysubstance abuse. Counseling 6.  Nutrition- 25-100% meals DIetary consult for HDPD  7.  SHort episode of epigastric pain, gas pain vs GERD, resolved  LOS (Days) 7 A FACE TO FACE EVALUATION WAS PERFORMED  Nadene Witherspoon E 11/12/2015, 7:38 AM

## 2015-11-12 NOTE — Progress Notes (Signed)
Physical Therapy Discharge Summary  Patient Details  Name: [name] MRN: [id number] Date of Birth: [birth year]  Today's Date: 11/12/2015 PT Individual Time: 0900-1000 and 1500-1530 PT Individual Time Calculation (min): 60 min and 30 min (total 90 min)    Patient has met 8 of 8 long term goals due to improved activity tolerance, improved balance, improved postural control, increased strength, decreased pain, ability to compensate for deficits, functional use of  right upper extremity and right lower extremity, improved awareness and improved coordination.  Patient to discharge at an ambulatory level Modified Independent.   Patient's care partner is independent to provide the necessary physical assistance at discharge; plan to have mother provide intermittent S.   Reasons goals not met: All goals met  Recommendation:  Patient will benefit from ongoing skilled PT services in outpatient setting to continue to advance safe functional mobility, address ongoing impairments in balance, strength, coordination, endurance, and minimize fall risk.  Equipment: No equipment provided  Reasons for discharge: treatment goals met  Patient/family agrees with progress made and goals achieved: Yes  PT Discharge Precautions/Restrictions Precautions Precautions: Fall;Sternal Precaution Comments: Reviewed sternal precautions.   Restrictions Weight Bearing Restrictions: Yes (sternal precautions) Vital Signs Oxygen Therapy SpO2: 98 % O2 Device: Not Delivered Pain Pain Assessment Pain Assessment: 0-10 Pain Score: 6  Pain Type: Acute pain Pain Location: Chest Pain Orientation: Mid Pain Descriptors / Indicators: Aching Pain Frequency: Intermittent Pain Onset: On-going Patients Stated Pain Goal: 3 Pain Intervention(s): RN made aware;Rest Vision/Perception  Vision - Assessment Eye Alignment: Within Functional Limits Ocular Range of Motion: Within Functional Limits Alignment/Gaze Preference:  Within Defined Limits Additional Comments: L visual field deficits  Cognition Overall Cognitive Status: Within Functional Limits for tasks assessed Arousal/Alertness: Awake/alert Orientation Level: Oriented X4 Memory: Impaired Memory Impairment: Decreased recall of new information Awareness: Appears intact Problem Solving: Appears intact Safety/Judgment: Appears intact Sensation Sensation Light Touch: Appears Intact Stereognosis: Not tested Hot/Cold: Not tested Proprioception: Appears Intact Coordination Gross Motor Movements are Fluid and Coordinated: Yes Heel Shin Test: [facility] Motor  Motor Motor: Within Functional Limits  Mobility Bed Mobility Bed Mobility: Supine to Sit;Sit to Supine Supine to Sit: 6: Modified independent (Device/Increase time) Sit to Supine: 6: Modified independent (Device/Increase time) Transfers Transfers: Yes Sit to Stand: 6: Modified independent (Device/Increase time);From bed;Without upper extremity assist Stand to Sit: 6: Modified independent (Device/Increase time) Stand Pivot Transfers: 6: Modified independent (Device/Increase time) Locomotion  Ambulation Ambulation: Yes Ambulation/Gait Assistance: 6: Modified independent (Device/Increase time) Ambulation Distance (Feet): 200 Feet Assistive device: Rolling walker Gait Gait: Yes Gait Pattern: Antalgic;Decreased trunk rotation;Decreased stride length;Trendelenburg Stairs / Additional Locomotion Stairs: Yes Wheelchair Mobility Wheelchair Mobility: No  Trunk/Postural Assessment  Cervical Assessment Cervical Assessment: Within Functional Limits Thoracic Assessment Thoracic Assessment: Within Functional Limits Lumbar Assessment Lumbar Assessment: Within Functional Limits Postural Control Postural Control: Deficits on evaluation (delayed righting reactions, stepping strategies for balance recover)  Balance Balance Balance Assessed: Yes Standardized Balance Assessment Standardized Balance  Assessment: Berg Balance Test Berg Balance Test Sit to Stand: Able to stand without using hands and stabilize independently Standing Unsupported: Able to stand safely 2 minutes Sitting with Back Unsupported but Feet Supported on Floor or Stool: Able to sit safely and securely 2 minutes Stand to Sit: Sits safely with minimal use of hands Transfers: Able to transfer safely, minor use of hands Standing Unsupported with Eyes Closed: Able to stand 10 seconds safely Standing Ubsupported with Feet Together: Able to place feet together independently and stand 1 minute  safely From Standing, Reach Forward with Outstretched Arm: Can reach confidently >25 cm (10") From Standing Position, Pick up Object from Floor: Able to pick up shoe, needs supervision From Standing Position, Turn to Look Behind Over each Shoulder: Looks behind one side only/other side shows less weight shift Turn 360 Degrees: Able to turn 360 degrees safely in 4 seconds or less Standing Unsupported, Alternately Place Feet on Step/Stool: Able to stand independently and safely and complete 8 steps in 20 seconds Standing Unsupported, One Foot in Front: Able to plae foot ahead of the other independently and hold 30 seconds Standing on One Leg: Able to lift leg independently and hold equal to or more than 3 seconds Total Score: 51 Static Sitting Balance Static Sitting - Balance Support: No upper extremity supported;Feet supported Static Sitting - Level of Assistance: 6: Modified independent (Device/Increase time) Dynamic Sitting Balance Dynamic Sitting - Balance Support: Feet supported;No upper extremity supported Dynamic Sitting - Level of Assistance: 6: Modified independent (Device/Increase time) Static Standing Balance Static Standing - Balance Support: No upper extremity supported Static Standing - Level of Assistance: 6: Modified independent (Device/Increase time) Dynamic Standing Balance Dynamic Standing - Balance Support: During  functional activity;No upper extremity supported Dynamic Standing - Level of Assistance: 6: Modified independent (Device/Increase time) Extremity Assessment  RUE Assessment RUE Assessment: Within Functional Limits (limited strength testing due to sternal precautions) LUE Assessment LUE Assessment: Within Functional Limits (limited strength testing due to sternal precautions) RLE Assessment RLE Assessment: Within Functional Limits LLE Assessment LLE Assessment: Within Functional Limits  Skilled Therapeutic Intervention: Tx 1: Pt received seated in recliner, denies pain and agreeable to treatment. Performs upper and lower body dressing with mod I. Gait to gym with RW and mod I x200'. Assessed all mobility as described above and in function tab including mobility, transfers, stairs, Berg balance scale, and car transfer. Pt at mod I level and plan to clear for mod I in room today. Discussed continuation of LE HEP for balance, strength and hip pain relief after d/c as anticipate pt will not receive follow up due to lack of insurance. Pt states he plans to continue LE exercises consistently at home as he has notices significant relief in the past few days from new stretching/strengthening HEP. Pt given handout with home safety checklist and information regarding sternal precautions. Handoff to PT Wells Guiles in hall at completion of session.   Tx 2: Pt received seated in recliner, initially declining therapy due to fatigue and friend visiting but agreeable with encouragement and education regarding importance and purpose of session. Gait 2 x 200' with no AD and S. Dynavision to focus on L attention, compensatory head turns for L quadrantinopia, and dynamic standing balance with UE reaching. Demonstrates mild increase in reaction time on L side (1.82 sec) compared to R side (1.5 sec). And increase in reaction time with cognitive dual task reading numbers off screen while hitting lights (1.8-2.0 sec on L  hemisphere). Returned to room with gait as above. Remained seated in recliner at completion of session, all needs within reach.    See Function Navigator for Current Functional Status.  [name] 11/12/2015, 3:37 PM

## 2015-11-12 NOTE — Plan of Care (Signed)
Problem: Food- and Nutrition-Related Knowledge Deficit (NB-1.1) Goal: Nutrition education Formal process to instruct or train a patient/client in a skill or to impart knowledge to help patients/clients voluntarily manage or modify food choices and eating behavior to maintain or improve health.  Outcome: Completed/Met Date Met:  11/12/15 RD consulted for nutrition education regarding a heart healthy diet.    Dietetic Intern provided "Heart Healthy Eating Nutrition Therapy" and "Heart Healthy Eating Label Reading Tip" handout from the Academy of Nutrition and Dietetics.  Discussed low saturated fat, low sodium, and high fiber foods.  Provided a list of tips for reducing sodium intake.   Patient explained that he was doing well on a heart healthy diet at home.  Reports that he enjoys eating tuna, chicken, and vegetables.  States that he has been doing well with meals while in the hospital and was adjusting to the portion sizes. Provided recommendations healthy low sodium snack options and encouraged good intake of high-fiber, whole grain carbohydrates.  Discussed options for seasonings aside from salt. Teach back method used.  Expect fair compliance.  Body mass index is 35.4 kg/(m^2).  Patient meets criteria for obesity based on current BMI.  Current diet order is Heart Healthy/CHO Modified with thin liquids.  Patient is consuming approximately 100% of meals at this time. Labs and medications reviewed.  No further nutrition interventions warranted at this time.  RD contact information provided.  If additional nutrition issues arise, please re-consult RD.

## 2015-11-12 NOTE — Discharge Summary (Signed)
[name], [name]               ACCOUNT NO.:  [id number]  MEDICAL RECORD NO.:  [id number]  LOCATION:  [id number]                        FACILITY:  [facility]  PHYSICIAN:  Erick Colace, M.D.DATE OF BIRTH:  08-Mar-1961  DATE OF ADMISSION:  11/05/2015 DATE OF DISCHARGE:  11/13/2015                              DISCHARGE SUMMARY   DISCHARGE DIAGNOSES: 1. Functional deficits secondary to right posterior cerebral artery     infarct, status post aortic dissection repair. 2. Subcutaneous Lovenox for DVT prophylaxis. 3. Pain management. 4. Hypertension. 5. Tobacco polysubstance abuse. 6. Decreased nutritional storage.  HISTORY OF PRESENT ILLNESS:  This is a 55 year old right-handed male with history of hypertension, tobacco polysubstance abuse with poor medical compliance as well as a known history of type B descending thoracic aortic dissection, diagnosed April, 2016.  He lives with his mother independent prior to admission.  Presented on October 25, 2015, with left hemianopsia as well as headache, some right shoulder discomfort.  CT of the head showed hypodense cortical lesion with mild edema and mass effect in the medial right occipital lobe.  CTA of head and neck showed extensive dissection of thoracic aorta.  No flow demonstrated in the proximal right vertebral artery.  No intracranial large or medium vessel occlusion demonstrated.  Echocardiogram with ejection fraction of 65%, grade 1 diastolic dysfunction.  Carotid Dopplers with no ICA stenosis.  Urine drug screen positive for cocaine. The patient did not receive tPA.  Cardiothoracic Surgery consulted, underwent repair of ascending aortic dissection per Dr. Donata Clay on October 28, 2015.  HOSPITAL COURSE:  Pain management.  Maintained on aspirin therapy. Subcutaneous Lovenox for DVT prophylaxis.  Tolerating a regular diet. Acute blood loss anemia, 10.3 and monitored.  Physical and occupational therapy ongoing.  The patient was  admitted for comprehensive rehab program.  PAST MEDICAL HISTORY:  See discharge diagnoses.  SOCIAL HISTORY:  Lives with mother independent prior to admission. Functional status upon admission to rehab services was mod assist 150 feet rolling walker, minimal assist sit to stand, min-to-mod assist activities of daily living.  PHYSICAL EXAMINATION:  VITAL SIGNS:  Blood pressure 119/64, pulse 90, temperature 98, and respirations 16. GENERAL:  This was an alert male, in no acute distress.  Mood was flat, but appropriate.  Followed simple commands. LUNGS:  Clear to auscultation without wheeze. CARDIAC:  Regular rate and rhythm.  No murmur. ABDOMEN:  Soft and nontender.  Good bowel sounds.  REHABILITATION HOSPITAL COURSE:  The patient was admitted to inpatient rehab services with therapies initiated on a 3-hour daily basis consisting of physical therapy, occupational therapy, and rehabilitation nursing.  The following issues were addressed during the patient's rehabilitation stay.  Pertaining to [name] right PCA infarct after aortic dissection repair remained stable, remained on aspirin therapy. He would follow up with Neurology Services as well as Cardiothoracic Surgery.  The patient with no chest pain or shortness of breath. Subcutaneous Lovenox for DVT prophylaxis.  No bleeding episodes.  Pain management use of oxycodone in good results.  Blood pressure, heart rate control with amiodarone as well as clonidine, no orthostatic changes. He did have a history of tobacco polysubstance abuse.  He received full counseling  in regard to cessation of these products.  It was questionable if he would be compliant with these requests.  The patient received weekly collaborative interdisciplinary team conferences to discuss estimated length of stay, family teaching, and any barriers to his discharge.  He was ambulating, supervision, 150 feet with a rolling walker.  He could perform all  exercises provided, undergoing energy conservation techniques.  Discussed fall preventions.  He could go through obstacle course x5 with supervision.  Up and down stairs, supervision.  Gather belongings for activities of daily living and homemaking.  He could gather all clothing, ambulate to his room and shower without assistive device.  Full family teaching was completed and plan, discharged to home with ongoing therapies, [facility].  DISCHARGE MEDICATIONS: 1. Xanax 0.25 mg p.o. t.i.d. as needed. 2. Amiodarone 200 mg p.o. b.i.d. 3. Symbicort inhaler 2 puffs twice daily. 4. Clonidine 0.2 mg p.o. b.i.d. 5. Colace 200 mg p.o. daily. 6. Lasix 20 mg p.o. daily. 7. Oxycodone immediate release 5 mg every 3 hours as needed, severe     pain, dispense of 60 tablets. 8. Protonix 40 mg p.o. daily. 9. MiraLax 17 g daily. 10. Potassium chloride 20 mEq daily  DIET:  Regular.  FOLLOWUP:  The patient would follow up with Dr. Claudette LawsAndrew Kirsteins at the outpatient rehab center as directed.  Dr. Kathlee NationsPeter Van Trigt, Cardiothoracic Surgery, call for appointment in 2 weeks.  Dr. Roda ShuttersXu, Neurology Services, call for appointment in 1 month.  Dr. Armen PickupFunches, medical management.  The patient was advised no driving, no smoking, no alcohol, no illicit drug products.     Mariam Dollaraniel Angiulli, P.A.   ______________________________ Erick ColaceAndrew E. Kirsteins, M.D.    DA/MEDQ  D:  11/12/2015  T:  11/12/2015  Job:  [id number]  cc:   Erick ColaceAndrew E. Kirsteins, M.D. Dessa PhiJosalyn Funches, MD Dr. Marjo BickerJindong Xu Peter Van Trigt, M.D.

## 2015-11-12 NOTE — Progress Notes (Signed)
Occupational Therapy Discharge Summary  Patient Details  Name: [name] MRN: [id number] Date of Birth: [birth year]  Patient has met 10 of 10 long term goals due to improved activity tolerance, improved balance, postural control, ability to compensate for deficits and improved awareness.  Patient to discharge at overall Modified Independent level, supervision for tub/shower transfers and meal prep.  Patient's care partner is independent to provide the necessary assistance at discharge.    Reasons goals not met: N/A  Recommendation:  Patient will benefit from ongoing skilled OT services in home health setting to continue to advance functional skills in the area of BADL, iADL and Reduce care partner burden.  Equipment: No equipment provided  Reasons for discharge: treatment goals met and discharge from hospital  Patient/family agrees with progress made and goals achieved: Yes  OT Discharge Precautions/Restrictions  Precautions Precautions: Fall;Sternal Precaution Comments: Reviewed sternal precautions.   General OT Amount of Missed Time: 5 Minutes Vital Signs Therapy Vitals Temp: 97.4 F (36.3 C) Temp Source: Oral Pulse Rate: 83 Resp: 18 BP: 111/61 mmHg Patient Position (if appropriate): Sitting Oxygen Therapy SpO2: 99 % O2 Device: Not Delivered Pain Pain Assessment Pain Score: 4  ADL  See Function Navigator Vision/Perception  Vision- History Baseline Vision/History: No visual deficits Patient Visual Report: Peripheral vision impairment Vision- Assessment Vision Assessment?: Yes Eye Alignment: Within Functional Limits Ocular Range of Motion: Within Functional Limits Alignment/Gaze Preference: Within Defined Limits Tracking/Visual Pursuits: Able to track stimulus in all quads without difficulty Visual Fields: Left visual field deficit Additional Comments: Lt visual field deficits  Cognition Overall Cognitive Status: Within Functional Limits for tasks  assessed Arousal/Alertness: Awake/alert Orientation Level: Oriented X4 Memory: Impaired Memory Impairment: Decreased recall of new information Awareness: Appears intact Problem Solving: Appears intact Safety/Judgment: Appears intact Sensation Sensation Light Touch: Appears Intact Stereognosis: Not tested Hot/Cold: Not tested Proprioception: Appears Intact Coordination Gross Motor Movements are Fluid and Coordinated: Yes Fine Motor Movements are Fluid and Coordinated: Yes Finger Nose Finger Test: [facility] Extremity/Trunk Assessment RUE Assessment RUE Assessment: Within Functional Limits (limited strength testing due to sternal precautions) LUE Assessment LUE Assessment: Within Functional Limits (limited strength testing due to sternal precautions)   See Function Navigator for Current Functional Status.  Simonne Come 11/12/2015, 3:15 PM

## 2015-11-12 NOTE — Discharge Instructions (Signed)
Inpatient Rehab Discharge Instructions  Frank PavlovDwayne A Grill Discharge date and time: No discharge date for patient encounter.   Activities/Precautions/ Functional Status: Activity: activity as tolerated Diet: regular diet Wound Care: keep wound clean and dry Functional status:  ___ No restrictions     ___ Walk up steps independently ___ 24/7 supervision/assistance   ___ Walk up steps with assistance ___ Intermittent supervision/assistance  ___ Bathe/dress independently ___ Walk with walker     _x__ Bathe/dress with assistance ___ Walk Independently    ___ Shower independently _x__ Walk with assistance    ___ Shower with assistance ___ No alcohol     ___ Return to work/school ________  Special Instructions:     COMMUNITY REFERRALS UPON DISCHARGE:    Home Health:   PT, OT, RN, SW  Agency:[facility] Phone:[phone]   Date of last service:11/13/2015  Medical Equipment/Items Ordered:HAS ALL NEEDED EQUIPMENT FROM FAMILY [facility]ERS  Other:SSD & MEDICAID APPLICATIONS PENDING DOES NOT FEEL NEEDS SUBSTANCE ABUSE SERVICES AT THIS TIME-AWARE OF THOSE AVAILABLE TO HIM  GENERAL COMMUNITY RESOURCES FOR PATIENT/FAMILY: Support Groups:[facility] SUPPORT GROUP EVERY SECOND Thursday @ 3:00-4;00PM ON REHAB UNIT QUESTIONS CONTACT KATIE [phone]  My questions have been answered and I understand these instructions. I will adhere to these goals and the provided educational materials after my discharge from the hospital.  Patient/Caregiver Signature _______________________________ Date __________  Clinician Signature _______________________________________ Date __________  Please bring this form and your medication list with you to all your follow-up doctor's appointments.  STROKE/TIA DISCHARGE INSTRUCTIONS SMOKING Cigarette smoking nearly doubles your risk of having a stroke & is the single most alterable risk factor  If you smoke or have smoked in the last 12 months, you are advised to quit  smoking for your health.  Most of the excess cardiovascular risk related to smoking disappears within a year of stopping.  Ask you doctor about anti-smoking medications  [place] Quit Line: 1-800-QUIT NOW  Free Smoking Cessation Classes [phone]  CHOLESTEROL Know your levels; limit fat & cholesterol in your diet  Lipid Panel     Component Value Date/Time   CHOL 120 10/27/2015 0333   TRIG 61 10/27/2015 0333   HDL 35* 10/27/2015 0333   CHOLHDL 3.4 10/27/2015 0333   VLDL 12 10/27/2015 0333   LDLCALC 73 10/27/2015 0333      Many patients benefit from treatment even if their cholesterol is at goal.  Goal: Total Cholesterol (CHOL) less than 160  Goal:  Triglycerides (TRIG) less than 150  Goal:  HDL greater than 40  Goal:  LDL (LDLCALC) less than 100   BLOOD PRESSURE American Stroke Association blood pressure target is less that 120/80 mm/Hg  Your discharge blood pressure is:  BP: 109/70 mmHg  Monitor your blood pressure  Limit your salt and alcohol intake  Many individuals will require more than one medication for high blood pressure  DIABETES (A1c is a blood sugar average for last 3 months) Goal HGBA1c is under 7% (HBGA1c is blood sugar average for last 3 months)  Diabetes: No known diagnosis of diabetes    Lab Results  Component Value Date   HGBA1C 5.8* 10/27/2015     Your HGBA1c can be lowered with medications, healthy diet, and exercise.  Check your blood sugar as directed by your physician  Call your physician if you experience unexplained or low blood sugars.  PHYSICAL ACTIVITY/REHABILITATION Goal is 30 minutes at least 4 days per week  Activity: Increase activity slowly, Therapies: Physical Therapy: Home  Health Return to work:   Activity decreases your risk of heart attack and stroke and makes your heart stronger.  It helps control your weight and blood pressure; helps you relax and can improve your mood.  Participate in a regular exercise program.  Talk  with your doctor about the best form of exercise for you (dancing, walking, swimming, cycling).  DIET/WEIGHT Goal is to maintain a healthy weight  Your discharge diet is: Diet heart healthy/carb modified Room service appropriate?: Yes; Fluid consistency:: Thin  liquids Your height is:    Your current weight is: Weight: 105.733 kg (233 lb 1.6 oz) Your Body Mass Index (BMI) is:     Following the type of diet specifically designed for you will help prevent another stroke.  Your goal weight range is:    Your goal Body Mass Index (BMI) is 19-24.  Healthy food habits can help reduce 3 risk factors for stroke:  High cholesterol, hypertension, and excess weight.  RESOURCES Stroke/Support Group:  Call [phone]   STROKE EDUCATION PROVIDED/REVIEWED AND GIVEN TO PATIENT Stroke warning signs and symptoms How to activate emergency medical system (call 911). Medications prescribed at discharge. Need for follow-up after discharge. Personal risk factors for stroke. Pneumonia vaccine given:  Flu vaccine given:  My questions have been answered, the writing is legible, and I understand these instructions.  I will adhere to these goals & educational materials that have been provided to me after my discharge from the hospital.

## 2015-11-12 NOTE — Progress Notes (Signed)
Social Work Patient ID: [name], male   DOB: [birth year], 55 y.o.   MRN: [id number] Met with pt along with Dan-PA to inform him the prescriptions need to be taken to the pharmacy and then picked up before 6:00 when they close. He reports his brother will pick them up and take them over to [facility] And then his Mom will pick them up when ready, so he will have them for discharge tomorrow.

## 2015-11-12 NOTE — Progress Notes (Signed)
Social Work  Discharge Note  The overall goal for the admission was met for:   Discharge location: Yes-HOME WITH MOM WHO CAN PROVIDE INTERMITTENT SUPERVISION  Length of Stay: Yes-8 DAYS  Discharge activity level: Yes-MOD/I LEVEL  Home/community participation: Yes  Services provided included: MD, RD, PT, OT, SLP, RN, CM, TR, Pharmacy, Neuropsych and SW  Financial Services: Other: PENDING MEDICAID  Follow-up services arranged: Home Health: [facility]-PT,OT,RN,SW and Patient/Family has no preference for HH/DME agencies  Comments (or additional information):PT DID VERY WELL AND MAKE GOOD PROGRESS WHILE HERE. APPLIED FOR SSD AND MEDICAID-BOTH PENDING. DOES NOT FEEL AT THIS TIME HE NEEDS SUBSTANCE ABUSE SERVICES HE FEELS HE HAS LEARNED HIS LESSON AND IS GETTING BACK TO GOD. HE IS AWARE OF THE SERVICES AVAILABLE TO HIM IF HE CHANGES HIS MIND  Patient/Family verbalized understanding of follow-up arrangements: Yes  Individual responsible for coordination of the follow-up plan: SELF & MARY-MOM  Confirmed correct DME delivered: Elease Hashimoto 11/12/2015    Elease Hashimoto

## 2015-11-12 NOTE — Progress Notes (Signed)
Social Work Patient ID: [name], male   DOB: 01/03/1961, 55 y.o.   MRN: [id number] Spoke with Dan-PA and pt someone will need to get his medications from [facility] today due to pharmacy is not open tomorrow when pt is discharged. He is aware how important his medications are for his continued recovery. He will find out after his PT session.

## 2015-11-12 NOTE — Progress Notes (Signed)
Occupational Therapy Session Note  Patient Details  Name: [name] MRN: [id number] Date of Birth: 08/17/1961  Today's Date: 11/12/2015 OT Individual Time: 1405-1500 OT Individual Time Calculation (min): 55 min    Short Term Goals: Week 1:  OT Short Term Goal 1 (Week 1): STG=LTG due to LOS  Skilled Therapeutic Interventions/Progress Updates:   Pt initially scheduled for 1300, however had to be rescheduled secondary to social services worker present.  Completed ADL retraining at overall Mod I level with pt able to complete walk-in shower transfer and all bathing/dressing tasks without assist and demonstrating recall of education for energy conservation strategies.  Ambulated to ADL apt without AD at overall Mod I level.  Engaged in simulated meal prep activity with focus on Lt visual attention in regards to safety with burners and hot appliances as well as reading directions.  Recommend supervision with meal prep at this time due to Lt visual field cut and recommending simple one pot meals at a time.  Provided pt with energy conservation strategies handout with pt able to verbalize understanding.  Therapy Documentation Precautions:  Precautions Precautions: Fall, Sternal Precaution Comments: Reviewed sternal precautions.   Restrictions Weight Bearing Restrictions: Yes (sternal precautions) Other Position/Activity Restrictions: Sternal precautions General:   Vital Signs: Therapy Vitals Temp: 97.4 F (36.3 C) Temp Source: Oral Pulse Rate: 83 Resp: 18 BP: 111/61 mmHg Patient Position (if appropriate): Sitting Oxygen Therapy SpO2: 99 % O2 Device: Not Delivered Pain: Pain Assessment Pain Score: 4   See Function Navigator for Current Functional Status.   Therapy/Group: Individual Therapy  [name], [name] 11/12/2015, 3:04 PM

## 2015-11-13 NOTE — Progress Notes (Signed)
Subjective/Complaints: Appreciate dietary note Objective: Vital Signs: Blood pressure 115/53, pulse 82, temperature 98.2 F (36.8 C), temperature source Oral, resp. rate 18, weight 105.416 kg (232 lb 6.4 oz), SpO2 97 %. No results found. Results for orders placed or performed during the hospital encounter of 11/05/15 (from the past 72 hour(s))  Creatinine, serum     Status: None   Collection Time: 11/12/15  5:30 AM  Result Value Ref Range   Creatinine, Ser 0.96 0.61 - 1.24 mg/dL   GFR calc non Af Amer >60 >60 mL/min   GFR calc Af Amer >60 >60 mL/min    Comment: (NOTE) The eGFR has been calculated using the CKD EPI equation. This calculation has not been validated in all clinical situations. eGFR's persistently <60 mL/min signify possible Chronic Kidney Disease.       General: No acute distress Mood and affect are appropriate Heart: Regular rate and rhythm no rubs murmurs or extra sounds Lungs: Clear to auscultation, breathing unlabored, no rales or wheezes Abdomen: Positive bowel sounds, soft nontender to palpation, nondistended Extremities: No clubbing, cyanosis, or edema Skin: No evidence of breakdown, no evidence of rash, healing sternotomy wound Neurologic: Cranial nerves II through XII intact, motor strength is 5/5 in bilateral deltoid, bicep, tricep, grip, hip flexor, knee extensors, ankle dorsiflexor and plantar flexor, visual fields Left lower quadrantanopsia Sensory exam normal sensation to light touch and proprioception in bilateral upper and lower extremities Cerebellar exam normal finger to nose to finger as well as heel to shin in bilateral upper and lower extremities Musculoskeletal: Full range of motion in all 4 extremities. No joint swelling   Assessment/Plan: 1. Functional deficits secondary to Left homonymous hemianopsia, gait disorder  Stable for D/C today F/u PCP in 3-4 weeks F/u PM&R 2 weeks See D/C summary See D/C instructions FIM: Function -  Bathing Position: Shower Body parts bathed by patient: Right arm, Right upper leg, Left upper leg, Left arm, Chest, Abdomen, Front perineal area, Left lower leg, Buttocks, Right lower leg, Back Body parts bathed by helper: Back Assist Level: More than reasonable time Set up : To obtain items  Function- Upper Body Dressing/Undressing What is the patient wearing?: Pull over shirt/dress Pull over shirt/dress - Perfomed by patient: Thread/unthread right sleeve, Thread/unthread left sleeve, Put head through opening, Pull shirt over trunk Assist Level: More than reasonable time Set up : To obtain clothing/put away Function - Lower Body Dressing/Undressing What is the patient wearing?: Pants, Non-skid slipper socks, Underwear Position:  (recliner) Underwear - Performed by patient: Thread/unthread right underwear leg, Thread/unthread left underwear leg, Pull underwear up/down Pants- Performed by patient: Thread/unthread right pants leg, Thread/unthread left pants leg, Pull pants up/down, Fasten/unfasten pants Non-skid slipper socks- Performed by patient: Don/doff right sock, Don/doff left sock Non-skid slipper socks- Performed by helper: Don/doff right sock Assist for footwear: Independent Assist for lower body dressing: More than reasonable time Assistive Device Comment: sock aid Set up : To obtain clothing/put away  Function - Toileting Toileting steps completed by patient: Adjust clothing prior to toileting, Performs perineal hygiene, Adjust clothing after toileting Toileting steps completed by helper: Performs perineal hygiene, Adjust clothing after toileting Toileting Assistive Devices: Grab bar or rail Assist level: More than reasonable time  Function - Toilet Transfers Toilet transfer assistive device: Grab bar Assist level to toilet: No Help, no cues, assistive device, takes more than a reasonable amount of time Assist level from toilet: No Help, no cues, assistive device, takes more  than a reasonable amount  of time  Function - Chair/bed transfer Chair/bed transfer method: Ambulatory Chair/bed transfer assist level: No Help, no cues, assistive device, takes more than a reasonable amount of time Chair/bed transfer assistive device: Walker Chair/bed transfer details: Verbal cues for safe use of DME/AE  Function - Locomotion: Wheelchair Will patient use wheelchair at discharge?: No Type: Manual Max wheelchair distance: 150 Assist Level: Supervision or verbal cues Assist Level: Supervision or verbal cues Assist Level: Supervision or verbal cues Function - Locomotion: Ambulation Assistive device: Walker-rolling Max distance: 200 Assist level: No help, No cues, assistive device, takes more than a reasonable amount of time Assist level: No help, No cues, assistive device, takes more than a reasonable amount of time Assist level: No help, No cues, assistive device, takes more than a reasonable amount of time Walk 150 feet activity did not occur: Safety/medical concerns Assist level: No help, No cues, assistive device, takes more than a reasonable amount of time Walk 10 feet on uneven surfaces activity did not occur: Safety/medical concerns Assist level: Supervision or verbal cues  Function - Comprehension Comprehension: Auditory Comprehension assist level: Follows complex conversation/direction with no assist  Function - Expression Expression: Verbal Expression assist level: Expresses complex ideas: With no assist  Function - Social Interaction Social Interaction assist level: Interacts appropriately with others - No medications needed.  Function - Problem Solving Problem solving assist level: Solves complex problems: Recognizes & self-corrects  Function - Memory Memory assist level: Complete Independence: No helper Patient normally able to recall (first 3 days only): Current season  Medical Problem List and Plan: 1. Left hemianopsia, gait deficits, and  weakness secondary to right PCA infarct/ status post aortic dissection repair-D/C                      2. DVT Prophylaxis/Anticoagulation: Subcutaneous Lovenox. Monitor platelet counts of any signs of bleeding,  3. Pain Management: Oxycodone and as needed -pt with chronic right hip OA and related pain, reviewed Xray 02/24/2015 severe jt space loss  4.Hypertension/Atrial fib.Amiodarone 200 mg BID,Clonidine 0.2 mg BID,Lasix 40 mg daily.Monitor with increased mobility, no clear orthostatic drop.  5. Tobacco /Polysubstance abuse. Counseling 6.  Nutrition- 25-100% meals DIetary consult for HDPD completed  LOS (Days) 8 A FACE TO FACE EVALUATION WAS PERFORMED  Joselyne Spake E 11/13/2015, 9:14 AM

## 2015-11-13 NOTE — Progress Notes (Signed)
11/13/15 1046 nursing  Patient discharged to home per wheel chair accompanied by NT. Patient took morning meds . Requested a copy of AVS per patient he did not receive anything yesterday . Noted medications taken this morning and medications to be taken at home. Explained his discharged instructions and follow up. No further questions noted. Per patient he did not have any pneumonia and flu shot. He refuses them.

## 2015-11-16 ENCOUNTER — Telehealth: Payer: Self-pay | Admitting: Physical Medicine & Rehabilitation

## 2015-11-16 NOTE — Telephone Encounter (Signed)
Spoke with patient Constipation started three days. Been taking mirilax for three days. Starting to get some relief.   1. Are you/is patient experiencing any problems since coming home? Are there any questions regarding any aspect of care? No 2. Are there any questions regarding medications administration/dosing? Are meds being taken as prescribed?  Yes 3. Have there been any falls? No 4. Has Home Health been to the house and/or have they contacted you? If not, have you tried to contact them? Can we help you contact them? 5. Are bowels and bladder emptying properly? Are there any unexpected incontinence issues? If applicable, is patient following bowel/bladder programs? Constipation 6. Any fevers, problems with breathing, unexpected pain? In feet 7. Are there any skin problems or new areas of breakdown? No more than what is healing 8. Has the patient/family member arranged specialty MD follow up (ie cardiology/neurology/renal/surgical/etc)?  Can we help arrange? Most were already made before he left hospital 9. Does the patient need any other services or support that we can help arrange? 10. Are caregivers following through as expected in assisting the patient? Yes- mother  Noticing his feet are tingling at night Constipation

## 2015-11-23 ENCOUNTER — Other Ambulatory Visit: Payer: Self-pay | Admitting: Cardiothoracic Surgery

## 2015-11-23 DIAGNOSIS — I7100 Dissection of unspecified site of aorta: Secondary | ICD-10-CM

## 2015-11-24 ENCOUNTER — Encounter (HOSPITAL_COMMUNITY): Payer: Self-pay | Admitting: Cardiothoracic Surgery

## 2015-11-24 ENCOUNTER — Ambulatory Visit: Payer: Self-pay | Admitting: Cardiothoracic Surgery

## 2015-11-25 ENCOUNTER — Inpatient Hospital Stay: Payer: Medicaid Other | Admitting: Physical Medicine & Rehabilitation

## 2015-11-25 ENCOUNTER — Encounter: Payer: Self-pay | Admitting: Family Medicine

## 2015-11-25 ENCOUNTER — Encounter: Payer: Medicaid Other | Attending: Physical Medicine & Rehabilitation

## 2015-11-25 ENCOUNTER — Ambulatory Visit: Payer: Medicaid Other | Attending: Family Medicine | Admitting: Family Medicine

## 2015-11-25 VITALS — BP 119/79 | HR 94 | Temp 97.7°F | Resp 16 | Ht 68.0 in | Wt 231.0 lb

## 2015-11-25 DIAGNOSIS — F419 Anxiety disorder, unspecified: Secondary | ICD-10-CM | POA: Insufficient documentation

## 2015-11-25 DIAGNOSIS — Z8673 Personal history of transient ischemic attack (TIA), and cerebral infarction without residual deficits: Secondary | ICD-10-CM | POA: Insufficient documentation

## 2015-11-25 DIAGNOSIS — Z79899 Other long term (current) drug therapy: Secondary | ICD-10-CM | POA: Diagnosis not present

## 2015-11-25 DIAGNOSIS — H53461 Homonymous bilateral field defects, right side: Secondary | ICD-10-CM | POA: Diagnosis not present

## 2015-11-25 DIAGNOSIS — I69354 Hemiplegia and hemiparesis following cerebral infarction affecting left non-dominant side: Secondary | ICD-10-CM | POA: Diagnosis not present

## 2015-11-25 DIAGNOSIS — Z48812 Encounter for surgical aftercare following surgery on the circulatory system: Secondary | ICD-10-CM | POA: Diagnosis not present

## 2015-11-25 DIAGNOSIS — I712 Thoracic aortic aneurysm, without rupture: Secondary | ICD-10-CM | POA: Diagnosis not present

## 2015-11-25 DIAGNOSIS — I7101 Dissection of thoracic aorta: Secondary | ICD-10-CM | POA: Insufficient documentation

## 2015-11-25 DIAGNOSIS — F17200 Nicotine dependence, unspecified, uncomplicated: Secondary | ICD-10-CM | POA: Diagnosis not present

## 2015-11-25 DIAGNOSIS — I69398 Other sequelae of cerebral infarction: Secondary | ICD-10-CM | POA: Diagnosis not present

## 2015-11-25 DIAGNOSIS — I10 Essential (primary) hypertension: Secondary | ICD-10-CM | POA: Diagnosis present

## 2015-11-25 DIAGNOSIS — Z7982 Long term (current) use of aspirin: Secondary | ICD-10-CM | POA: Insufficient documentation

## 2015-11-25 DIAGNOSIS — F411 Generalized anxiety disorder: Secondary | ICD-10-CM

## 2015-11-25 DIAGNOSIS — I63531 Cerebral infarction due to unspecified occlusion or stenosis of right posterior cerebral artery: Secondary | ICD-10-CM

## 2015-11-25 DIAGNOSIS — H53462 Homonymous bilateral field defects, left side: Secondary | ICD-10-CM | POA: Insufficient documentation

## 2015-11-25 DIAGNOSIS — F17210 Nicotine dependence, cigarettes, uncomplicated: Secondary | ICD-10-CM | POA: Insufficient documentation

## 2015-11-25 DIAGNOSIS — Z9889 Other specified postprocedural states: Secondary | ICD-10-CM | POA: Insufficient documentation

## 2015-11-25 MED ORDER — ALPRAZOLAM 0.25 MG PO TABS: 0.2500 mg | ORAL_TABLET | Freq: Two times a day (BID) | ORAL | Status: DC | PRN

## 2015-11-25 MED FILL — ALPRAZolam 0.25 MG TABS: 0.25 | 15 days supply | Qty: 30 | Fill #0

## 2015-11-25 NOTE — Progress Notes (Signed)
HFU Heart surgery  Pain scale #6 Former smoker  No suicidal thoughts in the past two weeks   Requesting refill Rx Xanax

## 2015-11-25 NOTE — Assessment & Plan Note (Signed)
A; stable exam P: Daily ASA SCAT form completed as patient's new limitation is left homonymous hemianopsia

## 2015-11-25 NOTE — Assessment & Plan Note (Signed)
A: BP well controlled P: Continue current regimen  

## 2015-11-25 NOTE — Progress Notes (Signed)
Subjective:  Patient ID: [name], male    DOB: 11/16/1960  Age: 55 y.o. MRN: [id number]  CC: Hospitalization Follow-up   HPI Kitt A Delford FieldWright presents for    1. HFU stroke due to thoracic aortic dissection in the setting of HTN and cocaine use: patient hospitalized from 2/24-11/13/15. He is s/p thoracic aortic aneurysm dissection repair. He still has decreased L peripheral vision. He developed some L sided CP last night following overhead lifting. No CP, swelling, severe CP. He is compliant with all meds except for ASA, stating that he overlooked it. He called neuro for HFU but is waiting for medicaid as he cannot afford the $200 co-pay. He has not called neuro rehab/[facility] for f/u yet. He denies smoking, cocaine and marijuana use.    Request refill of xanax for anxiety Requesting SCAT for completion Has food stamp application, requested completion but this is for him to fill out.   Past Surgical History  Procedure Laterality Date  . Rectal abscess    . Thoracic aortic aneurysm repair N/A 10/28/2015    Procedure: TYPE A AORTIC DISSECTION REPAIR WITH CIRC ARREST;  Surgeon: Kerin PernaPeter Van Trigt, MD;  [facility];  Service: Open Heart Surgery;  Laterality: N/A;  . Tee without cardioversion N/A 10/28/2015    Procedure: TRANSESOPHAGEAL ECHOCARDIOGRAM (TEE);  Surgeon: Kerin PernaPeter Van Trigt, MD;  [facility];  Service: Open Heart Surgery;  Laterality: N/A;   Social History  Substance Use Topics  . Smoking status: Current Every Day Smoker    Types: Cigarettes  . Smokeless tobacco: Not on file  . Alcohol Use: No    Outpatient Prescriptions Prior to Visit  Medication Sig Dispense Refill  . ALPRAZolam (XANAX) 0.25 MG tablet Take 1 tablet (0.25 mg total) by mouth 2 (two) times daily as needed for anxiety. 30 tablet 0  . amiodarone (PACERONE) 200 MG tablet Take 1 tablet (200 mg total) by mouth 2 (two) times daily. 60 tablet 1  . aspirin EC 325 MG EC tablet Take 1 tablet (325 mg total) by mouth  daily. 30 tablet 0  . budesonide-formoterol (SYMBICORT) 160-4.5 MCG/ACT inhaler Inhale 2 puffs into the lungs 2 (two) times daily. 1 Inhaler 12  . cloNIDine (CATAPRES) 0.2 MG tablet Take 1 tablet (0.2 mg total) by mouth 2 (two) times daily. 60 tablet 1  . docusate sodium (COLACE) 100 MG capsule Take 2 capsules (200 mg total) by mouth daily. 10 capsule 0  . furosemide (LASIX) 20 MG tablet Take 1 tablet (20 mg total) by mouth daily. 30 tablet 1  . oxyCODONE (OXY IR/ROXICODONE) 5 MG immediate release tablet Take 1 tablet (5 mg total) by mouth every 3 (three) hours as needed for severe pain. 60 tablet 0  . pantoprazole (PROTONIX) 40 MG tablet Take 1 tablet (40 mg total) by mouth daily. 30 tablet 1  . polyethylene glycol (MIRALAX / GLYCOLAX) packet Take 17 g by mouth daily as needed. 14 each 0  . potassium chloride SA (K-DUR,KLOR-CON) 20 MEQ tablet Take 1 tablet (20 mEq total) by mouth daily. 30 tablet 1   No facility-administered medications prior to visit.    ROS Review of Systems  Constitutional: Negative for fever, chills, fatigue and unexpected weight change.  Eyes: Negative for visual disturbance.  Respiratory: Negative for cough and shortness of breath.   Cardiovascular: Positive for chest pain. Negative for palpitations and leg swelling.  Gastrointestinal: Negative for nausea, vomiting, abdominal pain, diarrhea, constipation and blood in stool.  Endocrine:  Negative for polydipsia, polyphagia and polyuria.  Musculoskeletal: Positive for myalgias. Negative for back pain, arthralgias, gait problem and neck pain.  Skin: Negative for rash.  Allergic/Immunologic: Negative for immunocompromised state.  Hematological: Negative for adenopathy. Does not bruise/bleed easily.  Psychiatric/Behavioral: Negative for suicidal ideas, sleep disturbance and dysphoric mood. The patient is not nervous/anxious.     Objective:  BP 119/79 mmHg  Pulse 94  Temp(Src) 97.7 F (36.5 C) (Oral)  Resp 16  Ht 5'  8" (1.727 m)  Wt 231 lb (104.781 kg)  BMI 35.13 kg/m2  SpO2 97%  BP/Weight 11/25/2015 11/13/2015 11/12/2015  Systolic BP 119 - 115  Diastolic BP 79 - 53  Wt. (Lbs) 231 232.4 -  BMI 35.13 - -   Physical Exam  Constitutional: He appears well-developed and well-nourished. No distress.  HENT:  Head: Normocephalic and atraumatic.  Eyes: EOM are normal. Pupils are equal, round, and reactive to light.  Neck: Normal range of motion. Neck supple.  Cardiovascular: Normal rate, regular rhythm, normal heart sounds and intact distal pulses.   Pulmonary/Chest: Effort normal and breath sounds normal.    Musculoskeletal: He exhibits no edema.  Neurological: He is alert.  Skin: Skin is warm and dry. No rash noted. No erythema.  Psychiatric: He has a normal mood and affect.     Assessment & Plan:   There are no diagnoses linked to this encounter.  No orders of the defined types were placed in this encounter.    Follow-up: No Follow-up on file.   Dessa Phi MD

## 2015-11-25 NOTE — Patient Instructions (Signed)
Rannie was seen today for hospitalization follow-up.  Diagnoses and all orders for this visit:  Generalized anxiety disorder -     ALPRAZolam (XANAX) 0.25 MG tablet; Take 1 tablet (0.25 mg total) by mouth 2 (two) times daily as needed for anxiety.   Avoid smoking, alcohol, cocaine BP is great, keep it under control Be sure to take aspirin   Be sure to call and follow up with Drs. Kirstein and [employer] Tright  F/u with me in 3 months for HTN   Dr. Armen PickupFunches

## 2015-11-30 ENCOUNTER — Telehealth: Payer: Self-pay | Admitting: Family Medicine

## 2015-11-30 NOTE — Telephone Encounter (Signed)
Pt was here Thursday 11/25/15 and Dr Armen PickupFunches filled out a paperwork for scat transportation And pt lost the paperwork and he is wondering if his PCP can write him  another one either call him to pick up. Please f/u

## 2015-11-30 NOTE — Telephone Encounter (Signed)
Pt was here Thursday  11/25/15 and Dr Armen PickupFunches fill out a paper for scat transportation  And pt lost the paper and he is wonder if can be write another one either call him to pick up  Thank you  . Please, call him at  [phone] thanks

## 2015-12-01 ENCOUNTER — Telehealth: Payer: Self-pay | Admitting: Family Medicine

## 2015-12-01 NOTE — Telephone Encounter (Signed)
[name] from [facility] called to request verbal orders to continue visiting the pt. She also stated that pt. incision opened up and puss was coming out. Please f/u

## 2015-12-01 NOTE — Telephone Encounter (Signed)
Pt was here Thursday  11/25/15 and Dr Funches filled out a paperwork for scat transportation  And pt lost the paperwork and he is wondering if his PCP can write him  another one either call him to pick up. Please f/u °

## 2015-12-02 ENCOUNTER — Ambulatory Visit
Admission: RE | Admit: 2015-12-02 | Discharge: 2015-12-02 | Disposition: A | Discharge status: Home / Self Care | Payer: No Typology Code available for payment source | Source: Ambulatory Visit | Attending: Cardiothoracic Surgery | Admitting: Cardiothoracic Surgery

## 2015-12-02 ENCOUNTER — Ambulatory Visit (INDEPENDENT_AMBULATORY_CARE_PROVIDER_SITE_OTHER): Payer: Self-pay | Admitting: Cardiothoracic Surgery

## 2015-12-02 ENCOUNTER — Encounter: Payer: Self-pay | Admitting: Cardiothoracic Surgery

## 2015-12-02 VITALS — BP 145/88 | HR 80 | Resp 20 | Ht 68.0 in | Wt 232.0 lb

## 2015-12-02 DIAGNOSIS — H53462 Homonymous bilateral field defects, left side: Secondary | ICD-10-CM

## 2015-12-02 DIAGNOSIS — I71019 Dissection of thoracic aorta, unspecified: Secondary | ICD-10-CM

## 2015-12-02 DIAGNOSIS — I7100 Dissection of unspecified site of aorta: Secondary | ICD-10-CM

## 2015-12-02 DIAGNOSIS — I63531 Cerebral infarction due to unspecified occlusion or stenosis of right posterior cerebral artery: Secondary | ICD-10-CM

## 2015-12-02 DIAGNOSIS — I7101 Dissection of thoracic aorta: Secondary | ICD-10-CM

## 2015-12-02 DIAGNOSIS — Z9889 Other specified postprocedural states: Secondary | ICD-10-CM

## 2015-12-02 NOTE — Progress Notes (Signed)
PCP is Lora PaulaFUNCHES, JOSALYN C, MD Referring Provider is Lorre NickAllen, Anthony, MD  Chief Complaint  Patient presents with  . Routine Post Op    f/u from surgery with CXR s/p urgent repair of type 1 dissection    ZOX:[id number]:patient presents for scheduled one month followup after urgent repair of [place] type A dissection of the ascending aorta with a Hemashield straight graft, hemi-arch replacement and resuspension of the aortic valve under hypothermic circulatory arrest. The patient presented to the emergency department with hypertension and left visual field cut. Head CT scan showed right posterior circulation CVA. The patient socially had a CT scan of the chest which demonstrated a type a dissection  The patient recover well after his dissection repair. He had some transient atrial fibrillation which converted to sinus rhythm on amiodarone.his LV function was well preserved. He was discharged to the inpatient rehabilitation program to help with his visual field deficit which did not significantly improved after surgery. He required occupational therapy. He is currently living at home. His blood pressure is well-controlled on oral clonidine. He is maintaining sinus rhythm. He complains of mild expected postoperative surgical discomfort. He also complains of exertional shortness of breath most likely secondary to deconditioning and obesity. 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 The patient is waiting to see a neurologist as an outpatient and has an appointment pending. We will  Direct the patient tooutpatient occupational therapy as well to assist with his ADLs and vision loss.  Chest x-ray today shows clear lung fields, no pleural effusions, and cardiac silhouette stable.   Past Medical History  Diagnosis Date  . Malignant hypertension Dx 2016  . Aortic dissection, thoracic [facility](HCC)     Past  Surgical History  Procedure Laterality Date  . Rectal abscess    . Thoracic aortic aneurysm repair N/A 10/28/2015    Procedure: TYPE A AORTIC DISSECTION REPAIR WITH CIRC ARREST;  Surgeon: Kerin PernaPeter Van Trigt, MD;  [facility];  Service: Open Heart Surgery;  Laterality: N/A;  . Tee without cardioversion N/A 10/28/2015    Procedure: TRANSESOPHAGEAL ECHOCARDIOGRAM (TEE);  Surgeon: Kerin PernaPeter Van Trigt, MD;  [facility];  Service: Open Heart Surgery;  Laterality: N/A;    Family History  Problem Relation Age of Onset  . Diabetes Father   . Heart disease Father   . Dementia Father     Social History Social History  Substance Use Topics  . Smoking status: Current Every Day Smoker    Types: Cigarettes  . Smokeless tobacco: None  . Alcohol Use: No    Current Outpatient Prescriptions  Medication Sig Dispense Refill  . ALPRAZolam (XANAX) 0.25 MG tablet Take 1 tablet (0.25 mg total) by mouth 2 (two) times daily as needed for anxiety. 30 tablet 0  . amiodarone (PACERONE) 200 MG tablet Take 1 tablet (200 mg total) by mouth 2 (two) times daily. 60 tablet 1  . aspirin EC 325 MG EC tablet Take 1 tablet (325 mg total) by mouth daily. 30 tablet 0  . budesonide-formoterol (SYMBICORT) 160-4.5 MCG/ACT inhaler Inhale 2 puffs into the lungs 2 (two) times daily. 1 Inhaler 12  . cloNIDine (CATAPRES) 0.2 MG tablet Take 1 tablet (0.2 mg total) by mouth 2 (two) times daily. 60 tablet 1  . docusate sodium (COLACE) 100 MG capsule Take 2 capsules (200 mg total) by mouth daily. 10 capsule 0  .  furosemide (LASIX) 20 MG tablet Take 1 tablet (20 mg total) by mouth daily. 30 tablet 1  . oxyCODONE (OXY IR/ROXICODONE) 5 MG immediate release tablet Take 1 tablet (5 mg total) by mouth every 3 (three) hours as needed for severe pain. 60 tablet 0  . pantoprazole (PROTONIX) 40 MG tablet Take 1 tablet (40 mg total) by mouth daily. 30 tablet 1  . polyethylene glycol (MIRALAX / GLYCOLAX) packet Take 17 g by mouth daily as needed.  14 each 0  . potassium chloride SA (K-DUR,KLOR-CON) 20 MEQ tablet Take 1 tablet (20 mEq total) by mouth daily. 30 tablet 1   No current facility-administered medications for this visit.    No Known Allergies  Review of Systems   Complains of insomnia, poor appetite, poor exercise tolerance and mild chest wall discomfort. Complains of vision loss of his left visual field.  BP 145/88 mmHg  Pulse 80  Resp 20  Ht  (1.727 m)  Wt 232 lb (105.235 kg)  BMI 35.28 kg/m2  SpO2 97% Physical Exam Alert and comfortable Lungs clear Sternal incision well-healed Heart rhythm regular murmur or gallop No pedal edema Left groin incision at femoral artery cannulation site well-healed No vision to the left of midline otherwise neuro exam intact  Diagnostic Tests: Chest x-ray clear  Impression: Good initial recovery after repair of type A dissection Significant persistent left visual field cut from preoperative CVA--patient will probably not regain his normal vision and will be disabled from employment.  Plan:patient was stressed the importance of good blood pressure control and to obtain from smoking and to abstain from cocaine drug use. He was encouraged to take a 20 minute walk daily.  He will stop the amiodarone when the prescription runs out  Patient will return in 6-8 weeks for blood pressure check and to assess his progress. Will try to track into outpatient occupational therapy to assist with his visual deficit.   Mikey Bussing, MD [facility] [phone]

## 2015-12-06 ENCOUNTER — Telehealth: Payer: Self-pay | Admitting: Family Medicine

## 2015-12-06 NOTE — Telephone Encounter (Signed)
Patient called requesting a letter for Food Stamp.s Patient needs a letter stating health conditions, which don't allow him to work. Please follow up.

## 2015-12-06 NOTE — Telephone Encounter (Signed)
After his last appointment with us, Dr Donata ClayVan Trigt requested a referral be made to OP REHAB to work with O.T.  I noted that Mr. Delford FieldWright had missed his follow up appointment with Dr. Wynn BankerKirsteins after discharge.  He was set up with [facility] for skilled nursing and PT per his mother. I told her to please call and reshedule his appointment with rehab.  I will call [facility] and request an OT consult if not already included in his care. She agreed with this plan.

## 2015-12-07 ENCOUNTER — Ambulatory Visit (HOSPITAL_BASED_OUTPATIENT_CLINIC_OR_DEPARTMENT_OTHER): Payer: Medicaid Other | Admitting: Physical Medicine & Rehabilitation

## 2015-12-07 ENCOUNTER — Encounter: Payer: Self-pay | Admitting: Physical Medicine & Rehabilitation

## 2015-12-07 ENCOUNTER — Ambulatory Visit: Payer: Self-pay

## 2015-12-07 VITALS — BP 137/82 | HR 80 | Resp 14

## 2015-12-07 DIAGNOSIS — F17210 Nicotine dependence, cigarettes, uncomplicated: Secondary | ICD-10-CM | POA: Diagnosis not present

## 2015-12-07 DIAGNOSIS — H53462 Homonymous bilateral field defects, left side: Secondary | ICD-10-CM

## 2015-12-07 DIAGNOSIS — I10 Essential (primary) hypertension: Secondary | ICD-10-CM | POA: Diagnosis not present

## 2015-12-07 DIAGNOSIS — I7101 Dissection of thoracic aorta: Secondary | ICD-10-CM | POA: Diagnosis not present

## 2015-12-07 DIAGNOSIS — Z8673 Personal history of transient ischemic attack (TIA), and cerebral infarction without residual deficits: Secondary | ICD-10-CM

## 2015-12-07 DIAGNOSIS — Z9889 Other specified postprocedural states: Secondary | ICD-10-CM | POA: Diagnosis not present

## 2015-12-07 MED FILL — cloNIDine HCL 0.2 MG TABS: 0.2 | 30 days supply | Qty: 60 | Fill #0

## 2015-12-07 MED FILL — oxyCODONE HCL 5 MG TABS: 5 | 20 days supply | Qty: 20 | Fill #0

## 2015-12-07 NOTE — Patient Instructions (Addendum)
Would recommend seeing an eye Dr in August   no driving   No return to work  Rec walking 10min 3 times a day

## 2015-12-07 NOTE — Telephone Encounter (Signed)
Letter written and ready for pick up Please inform patient  

## 2015-12-07 NOTE — Telephone Encounter (Signed)
Gave VO to [name] to continue home health orders Will work on letter

## 2015-12-07 NOTE — Telephone Encounter (Signed)
[name] requesting verbal orders to continue nurse visits. Please f/u

## 2015-12-07 NOTE — Progress Notes (Signed)
Subjective:    Patient ID: [name], male    DOB: 07/24/1961, 55 y.o.   MRN: [id number] 55 year old right-handed male with history of hypertension, tobacco polysubstance abuse with poor medical compliance as well as a known history of type B descending thoracic aortic dissection, diagnosed April, 2016. He lives with his mother independent prior to admission. Presented on October 25, 2015, with left hemianopsia as well as headache, some right shoulder discomfort. CT of the head showed hypodense cortical lesion with mild edema and mass effect in the medial right occipital lobe. CTA of head and neck showed extensive dissection of thoracic aorta. No flow demonstrated in the proximal right vertebral artery. No intracranial large or medium vessel occlusion demonstrated. Echocardiogram with ejection fraction of 65%, grade 1 diastolic dysfunction. Carotid Dopplers with no ICA stenosis. Urine drug screen positive for cocaine. The patient did not receive tPA. Cardiothoracic Surgery consulted, underwent repair of ascending aortic dissection per Dr. Donata ClayVan Trigt on October 28, 2015. HPI Left field cut still present, learning to compensate Some blurriness of vision  No longer using walker Has some exercises for hip pain Has completed PT, RIght hip pain better  Pain Inventory Average Pain 7 Pain Right Now 7 My pain is intermittent, sharp and stabbing  In the last 24 hours, has pain interfered with the following? General activity 8 Relation with others 8 Enjoyment of life 8 What TIME of day is your pain at its worst? morning Sleep (in general) Poor  Pain is worse with: some activites and coughing, sneezing, straining Pain improves with: medication Relief from Meds: 5  Mobility walk without assistance ability to climb steps?  yes do you drive?  no  Function not employed: date last employed .  Neuro/Psych bowel control problems weakness trouble  walking dizziness confusion anxiety  Prior Studies x-rays hospital f/u  Physicians involved in your care Primary care Dr. Armen PickupFunches hospital f/u   Family History  Problem Relation Age of Onset  . Diabetes Father   . Heart disease Father   . Dementia Father    Social History   Social History  . Marital Status: Divorced    Spouse Name: N/A  . Number of Children: N/A  . Years of Education: N/A   Social History Main Topics  . Smoking status: Current Every Day Smoker    Types: Cigarettes  . Smokeless tobacco: None  . Alcohol Use: No  . Drug Use: No     Comment: Patient denies marijuana and cociane use 01/19/15  . Sexual Activity: Yes   Other Topics Concern  . None   Social History Narrative   Past Surgical History  Procedure Laterality Date  . Rectal abscess    . Thoracic aortic aneurysm repair N/A 10/28/2015    Procedure: TYPE A AORTIC DISSECTION REPAIR WITH CIRC ARREST;  Surgeon: Kerin PernaPeter Van Trigt, MD;  [facility];  Service: Open Heart Surgery;  Laterality: N/A;  . Tee without cardioversion N/A 10/28/2015    Procedure: TRANSESOPHAGEAL ECHOCARDIOGRAM (TEE);  Surgeon: Kerin PernaPeter Van Trigt, MD;  [facility];  Service: Open Heart Surgery;  Laterality: N/A;   Past Medical History  Diagnosis Date  . Malignant hypertension Dx 2016  . Aortic dissection, thoracic (HCC)    BP 137/82 mmHg  Pulse 80  Resp 14  SpO2 98%  Opioid Risk Score:   Fall Risk Score:  `1  Depression screen [facility]  [facility] 11/25/2015 03/26/2015 03/19/2015 03/01/2015 02/17/2015 02/02/2015 02/02/2015  Decreased  Interest 1 0 0 0 Down, Depressed, Hopeless 2 0 0 0 PHQ - 2 Score 3 0 0 0 Altered sleeping 2 - - - 2 3 -  Tired, decreased energy 2 - - - 2 2 -  Change in appetite 2 - - - 3 2 -  Feeling bad or failure about yourself  1 - - - 2 2 -  Trouble concentrating 1 - - - 2 2 -  Moving slowly or fidgety/restless 1 - - - 2 2 -  Suicidal thoughts 0 - - - 2 1 -   PHQ-9 Score 12 - - - 19 18 -     Review of Systems  All other systems reviewed and are negative.      Objective:   Physical Exam  Constitutional: He is oriented to person, place, and time. He appears well-developed and well-nourished.  HENT:  Head: Normocephalic and atraumatic.  Eyes: Conjunctivae are normal. Pupils are equal, round, and reactive to light.  Left homonymous hemianopsia   Neurological: He is alert and oriented to person, place, and time.  Psychiatric: He has a normal mood and affect.  Nursing note and vitals reviewed.  Motor 5/5 in BUE  Neuro:  Eyes without evidence of nystagmus  Tone is normal without evidence of spasticity Cerebellar exam shows no evidence of ataxia on finger nose finger or heel to shin testing No evidence of trunkal ataxia  Motor strength is 5/5 in bilateral deltoid, biceps, triceps, finger flexors and extensors, wrist flexors and extensors, hip flexors, knee flexors and extensors, ankle dorsiflexors, plantar flexors, invertors and evertors, toe flexors and extensors  Sensory exam is normal to pinprick, proprioception and light touch in the upper and lower limbs   Cranial nerves II- Visual fields Left field cut , no blurring of vision III- no evidence of ptosis, upward, downward and medial gaze intact IV- no vertical diplopia or head tilt V- no facial numbness or masseter weakness VI- no pupil abduction weakness VII- no facial droop, good lid closure VII- normal auditory acuity IX- no pharygeal weakness,  X- no pharyngeal weakness, no hoarseness XI- no trap or SCM weakness XII- no glossal weakness         Assessment & Plan:  1.  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  Aortic dissection f/u with [facility]

## 2015-12-08 NOTE — Telephone Encounter (Signed)
Pt.notified

## 2015-12-08 NOTE — Telephone Encounter (Signed)
Patient picked up paperwork from doctor °

## 2015-12-10 ENCOUNTER — Telehealth: Payer: Self-pay | Admitting: *Deleted

## 2015-12-10 NOTE — Telephone Encounter (Signed)
erro  neous encounter

## 2015-12-16 ENCOUNTER — Ambulatory Visit: Payer: Self-pay | Admitting: Neurology

## 2015-12-16 ENCOUNTER — Other Ambulatory Visit: Payer: Self-pay | Admitting: *Deleted

## 2015-12-16 DIAGNOSIS — G8918 Other acute postprocedural pain: Secondary | ICD-10-CM

## 2015-12-16 MED ORDER — OXYCODONE HCL 5 MG PO TABS: 5.0000 mg | ORAL_TABLET | Freq: Two times a day (BID) | ORAL | Status: DC

## 2015-12-16 NOTE — Telephone Encounter (Signed)
Mr. [name] has called for a refill for his pain med s/p Repair of Aortic Dissection 10/28/15.  I discussed alternative meds such as Aleve, Ibuprofen with him and he is willing to try these. The continued use is causing a problem with constipation but he is using stool softeners and laxatives. I told him a new signed script would be available at our office today and he agreed.  H is making attempts to discontinue his narcotic use altogether.

## 2015-12-20 MED FILL — oxyCODONE HCL 5 MG TABS: 5 | 20 days supply | Qty: 40 | Fill #0

## 2015-12-21 ENCOUNTER — Ambulatory Visit: Payer: Medicaid Other | Attending: Family Medicine | Admitting: Family Medicine

## 2015-12-21 ENCOUNTER — Encounter: Payer: Self-pay | Admitting: Family Medicine

## 2015-12-21 VITALS — BP 143/83 | HR 80 | Temp 98.0°F | Resp 16 | Ht 68.0 in | Wt 237.0 lb

## 2015-12-21 DIAGNOSIS — F17210 Nicotine dependence, cigarettes, uncomplicated: Secondary | ICD-10-CM | POA: Diagnosis not present

## 2015-12-21 DIAGNOSIS — Z79899 Other long term (current) drug therapy: Secondary | ICD-10-CM | POA: Insufficient documentation

## 2015-12-21 DIAGNOSIS — Z1159 Encounter for screening for other viral diseases: Secondary | ICD-10-CM | POA: Diagnosis not present

## 2015-12-21 DIAGNOSIS — Z7982 Long term (current) use of aspirin: Secondary | ICD-10-CM | POA: Insufficient documentation

## 2015-12-21 DIAGNOSIS — F1410 Cocaine abuse, uncomplicated: Secondary | ICD-10-CM | POA: Insufficient documentation

## 2015-12-21 DIAGNOSIS — K5900 Constipation, unspecified: Secondary | ICD-10-CM | POA: Diagnosis present

## 2015-12-21 DIAGNOSIS — K5903 Drug induced constipation: Secondary | ICD-10-CM

## 2015-12-21 DIAGNOSIS — L918 Other hypertrophic disorders of the skin: Secondary | ICD-10-CM

## 2015-12-21 DIAGNOSIS — K089 Disorder of teeth and supporting structures, unspecified: Secondary | ICD-10-CM

## 2015-12-21 DIAGNOSIS — Z0000 Encounter for general adult medical examination without abnormal findings: Secondary | ICD-10-CM | POA: Diagnosis not present

## 2015-12-21 LAB — BASIC METABOLIC PANEL
BUN: 16 mg/dL (ref 7–25)
CHLORIDE: 103 mmol/L (ref 98–110)
CO2: 20 mmol/L (ref 20–31)
Calcium: 8.7 mg/dL (ref 8.6–10.3)
Creat: 0.85 mg/dL (ref 0.70–1.33)
GLUCOSE: 91 mg/dL (ref 65–99)
Potassium: 4.6 mmol/L (ref 3.5–5.3)
SODIUM: 136 mmol/L (ref 135–146)

## 2015-12-21 LAB — HEPATITIS C ANTIBODY: HCV Ab: NEGATIVE

## 2015-12-21 MED ORDER — POLYETHYLENE GLYCOL 3350 17 G PO PACK: 17.0000 g | PACK | Freq: Every day | ORAL | Status: AC | PRN

## 2015-12-21 NOTE — Progress Notes (Signed)
Subjective:  Patient ID: [name], male    DOB: 11/08/1960  Age: 55 y.o. MRN: [id number]  CC: Constipation   HPI [name] presents for    1. Dentist referral: he has poor dentition. Cocaine abuse hx. Has not seen a dentist in many years. No oral pain or swelling.   2. Constipation: x one week. Taking oxycodone. Taking miralax and mag citrate. Wants to know if mag citrate is safe. No N/V/D/blood in stool.   3. HM: due for hep C and screening c-scope.   4. Skin tags: would like removal of 2 large skin tags of R posterior thigh. Has irritation of tags. Tried vinegar home remedy.   Social History  Substance Use Topics  . Smoking status: Current Every Day Smoker    Types: Cigarettes  . Smokeless tobacco: Not on file  . Alcohol Use: No    Outpatient Prescriptions Prior to Visit  Medication Sig Dispense Refill  . aspirin EC 325 MG EC tablet Take 1 tablet (325 mg total) by mouth daily. 30 tablet 0  . cloNIDine (CATAPRES) 0.2 MG tablet Take 1 tablet (0.2 mg total) by mouth 2 (two) times daily. 60 tablet 1  . oxyCODONE (OXY IR/ROXICODONE) 5 MG immediate release tablet Take 1 tablet (5 mg total) by mouth every 12 (twelve) hours. 40 tablet 0  . polyethylene glycol (MIRALAX / GLYCOLAX) packet Take 17 g by mouth daily as needed. 14 each 0  . amiodarone (PACERONE) 200 MG tablet Take 1 tablet (200 mg total) by mouth 2 (two) times daily. (Patient not taking: Reported on 12/21/2015) 60 tablet 1  . docusate sodium (COLACE) 100 MG capsule Take 2 capsules (200 mg total) by mouth daily. (Patient not taking: Reported on 12/21/2015) 10 capsule 0  . pantoprazole (PROTONIX) 40 MG tablet Take 1 tablet (40 mg total) by mouth daily. (Patient not taking: Reported on 12/21/2015) 30 tablet 1  . potassium chloride SA (K-DUR,KLOR-CON) 20 MEQ tablet Take 1 tablet (20 mEq total) by mouth daily. (Patient not taking: Reported on 12/21/2015) 30 tablet 1   No facility-administered medications prior to visit.     ROS Review of Systems  Constitutional: Negative for fever, chills, fatigue and unexpected weight change.  Eyes: Negative for visual disturbance.  Respiratory: Negative for cough and shortness of breath.   Cardiovascular: Positive for chest pain. Negative for palpitations and leg swelling.  Gastrointestinal: Positive for constipation. Negative for nausea, vomiting, abdominal pain, diarrhea and blood in stool.  Endocrine: Negative for polydipsia, polyphagia and polyuria.  Musculoskeletal: Positive for myalgias. Negative for back pain, arthralgias, gait problem and neck pain.  Skin: Negative for rash.  Allergic/Immunologic: Negative for immunocompromised state.  Hematological: Negative for adenopathy. Does not bruise/bleed easily.  Psychiatric/Behavioral: Negative for suicidal ideas, sleep disturbance and dysphoric mood. The patient is not nervous/anxious.     Objective:  BP 143/83 mmHg  Pulse 80  Temp(Src) 98 F (36.7 C) (Oral)  Resp 16  Ht 5\' 8"  (1.727 m)  Wt 237 lb (107.502 kg)  BMI 36.04 kg/m2  SpO2 97%  BP/Weight 12/21/2015 12/07/2015 12/02/2015  Systolic BP 143 137 145  Diastolic BP 83 82 88  Wt. (Lbs) 237 - 232  BMI 36.04 - 35.28    Physical Exam  Constitutional: He appears well-developed and well-nourished. No distress.  HENT:  Head: Normocephalic and atraumatic.  Mouth/Throat: He does not have dentures. No oral lesions. No trismus in the jaw. Abnormal dentition. Dental caries present. No dental abscesses,  uvula swelling or lacerations.  Eyes: EOM are normal. Pupils are equal, round, and reactive to light.  Neck: Normal range of motion. Neck supple.  Cardiovascular: Normal rate, regular rhythm, normal heart sounds and intact distal pulses.   Pulmonary/Chest: Effort normal and breath sounds normal.    Abdominal: Soft. Bowel sounds are normal. He exhibits no distension and no mass. There is no tenderness. There is no rebound and no guarding.  Musculoskeletal: He  exhibits no edema.  Neurological: He is alert.  Skin: Skin is warm and dry. No rash noted. No erythema.     Psychiatric: He has a normal mood and affect.     Assessment & Plan:   There are no diagnoses linked to this encounter. [name] was seen today for constipation.  Diagnoses and all orders for this visit:  Healthcare maintenance -     Ambulatory referral to Gastroenterology  Constipation due to pain medication -     polyethylene glycol (MIRALAX / GLYCOLAX) packet; Take 17 g by mouth daily as needed. -     Basic Metabolic Panel  Need for hepatitis C screening test -     Hepatitis C antibody, reflex  Poor dentition -     Ambulatory referral to Dentistry   Meds ordered this encounter  Medications  . polyethylene glycol (MIRALAX / GLYCOLAX) packet    Sig: Take 17 g by mouth daily as needed.    Dispense:  30 each    Refill:  5    Follow-up: No Follow-up on file.   Dessa Phi MD

## 2015-12-21 NOTE — Progress Notes (Signed)
Dentist referral  C/C constipation x 1 week  Skin tag on rt leg  Pain scale # 0 No tobacco user since 10/25/2015 No suicidal thoughts in the past two weeks

## 2015-12-21 NOTE — Patient Instructions (Addendum)
[name] was seen today for constipation.  Diagnoses and all orders for this visit:  Healthcare maintenance -     Ambulatory referral to Gastroenterology  Constipation due to pain medication -     polyethylene glycol (MIRALAX / GLYCOLAX) packet; Take 17 g by mouth daily as needed. -     Basic Metabolic Panel  Need for hepatitis C screening test -     Hepatitis C antibody, reflex  Poor dentition -     Ambulatory referral to Dentistry   F/u in 2-3 weeks for skin tag removal x 2 on R posterior thigh and 1 small one on upper back, 30 minute visit   Dr. Armen PickupFunches

## 2015-12-28 ENCOUNTER — Encounter: Payer: Self-pay | Admitting: Gastroenterology

## 2015-12-29 ENCOUNTER — Telehealth: Payer: Self-pay | Admitting: *Deleted

## 2015-12-29 NOTE — Telephone Encounter (Signed)
Date of birth verified by pt  Hep C BMP normal  Pt verbalized understanding

## 2015-12-29 NOTE — Telephone Encounter (Signed)
-----   Message from Dessa PhiJosalyn Funches, MD sent at 12/22/2015 12:17 PM EDT ----- Screening hep C negative Normal BMP

## 2016-01-03 ENCOUNTER — Ambulatory Visit: Payer: Self-pay

## 2016-01-06 ENCOUNTER — Encounter: Payer: Self-pay | Admitting: Family Medicine

## 2016-01-06 ENCOUNTER — Ambulatory Visit: Payer: Medicaid Other | Attending: Family Medicine | Admitting: Family Medicine

## 2016-01-06 VITALS — BP 148/92 | HR 85 | Temp 98.2°F | Resp 16 | Ht 68.0 in | Wt 234.0 lb

## 2016-01-06 DIAGNOSIS — G4700 Insomnia, unspecified: Secondary | ICD-10-CM | POA: Insufficient documentation

## 2016-01-06 DIAGNOSIS — J302 Other seasonal allergic rhinitis: Secondary | ICD-10-CM | POA: Diagnosis not present

## 2016-01-06 DIAGNOSIS — Z7982 Long term (current) use of aspirin: Secondary | ICD-10-CM | POA: Insufficient documentation

## 2016-01-06 DIAGNOSIS — R0601 Orthopnea: Secondary | ICD-10-CM | POA: Diagnosis not present

## 2016-01-06 DIAGNOSIS — Z79899 Other long term (current) drug therapy: Secondary | ICD-10-CM | POA: Insufficient documentation

## 2016-01-06 DIAGNOSIS — F419 Anxiety disorder, unspecified: Secondary | ICD-10-CM | POA: Insufficient documentation

## 2016-01-06 MED ORDER — FLUTICASONE PROPIONATE 50 MCG/ACT NA SUSP: 2.0000 | Freq: Every day | NASAL | Status: DC

## 2016-01-06 MED ORDER — CETIRIZINE HCL 10 MG PO TABS: 10.0000 mg | ORAL_TABLET | Freq: Every day | ORAL | Status: AC

## 2016-01-06 MED ORDER — FUROSEMIDE 40 MG PO TABS: 40.0000 mg | ORAL_TABLET | Freq: Every day | ORAL | Status: DC

## 2016-01-06 MED ORDER — POTASSIUM CHLORIDE CRYS ER 20 MEQ PO TBCR: 20.0000 meq | EXTENDED_RELEASE_TABLET | Freq: Every day | ORAL | Status: DC

## 2016-01-06 MED ORDER — TRAZODONE HCL 50 MG PO TABS: 25.0000 mg | ORAL_TABLET | Freq: Every evening | ORAL | Status: DC | PRN

## 2016-01-06 MED FILL — traZODone HCL 50 MG TABS: 50 | 30 days supply | Qty: 30 | Fill #0

## 2016-01-06 MED FILL — FLUTICASONE PROP 50 MCG SPR: 50 | 30 days supply | Qty: 16 | Fill #0

## 2016-01-06 MED FILL — ?CETIRIZINE HCL 10 MG TABLE: 10 | 30 days supply | Qty: 30 | Fill #0 | Status: TO

## 2016-01-06 MED FILL — ?FUROSEMIDE 40 MG TABLET: 40 | 30 days supply | Qty: 30 | Fill #0

## 2016-01-06 NOTE — Assessment & Plan Note (Signed)
HEENT exam concerning for allergies  Plan  Zyrtec and flonase

## 2016-01-06 NOTE — Assessment & Plan Note (Signed)
Orthopnea without fluid overload on exam   Plan: Lasix and K+ while working up with pro BNP and CXR

## 2016-01-06 NOTE — Patient Instructions (Addendum)
[name] was seen today for anxiety.  Diagnoses and all orders for this visit:  Orthopnea -     Pro b natriuretic peptide -     DG Chest 2 View; Future -     furosemide (LASIX) 40 MG tablet; Take 1 tablet (40 mg total) by mouth daily. -     Basic Metabolic Panel -     CBC -     potassium chloride SA (K-DUR,KLOR-CON) 20 MEQ tablet; Take 1 tablet (20 mEq total) by mouth daily.  Seasonal allergies -     cetirizine (ZYRTEC) 10 MG tablet; Take 1 tablet (10 mg total) by mouth daily. -     fluticasone (FLONASE) 50 MCG/ACT nasal spray; Place 2 sprays into both nostrils daily.  Insomnia -     traZODone (DESYREL) 50 MG tablet; Take 0.5-1 tablets (25-50 mg total) by mouth at bedtime as needed for sleep.    Please decrease fluid intake to 3 bottles a day as there is some concern for fluid overload  You will be called with lab results Please obtain chest x-ray  F/u in 2 weeks for shortness of breath  Dr. Armen PickupFunches

## 2016-01-06 NOTE — Assessment & Plan Note (Signed)
Insomnia following cessation of ETOH use  Plan trazodone

## 2016-01-06 NOTE — Progress Notes (Signed)
Pt stated feeling anxiety SHOB wheezing x 1 day  Unable to sleep x 3 days  Nose congestion  No pain today  No suicidal thoughts in the past two weeks  Skin tag.

## 2016-01-06 NOTE — Progress Notes (Signed)
Subjective:  Patient ID: [name], male    DOB: 1960/09/21  Age: 55 y.o. MRN: [id number]  CC: Anxiety   HPI [name] presents for   1. SOB: at night. Was severe last night. Started after thoracic aortic aneurysm repair on 10/28/15. Feels pressure in neck and chest. Also has congestion. No cough, CP, swelling in legs. Denies cocaine use. Denies ETOH. Admits to anxiety and trouble sleeping since his repair.   Past Surgical History  Procedure Laterality Date  . Rectal abscess    . Thoracic aortic aneurysm repair N/A 10/28/2015    Procedure: TYPE A AORTIC DISSECTION REPAIR WITH CIRC ARREST;  Surgeon: Kerin Perna, MD;  Location: [facility] OR;  Service: Open Heart Surgery;  Laterality: N/A;  . Tee without cardioversion N/A 10/28/2015    Procedure: TRANSESOPHAGEAL ECHOCARDIOGRAM (TEE);  Surgeon: Kerin Perna, MD;  Location: [facility] OR;  Service: Open Heart Surgery;  Laterality: N/A;   Outpatient Prescriptions Prior to Visit  Medication Sig Dispense Refill  . aspirin EC 325 MG EC tablet Take 1 tablet (325 mg total) by mouth daily. 30 tablet 0  . cloNIDine (CATAPRES) 0.2 MG tablet Take 1 tablet (0.2 mg total) by mouth 2 (two) times daily. 60 tablet 1  . magnesium citrate SOLN Take 1 Bottle by mouth once.    . polyethylene glycol (MIRALAX / GLYCOLAX) packet Take 17 g by mouth daily as needed. 30 each 5  . oxyCODONE (OXY IR/ROXICODONE) 5 MG immediate release tablet Take 1 tablet (5 mg total) by mouth every 12 (twelve) hours. 40 tablet 0   No facility-administered medications prior to visit.    ROS Review of Systems  Constitutional: Negative for fever, chills, fatigue and unexpected weight change.  HENT: Positive for congestion.   Eyes: Negative for visual disturbance.  Respiratory: Positive for shortness of breath (when lying flat or lying back ). Negative for cough.   Cardiovascular: Negative for chest pain, palpitations and leg swelling.  Gastrointestinal: Negative for nausea,  vomiting, abdominal pain, diarrhea, constipation and blood in stool.  Endocrine: Negative for polydipsia, polyphagia and polyuria.  Musculoskeletal: Negative for myalgias, back pain, arthralgias, gait problem and neck pain.  Skin: Negative for rash.  Allergic/Immunologic: Negative for immunocompromised state.  Hematological: Negative for adenopathy. Does not bruise/bleed easily.  Psychiatric/Behavioral: Positive for sleep disturbance. Negative for suicidal ideas and dysphoric mood. The patient is not nervous/anxious.     Objective:  BP 148/92 mmHg  Pulse 85  Temp(Src) 98.2 F (36.8 C) (Oral)  Resp 16  Ht  (1.727 m)  Wt 234 lb (106.142 kg)  BMI 35.59 kg/m2  SpO2 98%  BP/Weight 01/06/2016 12/21/2015 12/07/2015  Systolic BP 148 143 137  Diastolic BP 92 83 82  Wt. (Lbs) 234 237 -  BMI 35.59 36.04 -    Physical Exam  Constitutional: He appears well-developed and well-nourished. No distress.  HENT:  Head: Normocephalic and atraumatic.  Nose: Mucosal edema (on R ) present.  Air fluid levels in both TM worse on R side   Neck: Normal range of motion. Neck supple.  Cardiovascular: Normal rate, regular rhythm, normal heart sounds and intact distal pulses.   No murmur heard. Pulmonary/Chest: Effort normal and breath sounds normal.  Musculoskeletal: He exhibits no edema.  Neurological: He is alert.  Skin: Skin is warm and dry. No rash noted. No erythema.  Psychiatric: He has a normal mood and affect.   Depression screen [facility] 2/9 01/06/2016 12/21/2015 11/25/2015 03/26/2015 03/19/2015  Decreased Interest 1 2 1  0 0  Down, Depressed, Hopeless 0 0 2 0 0  PHQ - 2 Score 1 2 3  0 0  Altered sleeping 3 2 2  - -  Tired, decreased energy 2 2 2  - -  Change in appetite 0 0 2 - -  Feeling bad or failure about yourself  0 0 1 - -  Trouble concentrating 0 0 1 - -  Moving slowly or fidgety/restless 0 0 1 - -  Suicidal thoughts 0 0 0 - -  PHQ-9 Score 6 6 12  - -  Difficult doing work/chores - Not  difficult at all - - -    GAD 7 : Generalized Anxiety Score 01/06/2016 12/21/2015 11/25/2015  Nervous, Anxious, on Edge 2 1 1   Control/stop worrying 1 1 1   Worry too much - different things 1 0 1  Trouble relaxing 2 2 1   Restless 2 1 0  Easily annoyed or irritable 1 1 0  Afraid - awful might happen 2 0 0  Total GAD 7 Score 11 6 4        Assessment & Plan:   There are no diagnoses linked to this encounter. [name] was seen today for anxiety.  Diagnoses and all orders for this visit:  Orthopnea -     Pro b natriuretic peptide -     DG Chest 2 View; Future -     furosemide (LASIX) 40 MG tablet; Take 1 tablet (40 mg total) by mouth daily. -     Basic Metabolic Panel -     CBC -     potassium chloride SA (K-DUR,KLOR-CON) 20 MEQ tablet; Take 1 tablet (20 mEq total) by mouth daily.  Seasonal allergies -     cetirizine (ZYRTEC) 10 MG tablet; Take 1 tablet (10 mg total) by mouth daily. -     fluticasone (FLONASE) 50 MCG/ACT nasal spray; Place 2 sprays into both nostrils daily.  Insomnia -     traZODone (DESYREL) 50 MG tablet; Take 0.5-1 tablets (25-50 mg total) by mouth at bedtime as needed for sleep.   No orders of the defined types were placed in this encounter.    Follow-up: No Follow-up on file.   Dessa PhiJosalyn Telena Peyser MD

## 2016-01-06 NOTE — Progress Notes (Deleted)
Skin Tag Removal Procedure Note    Pre-operative Diagnosis: Classic skin tags (acrochordon)    Post-operative Diagnosis: Classic skin tags (acrochordon)    Locations:{description:15099} {body part:32401}    Indications: ***    Anesthesia: {local:16397} {bicarb:16398}    Procedure Details   The risks (including bleeding and infection) and benefits of the procedure and {verbal:16408} informed consent obtained. Using sterile iris scissors, multiple skin tags were snipped off at their bases after cleansing with Betadine.  Bleeding was controlled by pressure.     Findings:  Pathognomonic benign lesions  not sent for pathological exam.    Condition:  Stable    Complications:  {complications:13423}.    Plan:  1. Instructed to keep the wounds dry and covered for 24-48h and clean thereafter.  2. Warning signs of infection were reviewed.    3. Recommended that the patient use {pain meds:16413} as needed for pain.   4. Return as needed.

## 2016-01-07 ENCOUNTER — Observation Stay
Admission: EM | Admit: 2016-01-07 | Discharge: 2016-01-10 | Disposition: A | Discharge status: Home / Self Care | Payer: Medicaid Other | Attending: Cardiology | Admitting: Cardiology

## 2016-01-07 ENCOUNTER — Emergency Department (HOSPITAL_COMMUNITY): Payer: Medicaid Other

## 2016-01-07 ENCOUNTER — Encounter: Payer: Self-pay | Admitting: Clinical

## 2016-01-07 ENCOUNTER — Other Ambulatory Visit: Payer: Self-pay

## 2016-01-07 ENCOUNTER — Telehealth: Payer: Self-pay | Admitting: Family Medicine

## 2016-01-07 ENCOUNTER — Encounter (HOSPITAL_COMMUNITY): Payer: Self-pay | Admitting: Emergency Medicine

## 2016-01-07 ENCOUNTER — Ambulatory Visit (HOSPITAL_BASED_OUTPATIENT_CLINIC_OR_DEPARTMENT_OTHER): Payer: Medicaid Other | Admitting: Family Medicine

## 2016-01-07 ENCOUNTER — Encounter: Payer: Self-pay | Admitting: Family Medicine

## 2016-01-07 VITALS — BP 132/87 | HR 82 | Temp 98.0°F | Resp 16 | Ht 68.0 in | Wt 232.0 lb

## 2016-01-07 DIAGNOSIS — R079 Chest pain, unspecified: Secondary | ICD-10-CM | POA: Insufficient documentation

## 2016-01-07 DIAGNOSIS — Z7982 Long term (current) use of aspirin: Secondary | ICD-10-CM | POA: Insufficient documentation

## 2016-01-07 DIAGNOSIS — N200 Calculus of kidney: Secondary | ICD-10-CM | POA: Insufficient documentation

## 2016-01-07 DIAGNOSIS — I255 Ischemic cardiomyopathy: Secondary | ICD-10-CM | POA: Diagnosis not present

## 2016-01-07 DIAGNOSIS — F17210 Nicotine dependence, cigarettes, uncomplicated: Secondary | ICD-10-CM | POA: Diagnosis not present

## 2016-01-07 DIAGNOSIS — R9431 Abnormal electrocardiogram [ECG] [EKG]: Secondary | ICD-10-CM | POA: Diagnosis not present

## 2016-01-07 DIAGNOSIS — I10 Essential (primary) hypertension: Secondary | ICD-10-CM | POA: Diagnosis not present

## 2016-01-07 DIAGNOSIS — Z8673 Personal history of transient ischemic attack (TIA), and cerebral infarction without residual deficits: Secondary | ICD-10-CM | POA: Diagnosis not present

## 2016-01-07 DIAGNOSIS — I509 Heart failure, unspecified: Secondary | ICD-10-CM

## 2016-01-07 DIAGNOSIS — I252 Old myocardial infarction: Secondary | ICD-10-CM | POA: Diagnosis not present

## 2016-01-07 DIAGNOSIS — R0600 Dyspnea, unspecified: Secondary | ICD-10-CM | POA: Diagnosis present

## 2016-01-07 DIAGNOSIS — R799 Abnormal finding of blood chemistry, unspecified: Secondary | ICD-10-CM

## 2016-01-07 DIAGNOSIS — R0601 Orthopnea: Secondary | ICD-10-CM

## 2016-01-07 DIAGNOSIS — F1410 Cocaine abuse, uncomplicated: Secondary | ICD-10-CM | POA: Diagnosis not present

## 2016-01-07 DIAGNOSIS — I4891 Unspecified atrial fibrillation: Secondary | ICD-10-CM | POA: Diagnosis not present

## 2016-01-07 DIAGNOSIS — Z9114 Patient's other noncompliance with medication regimen: Secondary | ICD-10-CM | POA: Insufficient documentation

## 2016-01-07 DIAGNOSIS — I7101 Dissection of thoracic aorta: Secondary | ICD-10-CM | POA: Insufficient documentation

## 2016-01-07 DIAGNOSIS — R7989 Other specified abnormal findings of blood chemistry: Secondary | ICD-10-CM

## 2016-01-07 DIAGNOSIS — I7103 Dissection of thoracoabdominal aorta: Secondary | ICD-10-CM | POA: Diagnosis present

## 2016-01-07 DIAGNOSIS — I71019 Dissection of thoracic aorta, unspecified: Secondary | ICD-10-CM | POA: Diagnosis present

## 2016-01-07 DIAGNOSIS — I7100 Dissection of unspecified site of aorta: Secondary | ICD-10-CM

## 2016-01-07 HISTORY — DX: Cerebral infarction, unspecified: I63.9

## 2016-01-07 LAB — CBC
HCT: 44.4 % (ref 38.5–50.0)
HCT: 44.1 % (ref 39.0–52.0)
HEMATOCRIT: 43 % (ref 39.0–52.0)
HEMOGLOBIN: 13.9 g/dL (ref 13.2–17.1)
Hemoglobin: 13.5 g/dL (ref 13.0–17.0)
Hemoglobin: 13.4 g/dL (ref 13.0–17.0)
MCH: 24.2 pg — AB (ref 27.0–33.0)
MCH: 23.9 pg — ABNORMAL LOW (ref 26.0–34.0)
MCH: 24.2 pg — ABNORMAL LOW (ref 26.0–34.0)
MCHC: 31.3 g/dL — ABNORMAL LOW (ref 32.0–36.0)
MCHC: 30.6 g/dL (ref 30.0–36.0)
MCHC: 31.2 g/dL (ref 30.0–36.0)
MCV: 77.2 fL — ABNORMAL LOW (ref 80.0–100.0)
MCV: 78.2 fL (ref 78.0–100.0)
MCV: 77.8 fL — ABNORMAL LOW (ref 78.0–100.0)
MPV: 9 fL (ref 7.5–12.5)
PLATELETS: 308 10*3/uL (ref 150–400)
Platelets: 324 10*3/uL (ref 140–400)
Platelets: 307 10*3/uL (ref 150–400)
RBC: 5.75 MIL/uL (ref 4.20–5.80)
RBC: 5.64 MIL/uL (ref 4.22–5.81)
RBC: 5.53 MIL/uL (ref 4.22–5.81)
RDW: 15.4 % — ABNORMAL HIGH (ref 11.0–15.0)
RDW: 15.3 % (ref 11.5–15.5)
RDW: 15.4 % (ref 11.5–15.5)
WBC: 7.6 10*3/uL (ref 3.8–10.8)
WBC: 6.3 10*3/uL (ref 4.0–10.5)
WBC: 6.4 10*3/uL (ref 4.0–10.5)

## 2016-01-07 LAB — HEPATIC FUNCTION PANEL
ALK PHOS: 91 U/L (ref 38–126)
ALT: 14 U/L — AB (ref 17–63)
AST: 16 U/L (ref 15–41)
Albumin: 3.5 g/dL (ref 3.5–5.0)
BILIRUBIN TOTAL: 0.4 mg/dL (ref 0.3–1.2)
Bilirubin, Direct: <0.1 mg/dL — ABNORMAL LOW (ref 0.1–0.5)
TOTAL PROTEIN: 7.6 g/dL (ref 6.5–8.1)

## 2016-01-07 LAB — BASIC METABOLIC PANEL
Anion gap: 9 (ref 5–15)
BUN: 22 mg/dL (ref 7–25)
BUN: 23 mg/dL — AB (ref 6–20)
CALCIUM: 9.1 mg/dL (ref 8.6–10.3)
CALCIUM: 9.2 mg/dL (ref 8.9–10.3)
CHLORIDE: 107 mmol/L (ref 101–111)
CO2: 21 mmol/L (ref 20–31)
CO2: 23 mmol/L (ref 22–32)
CREATININE: 1.02 mg/dL (ref 0.61–1.24)
Chloride: 105 mmol/L (ref 98–110)
Creat: 1.13 mg/dL (ref 0.70–1.33)
GFR calc Af Amer: >60 mL/min (ref >60)
Glucose, Bld: 79 mg/dL (ref 65–99)
Glucose, Bld: 97 mg/dL (ref 65–99)
Potassium: 4.9 mmol/L (ref 3.5–5.3)
Potassium: 4.7 mmol/L (ref 3.5–5.1)
SODIUM: 140 mmol/L (ref 135–146)
SODIUM: 139 mmol/L (ref 135–145)

## 2016-01-07 LAB — PRO B NATRIURETIC PEPTIDE: PRO B NATRI PEPTIDE: 1102 pg/mL — AB (ref <126)

## 2016-01-07 LAB — I-STAT TROPONIN, ED: Troponin i, poc: 0.02 ng/mL (ref 0.00–0.08)

## 2016-01-07 LAB — CREATININE, SERUM: Creatinine, Ser: 1.13 mg/dL (ref 0.61–1.24)

## 2016-01-07 LAB — TROPONIN I
Troponin I: 0.03 ng/mL (ref <0.031)
Troponin I: 0.03 ng/mL (ref <0.031)

## 2016-01-07 LAB — TSH: TSH: 3.152 u[IU]/mL (ref 0.350–4.500)

## 2016-01-07 LAB — BRAIN NATRIURETIC PEPTIDE: B NATRIURETIC PEPTIDE 5: 122.7 pg/mL — AB (ref 0.0–100.0)

## 2016-01-07 MED ORDER — IOPAMIDOL (ISOVUE-370) INJECTION 76%
INTRAVENOUS | Status: AC
  Administered 2016-01-07: 100 mL
  Filled 2016-01-07: qty 100

## 2016-01-07 MED ORDER — FLUTICASONE PROPIONATE 50 MCG/ACT NA SUSP
2.0000 | Freq: Every day | NASAL | Status: DC
  Administered 2016-01-08 – 2016-01-10 (×3): 2 via NASAL
  Filled 2016-01-07: qty 16

## 2016-01-07 MED ORDER — LORATADINE 10 MG PO TABS
10.0000 mg | ORAL_TABLET | Freq: Every day | ORAL | Status: DC
Start: 2016-01-08 — End: 2016-01-10
  Administered 2016-01-08 – 2016-01-10 (×3): 10 mg via ORAL
  Filled 2016-01-07 (×3): qty 1

## 2016-01-07 MED ORDER — ASPIRIN EC 81 MG PO TBEC
81.0000 mg | DELAYED_RELEASE_TABLET | Freq: Every day | ORAL | Status: DC
  Administered 2016-01-08 – 2016-01-10 (×3): 81 mg via ORAL
  Filled 2016-01-07 (×3): qty 1

## 2016-01-07 MED ORDER — ASPIRIN 81 MG PO CHEW
324.0000 mg | CHEWABLE_TABLET | ORAL | Status: AC
  Administered 2016-01-07: 324 mg via ORAL
  Filled 2016-01-07: qty 4

## 2016-01-07 MED ORDER — ACETAMINOPHEN 325 MG PO TABS
650.0000 mg | ORAL_TABLET | ORAL | Status: DC | PRN
  Administered 2016-01-08: 650 mg via ORAL
  Filled 2016-01-07: qty 2

## 2016-01-07 MED ORDER — ONDANSETRON HCL 4 MG/2ML IJ SOLN: 4.0000 mg | Freq: Four times a day (QID) | INTRAMUSCULAR | Status: DC | PRN

## 2016-01-07 MED ORDER — ASPIRIN 300 MG RE SUPP
300.0000 mg | RECTAL | Status: AC
  Filled 2016-01-07: qty 1

## 2016-01-07 MED ORDER — HEPARIN SODIUM (PORCINE) 5000 UNIT/ML IJ SOLN
5000.0000 [IU] | Freq: Three times a day (TID) | INTRAMUSCULAR | Status: DC
  Administered 2016-01-07 – 2016-01-10 (×8): 5000 [IU] via SUBCUTANEOUS
  Filled 2016-01-07 (×6): qty 1

## 2016-01-07 MED ORDER — NITROGLYCERIN 0.4 MG SL SUBL: 0.4000 mg | SUBLINGUAL_TABLET | SUBLINGUAL | Status: DC | PRN

## 2016-01-07 MED ORDER — TRAZODONE HCL 50 MG PO TABS: 25.0000 mg | ORAL_TABLET | Freq: Every evening | ORAL | Status: DC | PRN

## 2016-01-07 MED ORDER — CLONIDINE HCL 0.2 MG PO TABS
0.2000 mg | ORAL_TABLET | Freq: Two times a day (BID) | ORAL | Status: DC
  Administered 2016-01-07 – 2016-01-10 (×6): 0.2 mg via ORAL
  Filled 2016-01-07 (×6): qty 1

## 2016-01-07 MED ORDER — ATORVASTATIN CALCIUM 40 MG PO TABS
40.0000 mg | ORAL_TABLET | Freq: Every day | ORAL | Status: DC
  Administered 2016-01-08 – 2016-01-09 (×2): 40 mg via ORAL
  Filled 2016-01-07 (×2): qty 1

## 2016-01-07 MED ORDER — FUROSEMIDE 40 MG PO TABS
40.0000 mg | ORAL_TABLET | Freq: Every day | ORAL | Status: DC
  Administered 2016-01-07 – 2016-01-10 (×4): 40 mg via ORAL
  Filled 2016-01-07 (×4): qty 1

## 2016-01-07 MED ORDER — LOSARTAN POTASSIUM 50 MG PO TABS
50.0000 mg | ORAL_TABLET | Freq: Every day | ORAL | Status: DC
  Administered 2016-01-08 – 2016-01-10 (×3): 50 mg via ORAL
  Filled 2016-01-07 (×4): qty 1

## 2016-01-07 MED FILL — POTASSIUM CL ER 20 MEQ TAB: 20 | 30 days supply | Qty: 30 | Fill #0

## 2016-01-07 NOTE — Progress Notes (Signed)
Subjective:  Patient ID: [name], male    DOB: [birth year]  Age: 55 y.o. MRN: [id number]  CC: Shortness of Breath   HPI [name] has HTN, hx of cocaine and ETOH abuse, thoracic aortic dissection rupture s/p repair two months ago.    1. SOB: x 2 months. Persistent. At night when lying flat. Relieved with sitting up. Associated with anxious feels and trouble sleeping. No leg swelling. No CP. No cocaine or ETOH since last hospitalization. Admitted to smoking a few times. Was seen by me in the office yesterday. ProBNP obtained and 1100. CXR ordered, not done yet. Pharmacy was closed so he did not get lasix, trazodone, potassium, zyrtec. Here today at my request for EKG.   Past Surgical History  Procedure Laterality Date  . Rectal abscess    . Thoracic aortic aneurysm repair N/A 10/28/2015    Procedure: TYPE A AORTIC DISSECTION REPAIR WITH CIRC ARREST;  Surgeon: Kerin Perna, MD;  Location: [facility] OR;  Service: Open Heart Surgery;  Laterality: N/A;  . Tee without cardioversion N/A 10/28/2015    Procedure: TRANSESOPHAGEAL ECHOCARDIOGRAM (TEE);  Surgeon: Kerin Perna, MD;  Location: [facility] OR;  Service: Open Heart Surgery;  Laterality: N/A;     Outpatient Prescriptions Prior to Visit  Medication Sig Dispense Refill  . aspirin EC 325 MG EC tablet Take 1 tablet (325 mg total) by mouth daily. 30 tablet 0  . cetirizine (ZYRTEC) 10 MG tablet Take 1 tablet (10 mg total) by mouth daily. 30 tablet 11  . cloNIDine (CATAPRES) 0.2 MG tablet Take 1 tablet (0.2 mg total) by mouth 2 (two) times daily. 60 tablet 1  . fluticasone (FLONASE) 50 MCG/ACT nasal spray Place 2 sprays into both nostrils daily. 16 g 6  . furosemide (LASIX) 40 MG tablet Take 1 tablet (40 mg total) by mouth daily. 30 tablet 3  . magnesium citrate SOLN Take 1 Bottle by mouth once.    . polyethylene glycol (MIRALAX / GLYCOLAX) packet Take 17 g by mouth daily as needed. 30 each 5  . potassium chloride SA (K-DUR,KLOR-CON) 20 MEQ  tablet Take 1 tablet (20 mEq total) by mouth daily. 30 tablet 0  . traZODone (DESYREL) 50 MG tablet Take 0.5-1 tablets (25-50 mg total) by mouth at bedtime as needed for sleep. 30 tablet 3   No facility-administered medications prior to visit.    ROS Review of Systems  Constitutional: Negative for fever, chills, fatigue and unexpected weight change.  HENT: Positive for congestion.   Eyes: Negative for visual disturbance.  Respiratory: Positive for shortness of breath (when lying flat or lying back ). Negative for cough.   Cardiovascular: Negative for chest pain, palpitations and leg swelling.  Gastrointestinal: Negative for nausea, vomiting, abdominal pain, diarrhea, constipation and blood in stool.  Endocrine: Negative for polydipsia, polyphagia and polyuria.  Musculoskeletal: Negative for myalgias, back pain, arthralgias, gait problem and neck pain.  Skin: Negative for rash.  Allergic/Immunologic: Negative for immunocompromised state.  Hematological: Negative for adenopathy. Does not bruise/bleed easily.  Psychiatric/Behavioral: Positive for sleep disturbance. Negative for suicidal ideas and dysphoric mood. The patient is not nervous/anxious.     Objective:  BP 132/87 mmHg  Pulse 82  Temp(Src) 98 F (36.7 C) (Oral)  Resp 16  Ht  (1.727 m)  Wt 232 lb (105.235 kg)  BMI 35.28 kg/m2  SpO2 97%  BP/Weight 01/07/2016 01/06/2016 12/21/2015  Systolic BP 132 148 143  Diastolic BP 87 92 83  Wt. (Lbs) 232 234 237  BMI 35.28 35.59 36.04   Physical Exam  Constitutional: He appears well-developed and well-nourished. No distress.  HENT:  Head: Normocephalic and atraumatic.  Nose: Mucosal edema (on R ) present.  Air fluid levels in both TM worse on R side   Neck: Normal range of motion. Neck supple.  Cardiovascular: Normal rate, regular rhythm, normal heart sounds and intact distal pulses.   No murmur heard. Pulmonary/Chest: Effort normal and breath sounds normal.    Musculoskeletal: He exhibits no edema.  Neurological: He is alert.  Skin: Skin is warm and dry. No rash noted. No erythema.  Psychiatric: He has a normal mood and affect.   BNP No results found for: BNP  ProBNP    Component Value Date/Time   PROBNP 1102.00* 01/06/2016 1753    EKG: normal sinus rhythm. T wave inversions in inferior and lateral leads   Assessment & Plan:   EKG is abnormal. He no longer has the ST elevation in the inferior leads he had during his hospitalization, but he does have T wave inversions in the inferior and lateral leads that are new.   I called over to speak with his cardiothoracic surgeon, Dr. Morton PetersVan Tright, as patient does not currently have a cardiologist. Dr. Morton PetersVan Tright was not available as he was in the cath lab. I spoke to one of the PA's who advised ED evaluation since patient does not have a cardiologist who knows him well and can see him in the office. I agreed as I am concerned for recent onset cardiac  ischemia vs infarction and patient is high risk given his history.   I  discussed plan with patient who agreed to ED evaluation. [facility] and [facility] by private vehicle. His mother drove him to the visit today and was waiting in the waiting room.   There are no diagnoses linked to this encounter.  No orders of the defined types were placed in this encounter.    Follow-up: No Follow-up on file.   Dessa PhiJosalyn Jazzmine Kleiman MD

## 2016-01-07 NOTE — Consult Note (Deleted)
H & P    Patient ID: [name] MRN: [id number], DOB/AGE: Jan 26, 1961   Admit date: 01/07/2016 Date of Consult: 01/07/2016  Primary Physician: Lora Paula, MD Primary Cardiologist: New  Requesting Provider:   Patient Profile    55 yo male with PMH of malignant HTN/CVA/s/p Thoracic aortic dissection repair 10/28/2015 by Dr. Donata Clay presented to the [facility] ED with orthopnea, PND and fatigue for the past 3 days.  Past Medical History   Past Medical History  Diagnosis Date  . Malignant hypertension Dx 2016  . Aortic dissection, thoracic (HCC) 10/28/2015  . CVA (cerebral infarction)     Past Surgical History  Procedure Laterality Date  . Rectal abscess    . Thoracic aortic aneurysm repair N/A 10/28/2015    Procedure: TYPE A AORTIC DISSECTION REPAIR WITH CIRC ARREST;  Surgeon: Kerin Perna, MD;  Location: [facility] OR;  Service: Open Heart Surgery;  Laterality: N/A;  . Tee without cardioversion N/A 10/28/2015    Procedure: TRANSESOPHAGEAL ECHOCARDIOGRAM (TEE);  Surgeon: Kerin Perna, MD;  Location: [facility] OR;  Service: Open Heart Surgery;  Laterality: N/A;     Allergies  No Known Allergies  History of Present Illness    Mr. [name] is a 55 yo male with PMH of malignant HTN/CVA/s/p Thoracic aortic dissection repair 10/28/2015 by Dr. Donata Clay. He denies having seen a cardiologist in the past, but there is one visit with Dr. Daleen Squibb noted in the chart from 2016. Most recently he was admitted in February of this year when he presented to the ER with acute onset of left visual field loss with a sequential diagnosis of right occipital CVA and occlusion of right vertebral artery. At that time he reported a history of cocaine abuse, using at least 2-3 days during the week. Also reported poor compliance with all medications and noted to be hypertensive with significantly different blood pressure recordings in his left and right arm. CTA at that time showed a new tear in the ascending aorta  with the tear obstructing the flow to the right carotid and right subclavian. During this admission he underwent a repair of type A thoracic aortic dissection via Dr. Donata Clay. His echocardiogram prior to surgery showed EF of 60-65%. During surgery was noted to have acute elevation of ST segment on EKG,  which was felt 7 secondary to air or possibly coronary disease. Had transient a-fib after repair and placed on amiodarone. He was discharged home on amiodarone 200 mg along with clonidine 0.2 mg, and his losartan 100 mg tablet along with metoprolol tartrate 25 mg tablet was stopped. He was most recently seen by Dr. Donata Clay on 12/02/2015 was noted that he had good initial recovery after repair and it was stressed to him at this time the importance of good blood pressure control to abstain from smoking cocaine use. He was scheduled for follow-up in 6-8 weeks her blood pressure recheck.   Most recently he reports a 2 day history of orthopnea and post nocturnal dyspnea, which he was recently seen for in the office yesterday by Dr. Armen Pickup. He was started back on Lasix 40 mg daily and chest x-ray was ordered. Patient reports he did not fill his Lasix prescription and was called back this morning by Dr. Armen Pickup. He was seen again in the office this morning and noted to have possible abnormal EKG. He was sent to the ER for emergent evaluation of elevated pro BNP, and reported EKG changes.  In the ER his labs showed normal electrolyte panel, BNP 122.7, hemoglobin 13.5, and stable renal function with a creatinine of 1.02. He had a CTA which was negative, along with a chest x-ray which is normal. EKG remains unchanged SR Rate-84 with left atrial enlargement, inverted T waves and diffuse ST changes. He denies any chest pain or pressure, dizziness, palpitations, or lightheadedness. Noted to be slightly hypertensive while in the ER, heart rate in the 80s.   Cardiology has been called in reference to the abnormal  EKG.  Inpatient Medications      Family History    Family History  Problem Relation Age of Onset  . Diabetes Father   . Heart disease Father   . Dementia Father     Social History    Social History   Social History  . Marital Status: Divorced    Spouse Name: N/A  . Number of Children: N/A  . Years of Education: N/A   Occupational History  . Not on file.   Social History Main Topics  . Smoking status: Current Every Day Smoker    Types: Cigarettes  . Smokeless tobacco: Not on file  . Alcohol Use: No  . Drug Use: No     Comment: Patient denies marijuana and cociane use 01/19/15  . Sexual Activity: Yes   Other Topics Concern  . Not on file   Social History Narrative     Review of Systems    General:  No chills, fever, night sweats or weight changes.  Cardiovascular:  No chest pain, dyspnea on exertion, edema, + orthopnea, palpitations, + paroxysmal nocturnal dyspnea. Dermatological: No rash, lesions/masses Respiratory: No cough, + dyspnea Urologic: No hematuria, dysuria Abdominal:   No nausea, vomiting, diarrhea, bright red blood per rectum, melena, or hematemesis Neurologic:  No visual changes, wkns, changes in mental status. All other systems reviewed and are otherwise negative except as noted above.  Physical Exam    Blood pressure 134/90, pulse 76, temperature 98 F (36.7 C), temperature source Oral, resp. rate 21, height  (1.727 m), weight 234 lb (106.142 kg), SpO2 97 %.  General: Pleasant male, NAD Psych: Normal affect. Neuro: Alert and oriented X 3. Moves all extremities spontaneously. HEENT: Normal  Neck: Supple without bruits or JVD. Lungs:  Resp regular and unlabored, CTA. Heart: RRR no s3, s4, or murmurs. Abdomen: Soft, non-tender, non-distended, BS + x 4.  Extremities: No clubbing, cyanosis or edema. DP/PT/Radials 2+ and equal bilaterally.  Labs    Troponin (Point of Care Test)  Recent Labs  01/07/16 1201  TROPIPOC 0.02   No  results for input(s): CKTOTAL, CKMB, TROPONINI in the last 72 hours. Lab Results  Component Value Date   WBC 6.3 01/07/2016   HGB 13.5 01/07/2016   HCT 44.1 01/07/2016   MCV 78.2 01/07/2016   PLT 307 01/07/2016     Recent Labs Lab 01/07/16 1204  NA 139  K 4.7  CL 107  CO2 23  BUN 23*  CREATININE 1.02  CALCIUM 9.2  GLUCOSE 97   Lab Results  Component Value Date   CHOL 120 10/27/2015   HDL 35* 10/27/2015   LDLCALC 73 10/27/2015   TRIG 61 10/27/2015   Lab Results  Component Value Date   DDIMER 7.70* 10/26/2015     Radiology Studies    Dg Chest 2 View  01/07/2016  CLINICAL DATA:  Shortness of breath. Chest pain. History of aortic dissection. EXAM: CHEST  2 VIEW COMPARISON:  Chest  x-ray 12/02/2015.  Chest CT 10/25/2015. FINDINGS: Lingular atelectasis in the lingula. Right lung is clear. No effusions. Heart is normal size. Prior median sternotomy. No acute bony abnormality. IMPRESSION: Lingular atelectasis.  No active disease. Electronically Signed   By: Charlett NoseKevin  Dover M.D.   On: 01/07/2016 12:26   Ct Angio Chest/abd/pel For Dissection W And/or W/wo  01/07/2016  CLINICAL DATA:  Chest pain and shortness of Breath, history of aortic dissection repair. EXAM: CT ANGIOGRAPHY CHEST, ABDOMEN AND PELVIS TECHNIQUE: Multidetector CT imaging through the chest, abdomen and pelvis was performed using the standard protocol during bolus administration of intravenous contrast. Multiplanar reconstructed images and MIPs were obtained and reviewed to evaluate the vascular anatomy. CONTRAST:  100 mL Isovue 370. COMPARISON:  10/25/2015 FINDINGS: CTA CHEST FINDINGS The lungs are well aerated bilaterally. Some mild periaortic atelectasis is noted in the left lower lobe. This is stable from the prior exam. No new focal infiltrate or effusion is seen. The thoracic inlet shows evidence of dissection flaps in the proximal portions of the left subclavian artery as well as within the proximal portion of the right  subclavian and right common carotid artery. The 5 seen previously in the origin of the right innominate artery is not as well appreciated following the interval surgery. There are changes consistent with ascending aortic repair without recurrent dissection. There remains an intimal flap in the distal aspect of the thoracic aortic arch with origin of the left vertebral and left subclavian artery from the residual false lumen. This false lumen extends only for few cm proximally. Second false lumen is noted in the descending thoracic aorta. This extends to the level of the aortic hiatus and into the abdominal aorta similar to that seen on the prior exam. No hilar or mediastinal adenopathy is noted. Changes of prior median sternotomy are noted. No acute bony abnormality is seen. Pulmonary artery is within normal limits. Review of the MIP images confirms the above findings. CTA ABDOMEN AND PELVIS FINDINGS The dissection flap extends inferiorly in the abdominal aorta and subsequently into the left common iliac artery. It does not extend inferiorly into the internal or external iliac arteries. There is a focal defect in the flap just above the origin of the left renal artery best seen on image number 110 of series 5. This allows perfusion of the false lumen. The celiac axis, superior mesenteric artery and dual right renal arteries arise from the true lumen. The left renal artery and inferior mesenteric artery arise from the false lumen. Adequate perfusion of the iliac arteries is noted bilaterally. The liver, spleen, adrenal glands, pancreas and gallbladder are normal in their CT appearance. Kidneys demonstrate a normal enhancement pattern bilaterally. Staghorn calculus is noted in the lower pole of the right kidney measuring approximately 2.5 cm in greatest dimension. This was not as well appreciated on the prior exam due to the timing of the contrast bolus. The appendix is within normal limits. No significant  diverticulitis is seen. The bladder is partially distended. No pelvic mass lesion is noted. The osseous structures show degenerative change of the lumbar spine and right hip joint. Review of the MIP images confirms the above findings. IMPRESSION: CTA of the chest: Status post repair of aortic dissection with widely patent ascending aortic graft. Dissection flaps are again identified within the proximal portions of the right common carotid artery, right subclavian artery and proximal left subclavian artery. Persistent small false lumen at the distal aspect of the aortic arch with perfusion of  the left vertebral artery and left subclavian artery from this outpouching. Second area of dissection in the descending thoracic aorta similar to that seen on the prior exam. It perfuse is via an intimal flap just above the left renal artery. Stable left basilar atelectasis CTA of the abdomen and pelvis: Continued dissection of the abdominal aorta with extension into the left common iliac artery. The overall appearance is similar to that seen on the prior CT examination. Per fusion of the visceral vessels is as described above with only the inferior mesenteric artery and left renal artery being perfuse by the false lumen. Right staghorn calculus. No other focal abnormality is seen. Electronically Signed   By: Alcide Clever M.D.   On: 01/07/2016 15:11    ECG & Cardiac Imaging    EKG: 01/07/2016:  EKG remains unchanged SR Rate-84 with left atrial enlargement, inverted T waves and diffuse ST changes.  Echo 10/26/2015:   Study Conclusions  - Left ventricle: The cavity size was normal. There was moderate  concentric hypertrophy. Systolic function was normal. The  estimated ejection fraction was in the range of 60% to 65%. Wall  motion was normal; there were no regional wall motion  abnormalities. There was an increased relative contribution of  atrial contraction to ventricular filling. Doppler parameters are   consistent with abnormal left ventricular relaxation (grade 1  diastolic dysfunction). - Aortic valve: Trileaflet; mildly thickened, mildly calcified  leaflets. There was trivial regurgitation. - Aorta: Aortic root dimension: 40 mm (ED). Ascending aorta  diameter: 50 mm (ED). - Mitral valve: There was trivial regurgitation.  Assessment & Plan    55 yo male with PMH of malignant HTN/CVA/s/p Thoracic aortic dissection repair 10/28/2015 by Dr. Donata Clay presented to the [facility] ED with orthopnea, PND and fatigue for the past 3 days.  1. Dyspnea: Patient reports three-day history of orthopnea, post nocturnal dyspnea which she was recently seen in PCP office for an prescribed 40 mg of Lasix daily. Unfortunately patient did not fill this prescription and was seen back in PCP office this morning for an elevated proBNP and possible abnormal EKG changes. On further evaluation EKG changes were noted not to acute. PCO trop neg, along with one Lab trop that was negative. BNP was 122.7. Patient denied any chest pain/pressure palpitations, dizziness,or lightheadedness. Given his history of thoracic aortic dissection repair, along with malignant hypertension and noncompliance in the past with blood pressure medications, I believe he would benefit from repeat echo along with stress test.  -He is not fluid overloaded on exam, chest x-ray was clear, and BMP was mildly elevated. Will order previous Lasix dose of  IV daily. Monitor I&O. -Admit to telemetry -cycle troponins -will keep NPO at midnight and place for stress test tomorrow -He has not had a repeat echo since he had noted elevation of ST segment during repair of dissection, will repeat echo to assess for WMA and current EF.  -Will add statin   2. HTN: Blood pressure remained slightly elevated given his history of dissection. Was previously on losartan 100 mg daily. Given his renal function is stable will restart losartan 50 mg daily, and monitor  blood pressure for a need to add another agent.  3. Hx of substance abuse: Patient denies any recent use of cocaine.    Janice Coffin, NP-C Pager [phone] 01/07/2016, 4:16 PM   I personally saw and evaluated the patient emergency room this evening along with Ms. Su Hilt, NP-C. I agree with her  findings, examination and recommendations.  Relatively concave situation with a 88 year old gentleman history of type a aortic dissection repair back in February. Somewhat unclear, but it appears that perioperatively there was significant ST elevations on EKG that was thought to be potentially related to air embolism in the RCA or dissection flap closing the RCA temporarily. Unfortunately there has not been another evaluation of his heart since the operation. His EKG now shows clear signs of evolutionary changes from large inferior ST elevation MI. None of these changes are acute. However he is sent with worsening symptoms of dyspnea and PND. He went to his PCPs office and had an elevated proBNP level (BNP here is relatively normal) and this abnormal EKG that was read as having new T-wave inversions (in fact this is simply evolutionary change from an inferior STEMI)  He basically is presenting with class II heart failure symptoms. We are not aware of his EF postoperatively which could be reduced after an MI. Think the best most prudent course of action is to admit him under observation status and check an echocardiogram the morning to know his cardiac function.  -- I would also plan for ischemic evaluation with a noninvasive stress test. He also needs to be placed on appropriate cardiac medications. He is only on clonidine. I will start half of his preoperative home dose of losartan. We will also start him on Lasix.  Pending the echo and stress test results, he may be stable for discharge if he is feeling well enough, versus considering further testing.    Marykay Lex, M.D.,  M.S. Interventional Cardiologist   Pager # [phone] Phone # [phone] [address] [place], [place] [address]

## 2016-01-07 NOTE — H&P (Signed)
H & P    Patient ID: [name] MRN: [id number], DOB/AGE: 09/18/1960   Admit date: 01/07/2016 Date of Consult: 01/07/2016  Primary Physician: Lora Paula, MD Primary Cardiologist: New  Requesting Provider:   Patient Profile    55 yo male with PMH of malignant HTN/CVA/s/p Thoracic aortic dissection repair 10/28/2015 by Dr. Donata Clay presented to the [facility] ED with orthopnea, PND and fatigue for the past 3 days.  Past Medical History   Past Medical History  Diagnosis Date  . Malignant hypertension Dx 2016  . Aortic dissection, thoracic (HCC) 10/28/2015  . CVA (cerebral infarction)     Past Surgical History  Procedure Laterality Date  . Rectal abscess    . Thoracic aortic aneurysm repair N/A 10/28/2015    Procedure: TYPE A AORTIC DISSECTION REPAIR WITH CIRC ARREST;  Surgeon: Kerin Perna, MD;  Location: [facility] OR;  Service: Open Heart Surgery;  Laterality: N/A;  . Tee without cardioversion N/A 10/28/2015    Procedure: TRANSESOPHAGEAL ECHOCARDIOGRAM (TEE);  Surgeon: Kerin Perna, MD;  Location: [facility] OR;  Service: Open Heart Surgery;  Laterality: N/A;     Allergies  No Known Allergies  History of Present Illness    Mr. [name] is a 55 yo male with PMH of malignant HTN/CVA/s/p Thoracic aortic dissection repair 10/28/2015 by Dr. Donata Clay. He denies having seen a cardiologist in the past, but there is one visit with Dr. Daleen Squibb noted in the chart from 2016. Most recently he was admitted in February of this year when he presented to the ER with acute onset of left visual field loss with a sequential diagnosis of right occipital CVA and occlusion of right vertebral artery. At that time he reported a history of cocaine abuse, using at least 2-3 days during the week. Also reported poor compliance with all medications and noted to be hypertensive with significantly different blood pressure recordings in his left and right arm. CTA at that time showed a new tear in the ascending aorta  with the tear obstructing the flow to the right carotid and right subclavian. During this admission he underwent a repair of type A thoracic aortic dissection via Dr. Donata Clay. His echocardiogram prior to surgery showed EF of 60-65%. During surgery was noted to have acute elevation of ST segment on EKG,  which was felt 7 secondary to air or possibly coronary disease. Had transient a-fib after repair and placed on amiodarone. He was discharged home on amiodarone 200 mg along with clonidine 0.2 mg, and his losartan 100 mg tablet along with metoprolol tartrate 25 mg tablet was stopped. He was most recently seen by Dr. Donata Clay on 12/02/2015 was noted that he had good initial recovery after repair and it was stressed to him at this time the importance of good blood pressure control to abstain from smoking cocaine use. He was scheduled for follow-up in 6-8 weeks her blood pressure recheck.   Most recently he reports a 2 day history of orthopnea and post nocturnal dyspnea, which he was recently seen for in the office yesterday by Dr. Armen Pickup. He was started back on Lasix 40 mg daily and chest x-ray was ordered. Patient reports he did not fill his Lasix prescription and was called back this morning by Dr. Armen Pickup. He was seen again in the office this morning and noted to have possible abnormal EKG. He was sent to the ER for emergent evaluation of elevated pro BNP, and reported EKG changes.  In the ER his labs showed normal electrolyte panel, BNP 122.7, hemoglobin 13.5, and stable renal function with a creatinine of 1.02. He had a CTA which was negative, along with a chest x-ray which is normal. EKG remains unchanged SR Rate-84 with left atrial enlargement, inverted T waves and diffuse ST changes. He denies any chest pain or pressure, dizziness, palpitations, or lightheadedness. Noted to be slightly hypertensive while in the ER, heart rate in the 80s.   Cardiology has been called in reference to the abnormal  EKG.  Inpatient Medications      Family History    Family History  Problem Relation Age of Onset  . Diabetes Father   . Heart disease Father   . Dementia Father     Social History    Social History   Social History  . Marital Status: Divorced    Spouse Name: N/A  . Number of Children: N/A  . Years of Education: N/A   Occupational History  . Not on file.   Social History Main Topics  . Smoking status: Current Every Day Smoker    Types: Cigarettes  . Smokeless tobacco: Not on file  . Alcohol Use: No  . Drug Use: No     Comment: Patient denies marijuana and cociane use 01/19/15  . Sexual Activity: Yes   Other Topics Concern  . Not on file   Social History Narrative     Review of Systems    General:  No chills, fever, night sweats or weight changes.  Cardiovascular:  No chest pain, dyspnea on exertion, edema, + orthopnea, palpitations, + paroxysmal nocturnal dyspnea. Dermatological: No rash, lesions/masses Respiratory: No cough, + dyspnea Urologic: No hematuria, dysuria Abdominal:   No nausea, vomiting, diarrhea, bright red blood per rectum, melena, or hematemesis Neurologic:  No visual changes, wkns, changes in mental status. All other systems reviewed and are otherwise negative except as noted above.  Physical Exam    Blood pressure 150/95, pulse 89, temperature 98.2 F (36.8 C), temperature source Oral, resp. rate 18, height  (1.727 m), weight 232 lb 8 oz (105.461 kg), SpO2 97 %.  General: Pleasant male, NAD Psych: Normal affect. Neuro: Alert and oriented X 3. Moves all extremities spontaneously. HEENT: Normal  Neck: Supple without bruits or JVD. Lungs:  Resp regular and unlabored, CTA. Heart: RRR no s3, s4, or murmurs. Abdomen: Soft, non-tender, non-distended, BS + x 4.  Extremities: No clubbing, cyanosis or edema. DP/PT/Radials 2+ and equal bilaterally.  Labs    Troponin [facility] Test)  Recent Labs  01/07/16 1201  TROPIPOC 0.02     Recent Labs  01/07/16 1629  TROPONINI 0.03   Lab Results  Component Value Date   WBC 6.3 01/07/2016   HGB 13.5 01/07/2016   HCT 44.1 01/07/2016   MCV 78.2 01/07/2016   PLT 307 01/07/2016     Recent Labs Lab 01/07/16 1204  NA 139  K 4.7  CL 107  CO2 23  BUN 23*  CREATININE 1.02  CALCIUM 9.2  GLUCOSE 97   Lab Results  Component Value Date   CHOL 120 10/27/2015   HDL 35* 10/27/2015   LDLCALC 73 10/27/2015   TRIG 61 10/27/2015   Lab Results  Component Value Date   DDIMER 7.70* 10/26/2015     Radiology Studies    Dg Chest 2 View  01/07/2016  CLINICAL DATA:  Shortness of breath. Chest pain. History of aortic dissection. EXAM: CHEST  2 VIEW COMPARISON:  Chest x-ray 12/02/2015.  Chest CT 10/25/2015. FINDINGS: Lingular atelectasis in the lingula. Right lung is clear. No effusions. Heart is normal size. Prior median sternotomy. No acute bony abnormality. IMPRESSION: Lingular atelectasis.  No active disease. Electronically Signed   By: Charlett NoseKevin  Dover M.D.   On: 01/07/2016 12:26   Ct Angio Chest/abd/pel For Dissection W And/or W/wo  01/07/2016  CLINICAL DATA:  Chest pain and shortness of Breath, history of aortic dissection repair. EXAM: CT ANGIOGRAPHY CHEST, ABDOMEN AND PELVIS TECHNIQUE: Multidetector CT imaging through the chest, abdomen and pelvis was performed using the standard protocol during bolus administration of intravenous contrast. Multiplanar reconstructed images and MIPs were obtained and reviewed to evaluate the vascular anatomy. CONTRAST:  100 mL Isovue 370. COMPARISON:  10/25/2015 FINDINGS: CTA CHEST FINDINGS The lungs are well aerated bilaterally. Some mild periaortic atelectasis is noted in the left lower lobe. This is stable from the prior exam. No new focal infiltrate or effusion is seen. The thoracic inlet shows evidence of dissection flaps in the proximal portions of the left subclavian artery as well as within the proximal portion of the right subclavian  and right common carotid artery. The 5 seen previously in the origin of the right innominate artery is not as well appreciated following the interval surgery. There are changes consistent with ascending aortic repair without recurrent dissection. There remains an intimal flap in the distal aspect of the thoracic aortic arch with origin of the left vertebral and left subclavian artery from the residual false lumen. This false lumen extends only for few cm proximally. Second false lumen is noted in the descending thoracic aorta. This extends to the level of the aortic hiatus and into the abdominal aorta similar to that seen on the prior exam. No hilar or mediastinal adenopathy is noted. Changes of prior median sternotomy are noted. No acute bony abnormality is seen. Pulmonary artery is within normal limits. Review of the MIP images confirms the above findings. CTA ABDOMEN AND PELVIS FINDINGS The dissection flap extends inferiorly in the abdominal aorta and subsequently into the left common iliac artery. It does not extend inferiorly into the internal or external iliac arteries. There is a focal defect in the flap just above the origin of the left renal artery best seen on image number 110 of series 5. This allows perfusion of the false lumen. The celiac axis, superior mesenteric artery and dual right renal arteries arise from the true lumen. The left renal artery and inferior mesenteric artery arise from the false lumen. Adequate perfusion of the iliac arteries is noted bilaterally. The liver, spleen, adrenal glands, pancreas and gallbladder are normal in their CT appearance. Kidneys demonstrate a normal enhancement pattern bilaterally. Staghorn calculus is noted in the lower pole of the right kidney measuring approximately 2.5 cm in greatest dimension. This was not as well appreciated on the prior exam due to the timing of the contrast bolus. The appendix is within normal limits. No significant diverticulitis is  seen. The bladder is partially distended. No pelvic mass lesion is noted. The osseous structures show degenerative change of the lumbar spine and right hip joint. Review of the MIP images confirms the above findings. IMPRESSION: CTA of the chest: Status post repair of aortic dissection with widely patent ascending aortic graft. Dissection flaps are again identified within the proximal portions of the right common carotid artery, right subclavian artery and proximal left subclavian artery. Persistent small false lumen at the distal aspect of the aortic arch with perfusion  of the left vertebral artery and left subclavian artery from this outpouching. Second area of dissection in the descending thoracic aorta similar to that seen on the prior exam. It perfuse is via an intimal flap just above the left renal artery. Stable left basilar atelectasis CTA of the abdomen and pelvis: Continued dissection of the abdominal aorta with extension into the left common iliac artery. The overall appearance is similar to that seen on the prior CT examination. Per fusion of the visceral vessels is as described above with only the inferior mesenteric artery and left renal artery being perfuse by the false lumen. Right staghorn calculus. No other focal abnormality is seen. Electronically Signed   By: Alcide Clever M.D.   On: 01/07/2016 15:11    ECG & Cardiac Imaging    EKG: 01/07/2016:  EKG remains unchanged SR Rate-84 with left atrial enlargement, inverted T waves and diffuse ST changes.  Echo 10/26/2015:   Study Conclusions  - Left ventricle: The cavity size was normal. There was moderate  concentric hypertrophy. Systolic function was normal. The  estimated ejection fraction was in the range of 60% to 65%. Wall  motion was normal; there were no regional wall motion  abnormalities. There was an increased relative contribution of  atrial contraction to ventricular filling. Doppler parameters are  consistent with  abnormal left ventricular relaxation (grade 1  diastolic dysfunction). - Aortic valve: Trileaflet; mildly thickened, mildly calcified  leaflets. There was trivial regurgitation. - Aorta: Aortic root dimension: 40 mm (ED). Ascending aorta  diameter: 50 mm (ED). - Mitral valve: There was trivial regurgitation.  Assessment & Plan    55 yo male with PMH of malignant HTN/CVA/s/p Thoracic aortic dissection repair 10/28/2015 by Dr. Donata Clay presented to the [facility] ED with orthopnea, PND and fatigue for the past 3 days.  1. Dyspnea: Patient reports three-day history of orthopnea, post nocturnal dyspnea which she was recently seen in PCP office for an prescribed 40 mg of Lasix daily. Unfortunately patient did not fill this prescription and was seen back in PCP office this morning for an elevated proBNP and possible abnormal EKG changes. On further evaluation EKG changes were noted not to acute. PCO trop neg, along with one Lab trop that was negative. BNP was 122.7. Patient denied any chest pain/pressure palpitations, dizziness,or lightheadedness. Given his history of thoracic aortic dissection repair, along with malignant hypertension and noncompliance in the past with blood pressure medications, I believe he would benefit from repeat echo along with stress test.  -He is not fluid overloaded on exam, chest x-ray was clear, and BMP was mildly elevated. Will order previous Lasix dose of 40mg  IV daily. Monitor I&O. -Admit to telemetry -cycle troponins -will keep NPO at midnight and place for stress test tomorrow -He has not had a repeat echo since he had noted elevation of ST segment during repair of dissection, will repeat echo to assess for WMA and current EF.  -Will add statin   2. HTN: Blood pressure remained slightly elevated given his history of dissection. Was previously on losartan 100 mg daily. Given his renal function is stable will restart losartan 50 mg daily, and monitor blood pressure  for a need to add another agent.  3. Hx of substance abuse: Patient denies any recent use of cocaine.    Janice Coffin, NP-C Pager [phone] 01/07/2016, 8:01 PM   I personally saw and evaluated the patient emergency room this evening along with Ms. Su Hilt, NP-C. I agree with  her findings, examination and recommendations.  Relatively concave situation with a 66 year old gentleman history of type a aortic dissection repair back in February. Somewhat unclear, but it appears that perioperatively there was significant ST elevations on EKG that was thought to be potentially related to air embolism in the RCA or dissection flap closing the RCA temporarily. Unfortunately there has not been another evaluation of his heart since the operation. His EKG now shows clear signs of evolutionary changes from large inferior ST elevation MI. None of these changes are acute. However he is sent with worsening symptoms of dyspnea and PND. He went to his PCPs office and had an elevated proBNP level (BNP here is relatively normal) and this abnormal EKG that was read as having new T-wave inversions (in fact this is simply evolutionary change from an inferior STEMI)  He basically is presenting with class II heart failure symptoms. We are not aware of his EF postoperatively which could be reduced after an MI. Think the best most prudent course of action is to admit him under observation status and check an echocardiogram the morning to know his cardiac function.  -- I would also plan for ischemic evaluation with a noninvasive stress test. He also needs to be placed on appropriate cardiac medications. He is only on clonidine. I will start half of his preoperative home dose of losartan. We will also start him on Lasix.  Pending the echo and stress test results, he may be stable for discharge if he is feeling well enough, versus considering further testing.    Marykay Lex, M.D., M.S. Interventional  Cardiologist   Pager # [phone] Phone # [phone] [address] [place], [place] [address]

## 2016-01-07 NOTE — Progress Notes (Signed)
[facility] 01/06/2016 12/21/2015 11/25/2015 03/26/2015 03/19/2015  Decreased Interest 1 2 1  0 0  Down, Depressed, Hopeless 0 0 2 0 0  PHQ - 2 Score 1 2 3  0 0  Altered sleeping 3 2 2  - -  Tired, decreased energy 2 2 2  - -  Change in appetite 0 0 2 - -  Feeling bad or failure about yourself  0 0 1 - -  Trouble concentrating 0 0 1 - -  Moving slowly or fidgety/restless 0 0 1 - -  Suicidal thoughts 0 0 0 - -  PHQ-9 Score 6 6 12  - -  Difficult doing work/chores - Not difficult at all - - -   GAD 7 : Generalized Anxiety Score 01/06/2016 12/21/2015 11/25/2015  Nervous, Anxious, on Edge 2 1 1   Control/stop worrying 1 1 1   Worry too much - different things 1 0 1  Trouble relaxing 2 2 1   Restless 2 1 0  Easily annoyed or irritable 1 1 0  Afraid - awful might happen 2 0 0  Total GAD 7 Score 11 6 4

## 2016-01-07 NOTE — Progress Notes (Signed)
Refused bed alarm. Will continue to monitor patient. 

## 2016-01-07 NOTE — Patient Instructions (Addendum)
History of thoracic aortic dissection rupture and repair 2 months ago. Persistent orthopnea Elevated pro BNP to 1100 yesterday Abnormal EKG in office today   No CP. Patient has been in follow up with his cardiothoracic surgeon He does not have an outpatient cardiologist  Sent to ED for emergent evaluation of cause of elevated pro BNP and EKG changes r/o myocardial infarction.   Dr. Armen PickupFunches

## 2016-01-07 NOTE — ED Notes (Signed)
EKG given to dr.wen. Pt to be placed in next room.

## 2016-01-07 NOTE — ED Provider Notes (Signed)
CSN: [id number]     Arrival date & time 01/07/16  1145 History   First MD Initiated Contact with Patient 01/07/16 1214     Chief Complaint  Patient presents with  . Shortness of Breath  . Chest Pain     (Consider location/radiation/quality/duration/timing/severity/associated sxs/prior Treatment) HPI  Past Medical History  Diagnosis Date  . Malignant hypertension Dx 2016  . Aortic dissection, thoracic (HCC) 10/28/2015  . CVA (cerebral infarction)    Past Surgical History  Procedure Laterality Date  . Rectal abscess    . Thoracic aortic aneurysm repair N/A 10/28/2015    Procedure: TYPE A AORTIC DISSECTION REPAIR WITH CIRC ARREST;  Surgeon: Kerin PernaPeter Van Trigt, MD;  [facility];  Service: Open Heart Surgery;  Laterality: N/A;  . Tee without cardioversion N/A 10/28/2015    Procedure: TRANSESOPHAGEAL ECHOCARDIOGRAM (TEE);  Surgeon: Kerin PernaPeter Van Trigt, MD;  [facility];  Service: Open Heart Surgery;  Laterality: N/A;   Family History  Problem Relation Age of Onset  . Diabetes Father   . Heart disease Father   . Dementia Father    Social History  Substance Use Topics  . Smoking status: Current Every Day Smoker    Types: Cigarettes  . Smokeless tobacco: None  . Alcohol Use: No    Review of Systems    Allergies  Review of patient's allergies indicates no known allergies.  Home Medications   Prior to Admission medications   Medication Sig Start Date End Date Taking? Authorizing Provider  aspirin EC 325 MG EC tablet Take 1 tablet (325 mg total) by mouth daily. 11/05/15  Yes Wayne E Gold, PA-C  cetirizine (ZYRTEC) 10 MG tablet Take 1 tablet (10 mg total) by mouth daily. 01/06/16  Yes Josalyn Funches, MD  cloNIDine (CATAPRES) 0.2 MG tablet Take 1 tablet (0.2 mg total) by mouth 2 (two) times daily. 11/12/15  Yes Daniel J Angiulli, PA-C  fluticasone (FLONASE) 50 MCG/ACT nasal spray Place 2 sprays into both nostrils daily. 01/06/16  Yes Josalyn Funches, MD  furosemide (LASIX) 40 MG  tablet Take 1 tablet (40 mg total) by mouth daily. 01/06/16  Yes Josalyn Funches, MD  magnesium citrate SOLN Take 1 Bottle by mouth once.   Yes Historical Provider, MD  potassium chloride SA (K-DUR,KLOR-CON) 20 MEQ tablet Take 1 tablet (20 mEq total) by mouth daily. 01/06/16  Yes Josalyn Funches, MD  traZODone (DESYREL) 50 MG tablet Take 0.5-1 tablets (25-50 mg total) by mouth at bedtime as needed for sleep. 01/06/16  Yes Josalyn Funches, MD  polyethylene glycol (MIRALAX / GLYCOLAX) packet Take 17 g by mouth daily as needed. 12/21/15   Josalyn Funches, MD   BP 134/90 mmHg  Pulse 76  Temp(Src) 98 F (36.7 C) (Oral)  Resp 21  Ht 5\' 8"  (1.727 m)  Wt 106.142 kg  BMI 35.59 kg/m2  SpO2 97% Physical Exam  ED Course  Procedures (including critical care time) Labs Review Labs Reviewed  BASIC METABOLIC PANEL - Abnormal; Notable for the following:    BUN 23 (*)    All other components within normal limits  CBC - Abnormal; Notable for the following:    MCH 23.9 (*)    All other components within normal limits  BRAIN NATRIURETIC PEPTIDE - Abnormal; Notable for the following:    B Natriuretic Peptide 122.7 (*)    All other components within normal limits  TROPONIN I  Rosezena SensorI-STAT TROPOININ, ED    Imaging Review Dg Chest 2 View  01/07/2016  CLINICAL DATA:  Shortness of breath. Chest pain. History of aortic dissection. EXAM: CHEST  2 VIEW COMPARISON:  Chest x-ray 12/02/2015.  Chest CT 10/25/2015. FINDINGS: Lingular atelectasis in the lingula. Right lung is clear. No effusions. Heart is normal size. Prior median sternotomy. No acute bony abnormality. IMPRESSION: Lingular atelectasis.  No active disease. Electronically Signed   By: Charlett Nose M.D.   On: 01/07/2016 12:26   Ct Angio Chest/abd/pel For Dissection W And/or W/wo  01/07/2016  CLINICAL DATA:  Chest pain and shortness of Breath, history of aortic dissection repair. EXAM: CT ANGIOGRAPHY CHEST, ABDOMEN AND PELVIS TECHNIQUE: Multidetector CT  imaging through the chest, abdomen and pelvis was performed using the standard protocol during bolus administration of intravenous contrast. Multiplanar reconstructed images and MIPs were obtained and reviewed to evaluate the vascular anatomy. CONTRAST:  100 mL Isovue 370. COMPARISON:  10/25/2015 FINDINGS: CTA CHEST FINDINGS The lungs are well aerated bilaterally. Some mild periaortic atelectasis is noted in the left lower lobe. This is stable from the prior exam. No new focal infiltrate or effusion is seen. The thoracic inlet shows evidence of dissection flaps in the proximal portions of the left subclavian artery as well as within the proximal portion of the right subclavian and right common carotid artery. The 5 seen previously in the origin of the right innominate artery is not as well appreciated following the interval surgery. There are changes consistent with ascending aortic repair without recurrent dissection. There remains an intimal flap in the distal aspect of the thoracic aortic arch with origin of the left vertebral and left subclavian artery from the residual false lumen. This false lumen extends only for few cm proximally. Second false lumen is noted in the descending thoracic aorta. This extends to the level of the aortic hiatus and into the abdominal aorta similar to that seen on the prior exam. No hilar or mediastinal adenopathy is noted. Changes of prior median sternotomy are noted. No acute bony abnormality is seen. Pulmonary artery is within normal limits. Review of the MIP images confirms the above findings. CTA ABDOMEN AND PELVIS FINDINGS The dissection flap extends inferiorly in the abdominal aorta and subsequently into the left common iliac artery. It does not extend inferiorly into the internal or external iliac arteries. There is a focal defect in the flap just above the origin of the left renal artery best seen on image number 110 of series 5. This allows perfusion of the false lumen.  The celiac axis, superior mesenteric artery and dual right renal arteries arise from the true lumen. The left renal artery and inferior mesenteric artery arise from the false lumen. Adequate perfusion of the iliac arteries is noted bilaterally. The liver, spleen, adrenal glands, pancreas and gallbladder are normal in their CT appearance. Kidneys demonstrate a normal enhancement pattern bilaterally. Staghorn calculus is noted in the lower pole of the right kidney measuring approximately 2.5 cm in greatest dimension. This was not as well appreciated on the prior exam due to the timing of the contrast bolus. The appendix is within normal limits. No significant diverticulitis is seen. The bladder is partially distended. No pelvic mass lesion is noted. The osseous structures show degenerative change of the lumbar spine and right hip joint. Review of the MIP images confirms the above findings. IMPRESSION: CTA of the chest: Status post repair of aortic dissection with widely patent ascending aortic graft. Dissection flaps are again identified within the proximal portions of the right common carotid artery, right subclavian artery  and proximal left subclavian artery. Persistent small false lumen at the distal aspect of the aortic arch with perfusion of the left vertebral artery and left subclavian artery from this outpouching. Second area of dissection in the descending thoracic aorta similar to that seen on the prior exam. It perfuse is via an intimal flap just above the left renal artery. Stable left basilar atelectasis CTA of the abdomen and pelvis: Continued dissection of the abdominal aorta with extension into the left common iliac artery. The overall appearance is similar to that seen on the prior CT examination. Per fusion of the visceral vessels is as described above with only the inferior mesenteric artery and left renal artery being perfuse by the false lumen. Right staghorn calculus. No other focal abnormality is  seen. Electronically Signed   By: Alcide Clever M.D.   On: 01/07/2016 15:11   I have personally reviewed and evaluated these images and lab results as part of my medical decision-making.   EKG Interpretation   Date/Time:  Friday January 07 2016 11:54:53 EDT Ventricular Rate:  84 PR Interval:  160 QRS Duration: 90 QT Interval:  402 QTC Calculation: 475 R Axis:   13 Text Interpretation:  Normal sinus rhythm Possible Left atrial enlargement  Inferior infarct , age undetermined Cannot rule out Anterior infarct , age  undetermined ST \\T \ T wave abnormality, consider lateral ischemia Abnormal  ECG Compared to EKG 10/29/15 elevation in III improved, new T wave  inversions in II, aVF, V4-V6 Confirmed by NGUYEN, EMILY ([id number]) on  01/07/2016 12:00:38 PM Also confirmed by Cyndie Chime, EMILY ([id number]), editor  Wandalee Ferdinand [phone])  on 01/07/2016 12:44:06 PM      MDM   Final diagnoses:  Chest pain, unspecified chest pain type    Cardiology evaluated pt and will admit for observation.    Lonia Skinner [place], PA-C 01/07/16 1740  Leta Baptist, MD 01/21/16 [phone]

## 2016-01-07 NOTE — Progress Notes (Signed)
[facility]  No pain today  No suicidal thoughts in the past two weeks

## 2016-01-07 NOTE — ED Provider Notes (Signed)
CSN: [id number]     Arrival date & time 01/07/16  1145 History   First MD Initiated Contact with Patient 01/07/16 1214     Chief Complaint  Patient presents with  . Shortness of Breath  . Chest Pain     (Consider location/radiation/quality/duration/timing/severity/associated sxs/prior Treatment) Patient is a 55 y.o. male presenting with shortness of breath and chest pain. The history is provided by the patient and medical records.  Shortness of Breath Associated symptoms: chest pain   Chest Pain Associated symptoms: shortness of breath     55 year old male with history of malignant hypertension which resulted in stroke and thoracic type A dissection in February 2016, presenting to the ED for chest pain and shortness of breath. Patient underwent operative repair of A thoracic dissection on 10/28/2015. Procedure went well and he has been recovering at home. He states he has continued to have some soreness of his chest postoperatively. Followed up in the cardiothoracic clinic and seemed to be doing well.  He states he has had some ongoing shortness of breath over the past 2 months, but got drastically worse over the past 3 days. He states shortness of breath is worse when lying down or with exertional activity. He states he continues to have some mild discomfort in the left side of his chest.  He was seen by his PCP today and sent here due to elevated BNP and new EKG changes. Patient states he has been taking all his medications as prescribed. He denies any fever, chills, sweats, or cough. No recent illness. Vital signs stable on arrival.  Past Medical History  Diagnosis Date  . Malignant hypertension Dx 2016  . Aortic dissection, thoracic [facility])    Past Surgical History  Procedure Laterality Date  . Rectal abscess    . Thoracic aortic aneurysm repair N/A 10/28/2015    Procedure: TYPE A AORTIC DISSECTION REPAIR WITH CIRC ARREST;  Surgeon: Kerin Perna, MD;  Location: [facility] OR;  Service: Open  Heart Surgery;  Laterality: N/A;  . Tee without cardioversion N/A 10/28/2015    Procedure: TRANSESOPHAGEAL ECHOCARDIOGRAM (TEE);  Surgeon: Kerin Perna, MD;  Location: [facility] OR;  Service: Open Heart Surgery;  Laterality: N/A;   Family History  Problem Relation Age of Onset  . Diabetes Father   . Heart disease Father   . Dementia Father    Social History  Substance Use Topics  . Smoking status: Current Every Day Smoker    Types: Cigarettes  . Smokeless tobacco: None  . Alcohol Use: No    Review of Systems  Respiratory: Positive for shortness of breath.   Cardiovascular: Positive for chest pain.  All other systems reviewed and are negative.     Allergies  Review of patient's allergies indicates no known allergies.  Home Medications   Prior to Admission medications   Medication Sig Start Date End Date Taking? Authorizing Provider  aspirin EC 325 MG EC tablet Take 1 tablet (325 mg total) by mouth daily. 11/05/15   Wayne E Gold, PA-C  cetirizine (ZYRTEC) 10 MG tablet Take 1 tablet (10 mg total) by mouth daily. 01/06/16   Josalyn Funches, MD  cloNIDine (CATAPRES) 0.2 MG tablet Take 1 tablet (0.2 mg total) by mouth 2 (two) times daily. 11/12/15   Mcarthur Rossetti Angiulli, PA-C  fluticasone (FLONASE) 50 MCG/ACT nasal spray Place 2 sprays into both nostrils daily. 01/06/16   Josalyn Funches, MD  furosemide (LASIX) 40 MG tablet Take 1 tablet (40 mg total) by mouth  daily. 01/06/16   Dessa Phi, MD  magnesium citrate SOLN Take 1 Bottle by mouth once.    Historical Provider, MD  polyethylene glycol (MIRALAX / GLYCOLAX) packet Take 17 g by mouth daily as needed. 12/21/15   Josalyn Funches, MD  potassium chloride SA (K-DUR,KLOR-CON) 20 MEQ tablet Take 1 tablet (20 mEq total) by mouth daily. 01/06/16   Josalyn Funches, MD  traZODone (DESYREL) 50 MG tablet Take 0.5-1 tablets (25-50 mg total) by mouth at bedtime as needed for sleep. 01/06/16   Josalyn Funches, MD   BP 144/99 mmHg  Pulse 86  Temp(Src)  98 F (36.7 C) (Oral)  Resp 16  Ht  (1.727 m)  Wt 106.142 kg  BMI 35.59 kg/m2  SpO2 100%   Physical Exam  Constitutional: He is oriented to person, place, and time. He appears well-developed and well-nourished. No distress.  HENT:  Head: Normocephalic and atraumatic.  Mouth/Throat: Oropharynx is clear and moist.  Eyes: Conjunctivae and EOM are normal. Pupils are equal, round, and reactive to light.  Neck: Normal range of motion. Neck supple.  Cardiovascular: Normal rate, regular rhythm and normal heart sounds.   Pulmonary/Chest: Effort normal and breath sounds normal. No respiratory distress. He has no wheezes.  Abdominal: Soft. Bowel sounds are normal. There is no tenderness. There is no guarding.  Midline sternotomy scar is healing without any signs of infection, minimally tender to palpation  Musculoskeletal: Normal range of motion.  Neurological: He is alert and oriented to person, place, and time.  Skin: Skin is warm and dry. He is not diaphoretic.  Psychiatric: He has a normal mood and affect.  Nursing note and vitals reviewed.   ED Course  Procedures (including critical care time) Labs Review Labs Reviewed  BASIC METABOLIC PANEL - Abnormal; Notable for the following:    BUN 23 (*)    All other components within normal limits  CBC - Abnormal; Notable for the following:    MCH 23.9 (*)    All other components within normal limits  BRAIN NATRIURETIC PEPTIDE - Abnormal; Notable for the following:    B Natriuretic Peptide 122.7 (*)    All other components within normal limits  Rosezena Sensor, ED    Imaging Review Dg Chest 2 View  01/07/2016  CLINICAL DATA:  Shortness of breath. Chest pain. History of aortic dissection. EXAM: CHEST  2 VIEW COMPARISON:  Chest x-ray 12/02/2015.  Chest CT 10/25/2015. FINDINGS: Lingular atelectasis in the lingula. Right lung is clear. No effusions. Heart is normal size. Prior median sternotomy. No acute bony abnormality. IMPRESSION:  Lingular atelectasis.  No active disease. Electronically Signed   By: Charlett Nose M.D.   On: 01/07/2016 12:26   Ct Angio Chest/abd/pel For Dissection W And/or W/wo  01/07/2016  CLINICAL DATA:  Chest pain and shortness of Breath, history of aortic dissection repair. EXAM: CT ANGIOGRAPHY CHEST, ABDOMEN AND PELVIS TECHNIQUE: Multidetector CT imaging through the chest, abdomen and pelvis was performed using the standard protocol during bolus administration of intravenous contrast. Multiplanar reconstructed images and MIPs were obtained and reviewed to evaluate the vascular anatomy. CONTRAST:  100 mL Isovue 370. COMPARISON:  10/25/2015 FINDINGS: CTA CHEST FINDINGS The lungs are well aerated bilaterally. Some mild periaortic atelectasis is noted in the left lower lobe. This is stable from the prior exam. No new focal infiltrate or effusion is seen. The thoracic inlet shows evidence of dissection flaps in the proximal portions of the left subclavian artery as well as within the  proximal portion of the right subclavian and right common carotid artery. The 5 seen previously in the origin of the right innominate artery is not as well appreciated following the interval surgery. There are changes consistent with ascending aortic repair without recurrent dissection. There remains an intimal flap in the distal aspect of the thoracic aortic arch with origin of the left vertebral and left subclavian artery from the residual false lumen. This false lumen extends only for few cm proximally. Second false lumen is noted in the descending thoracic aorta. This extends to the level of the aortic hiatus and into the abdominal aorta similar to that seen on the prior exam. No hilar or mediastinal adenopathy is noted. Changes of prior median sternotomy are noted. No acute bony abnormality is seen. Pulmonary artery is within normal limits. Review of the MIP images confirms the above findings. CTA ABDOMEN AND PELVIS FINDINGS The dissection  flap extends inferiorly in the abdominal aorta and subsequently into the left common iliac artery. It does not extend inferiorly into the internal or external iliac arteries. There is a focal defect in the flap just above the origin of the left renal artery best seen on image number 110 of series 5. This allows perfusion of the false lumen. The celiac axis, superior mesenteric artery and dual right renal arteries arise from the true lumen. The left renal artery and inferior mesenteric artery arise from the false lumen. Adequate perfusion of the iliac arteries is noted bilaterally. The liver, spleen, adrenal glands, pancreas and gallbladder are normal in their CT appearance. Kidneys demonstrate a normal enhancement pattern bilaterally. Staghorn calculus is noted in the lower pole of the right kidney measuring approximately 2.5 cm in greatest dimension. This was not as well appreciated on the prior exam due to the timing of the contrast bolus. The appendix is within normal limits. No significant diverticulitis is seen. The bladder is partially distended. No pelvic mass lesion is noted. The osseous structures show degenerative change of the lumbar spine and right hip joint. Review of the MIP images confirms the above findings. IMPRESSION: CTA of the chest: Status post repair of aortic dissection with widely patent ascending aortic graft. Dissection flaps are again identified within the proximal portions of the right common carotid artery, right subclavian artery and proximal left subclavian artery. Persistent small false lumen at the distal aspect of the aortic arch with perfusion of the left vertebral artery and left subclavian artery from this outpouching. Second area of dissection in the descending thoracic aorta similar to that seen on the prior exam. It perfuse is via an intimal flap just above the left renal artery. Stable left basilar atelectasis CTA of the abdomen and pelvis: Continued dissection of the  abdominal aorta with extension into the left common iliac artery. The overall appearance is similar to that seen on the prior CT examination. Per fusion of the visceral vessels is as described above with only the inferior mesenteric artery and left renal artery being perfuse by the false lumen. Right staghorn calculus. No other focal abnormality is seen. Electronically Signed   By: Alcide CleverMark  Lukens M.D.   On: 01/07/2016 15:11   I have personally reviewed and evaluated these images and lab results as part of my medical decision-making.   EKG Interpretation   Date/Time:  Friday January 07 2016 11:54:53 EDT Ventricular Rate:  84 PR Interval:  160 QRS Duration: 90 QT Interval:  402 QTC Calculation: 475 R Axis:   13 Text Interpretation:  Normal  sinus rhythm Possible Left atrial enlargement  Inferior infarct , age undetermined Cannot rule out Anterior infarct , age  undetermined ST \\T \ T wave abnormality, consider lateral ischemia Abnormal  ECG Compared to EKG 10/29/15 elevation in III improved, new T wave  inversions in II, aVF, V4-V6 Confirmed by NGUYEN, EMILY ([id number]) on  01/07/2016 12:00:38 PM      MDM   Final diagnoses:  Chest pain, unspecified chest pain type   55 year old male here with chest pain and shortness of breath. Seen by PCP yesterday and sent here due to abnormal labs and EKG changes. He is status post type A dissection and repair in February 2017. Overall he appears well. His vital signs are stable on room air. He states he has no chest discomfort at this time, but continues to be short of breath when he lays flat.  This has been ongoing for the past 2 months. EKG today does reveal new T-wave inversions in lead II and aVF as well as V4 through V6.  Question if these are changes post-operatively.  Lab work overall reassuring-- BNP from yesterday was 1100, today is only 122.  No vascular congestion noted on CXR.  Given his new chest pain, repeat CTA chest/abdomen/pelvis was obtained  which does not reveal any acute changes today, graft remains patent.  Results discussed with patient, he ackknowledged understanding. He does admit to me that he does feel somewhat anxious over his recent surgery and ongoing chest symptoms which is understandable, however, I do not feel this is the entire cause of his symptoms given new EKG changes.  Case discussed with cardiology, PA Gold-- will send provider down to evaluate.  Oncoming provider (PA Trinidad) to follow along and disposition per cardiology recommendations.  Garlon Hatchet, PA-C 01/07/16 1543  Leta Baptist, MD 01/21/16 [phone]

## 2016-01-07 NOTE — Telephone Encounter (Signed)
Called patient. Verified name He slept better last night. Pharmacy was closed so he did not get lasix, potassium or trazodone. Coming in this AM for meds.  Gave lab results and plan: Elevated pro BNP consistent with heart failure.  BMP normal  Patient needs the following: 1. Return for EKG to evaluate for changes, he will come in today for EKG and BP check. Added to schedule at 9:30 AM/9:45 AM.   2. Go for CXR, ordered yesterday   3. ECHO, ordered, to be scheduled   4. Cardiology follow up 5. Repeat BP check and add beta blocker and ACE/ARB

## 2016-01-07 NOTE — ED Notes (Signed)
Pt sent by DR.Funches from wellness clinic to elevated BNP 1100 and abnormal EKG. Pt states he has been sob for the last 3 days worse when laying down. Pt states he has a pain in the left side of his chest that started today at wellness clinic while he was laying flat. Pt is 2 months post op aortic dissection rupture and repair.

## 2016-01-08 ENCOUNTER — Observation Stay (HOSPITAL_COMMUNITY): Payer: Medicaid Other

## 2016-01-08 DIAGNOSIS — I7101 Dissection of thoracic aorta: Secondary | ICD-10-CM

## 2016-01-08 DIAGNOSIS — R0602 Shortness of breath: Secondary | ICD-10-CM | POA: Insufficient documentation

## 2016-01-08 DIAGNOSIS — R079 Chest pain, unspecified: Secondary | ICD-10-CM | POA: Diagnosis not present

## 2016-01-08 DIAGNOSIS — R0600 Dyspnea, unspecified: Secondary | ICD-10-CM

## 2016-01-08 DIAGNOSIS — I10 Essential (primary) hypertension: Secondary | ICD-10-CM | POA: Diagnosis not present

## 2016-01-08 DIAGNOSIS — I255 Ischemic cardiomyopathy: Secondary | ICD-10-CM | POA: Diagnosis not present

## 2016-01-08 HISTORY — PX: NM MYOVIEW LTD: HXRAD82

## 2016-01-08 LAB — NM MYOCAR MULTI W/SPECT W/WALL MOTION / EF
CHL CUP MPHR: 166 {beats}/min
CHL CUP RESTING HR STRESS: 88 {beats}/min
CSEPEW: 1 METS
CSEPHR: 67 %
CSEPPHR: 112 {beats}/min
Exercise duration (min): 0 min
Exercise duration (sec): 0 s

## 2016-01-08 LAB — CBC
HCT: 45.6 % (ref 39.0–52.0)
HEMOGLOBIN: 14.1 g/dL (ref 13.0–17.0)
MCH: 24.3 pg — ABNORMAL LOW (ref 26.0–34.0)
MCHC: 30.9 g/dL (ref 30.0–36.0)
MCV: 78.5 fL (ref 78.0–100.0)
PLATELETS: 362 10*3/uL (ref 150–400)
RBC: 5.81 MIL/uL (ref 4.22–5.81)
RDW: 15.7 % — AB (ref 11.5–15.5)
WBC: 6.7 10*3/uL (ref 4.0–10.5)

## 2016-01-08 LAB — BASIC METABOLIC PANEL
Anion gap: 11 (ref 5–15)
BUN: 20 mg/dL (ref 6–20)
CHLORIDE: 104 mmol/L (ref 101–111)
CO2: 23 mmol/L (ref 22–32)
CREATININE: 1.2 mg/dL (ref 0.61–1.24)
Calcium: 8.9 mg/dL (ref 8.9–10.3)
GFR calc non Af Amer: >60 mL/min (ref >60)
Glucose, Bld: 104 mg/dL — ABNORMAL HIGH (ref 65–99)
Potassium: 3.7 mmol/L (ref 3.5–5.1)
Sodium: 138 mmol/L (ref 135–145)

## 2016-01-08 LAB — TROPONIN I
Troponin I: 0.03 ng/mL (ref <0.031)
Troponin I: <0.03 ng/mL (ref <0.031)

## 2016-01-08 LAB — LIPID PANEL
Cholesterol: 164 mg/dL (ref 0–200)
HDL: 32 mg/dL — ABNORMAL LOW (ref >40)
LDL CALC: 112 mg/dL — AB (ref 0–99)
TRIGLYCERIDES: 102 mg/dL (ref <150)
Total CHOL/HDL Ratio: 5.1 RATIO
VLDL: 20 mg/dL (ref 0–40)

## 2016-01-08 MED ORDER — REGADENOSON 0.4 MG/5ML IV SOLN
INTRAVENOUS | Status: AC
  Filled 2016-01-08: qty 5

## 2016-01-08 MED ORDER — REGADENOSON 0.4 MG/5ML IV SOLN
0.4000 mg | Freq: Once | INTRAVENOUS | Status: AC
  Administered 2016-01-08: 0.4 mg via INTRAVENOUS
  Filled 2016-01-08: qty 5

## 2016-01-08 MED ORDER — TECHNETIUM TC 99M SESTAMIBI GENERIC - CARDIOLITE
10.0000 | Freq: Once | INTRAVENOUS | Status: AC | PRN
  Administered 2016-01-08: 10 via INTRAVENOUS

## 2016-01-08 MED ORDER — TECHNETIUM TC 99M SESTAMIBI GENERIC - CARDIOLITE
30.0000 | Freq: Once | INTRAVENOUS | Status: AC | PRN
  Administered 2016-01-08: 30 via INTRAVENOUS

## 2016-01-08 NOTE — Progress Notes (Signed)
1 min, Hal PA at bedside, pt denies any symptoms.  

## 2016-01-08 NOTE — Progress Notes (Signed)
Patient refused bed alarm. Will continue to monitor patient. 

## 2016-01-08 NOTE — Progress Notes (Signed)
7 min, Hal PA at bedside, pt denies CP/SOB  Procedure has ended

## 2016-01-08 NOTE — Progress Notes (Signed)
IMPRESSION: 1. Large fixed defect involving the inferior and lateral walls.  2. Global hypokinesis.  3. Left ventricular ejection fraction 29%  4. High-risk stress test findings*.  Pending echo to further assess LV function  Signed, Azalee CourseHao Lisandro Meggett PA Pager: [phone]

## 2016-01-08 NOTE — Progress Notes (Signed)
5 min, Hal PA at bedside, pt denies CP or any other associating symptoms, pt states "heavy breathing stoped"

## 2016-01-08 NOTE — Progress Notes (Signed)
Pre-stress test vs

## 2016-01-08 NOTE — Progress Notes (Signed)
3 min, Hal PA at bedside, pt reports some "heavy breathing", pt denies CP, or any other associated symptoms

## 2016-01-08 NOTE — Progress Notes (Signed)
Patient Name: [name] Date of Encounter: 01/08/2016  Patient profile 55 yo male with PMH of malignant HTN/CVA/s/p Thoracic aortic dissection repair 10/28/2015 by Dr. Donata Clay presented to the [facility] ED with orthopnea, PND and fatigue for the past 3 days.   Principal Problem:   Dyspnea Active Problems:   Pre-diabetes   Aortic dissection, thoracic (HCC)    SUBJECTIVE  SOB improved overnight. No CP.   CURRENT MEDS . aspirin EC  81 mg Oral Daily  . atorvastatin  40 mg Oral q1800  . cloNIDine  0.2 mg Oral BID  . fluticasone  2 spray Each Nare Daily  . furosemide  40 mg Oral Daily  . heparin  5,000 Units Subcutaneous Q8H  . loratadine  10 mg Oral Daily  . losartan  50 mg Oral Daily  . regadenoson  0.4 mg Intravenous Once    OBJECTIVE  Filed Vitals:   01/07/16 1830 01/07/16 1937 01/08/16 0519 01/08/16 0950  BP: 123/92 150/95 147/98 153/100  Pulse: 91 89 91 85  Temp:  98.2 F (36.8 C) 97.9 F (36.6 C)   TempSrc:  Oral Oral   Resp: Height:   (1.727 m)    Weight:  232 lb 8 oz (105.461 kg) 228 lb 12.8 oz (103.783 kg)   SpO2: 95% 97% 100%     Intake/Output Summary (Last 24 hours) at 01/08/16 1008 Last data filed at 01/08/16 0316  Gross per 24 hour  Intake    236 ml  Output    725 ml  Net   -489 ml   Filed Weights   01/07/16 1152 01/07/16 1937 01/08/16 0519  Weight: 234 lb (106.142 kg) 232 lb 8 oz (105.461 kg) 228 lb 12.8 oz (103.783 kg)    PHYSICAL EXAM  General: Pleasant, NAD. Neuro: Alert and oriented X 3. Moves all extremities spontaneously. Psych: Normal affect. HEENT:  Normal  Neck: Supple without bruits or JVD. Lungs:  Resp regular and unlabored, CTA. Heart: RRR no s3, s4, or murmurs. Abdomen: Soft, non-tender, non-distended, BS + x 4.  Extremities: No clubbing, cyanosis or edema. DP/PT/Radials 2+ and equal bilaterally.  Accessory Clinical Findings  CBC  Recent Labs  01/07/16 1204 01/07/16 2155  WBC 6.3 6.4  HGB  13.5 13.4  HCT 44.1 43.0  MCV 78.2 77.8*  PLT 307 308   Basic Metabolic Panel  Recent Labs  01/06/16 1753 01/07/16 1204 01/07/16 2155  NA 140 139  --   K 4.9 4.7  --   CL 105 107  --   CO2 21 23  --   GLUCOSE 79 97  --   BUN 22 23*  --   CREATININE 1.13 1.02 1.13  CALCIUM 9.1 9.2  --    Liver Function Tests  Recent Labs  01/07/16 2155  AST 16  ALT 14*  ALKPHOS 91  BILITOT 0.4  PROT 7.6  ALBUMIN 3.5   Cardiac Enzymes  Recent Labs  01/07/16 1629 01/07/16 2155 01/08/16 0243  TROPONINI 0.03 0.03 0.03   Fasting Lipid Panel  Recent Labs  01/08/16 0243  CHOL 164  HDL 32*  LDLCALC 112*  TRIG 102  CHOLHDL 5.1   Thyroid Function Tests  Recent Labs  01/07/16 2154  TSH 3.152     ECG  NSR with TWI in inferolateral leads, ST elevation in inferior leads  Echocardiogram 10/26/2015  LV EF: 60% - 65%  ------------------------------------------------------------------- Indications: 441.00 Dissection of Aorta Aneurysm, any  site.  ------------------------------------------------------------------- History: PMH: Alcohol abuse. Dissection of descending now includes ascending aorta and great vessels. Stroke. Risk factors: Current tobacco use.  ------------------------------------------------------------------- Study Conclusions  - Left ventricle: The cavity size was normal. There was moderate  concentric hypertrophy. Systolic function was normal. The  estimated ejection fraction was in the range of 60% to 65%. Wall  motion was normal; there were no regional wall motion  abnormalities. There was an increased relative contribution of  atrial contraction to ventricular filling. Doppler parameters are  consistent with abnormal left ventricular relaxation (grade 1  diastolic dysfunction). - Aortic valve: Trileaflet; mildly thickened, mildly calcified  leaflets. There was trivial regurgitation. - Aorta: Aortic root dimension: 40 mm  (ED). Ascending aorta  diameter: 50 mm (ED). - Mitral valve: There was trivial regurgitation.     Radiology/Studies  Dg Chest 2 View  01/07/2016  CLINICAL DATA:  Shortness of breath. Chest pain. History of aortic dissection. EXAM: CHEST  2 VIEW COMPARISON:  Chest x-ray 12/02/2015.  Chest CT 10/25/2015. FINDINGS: Lingular atelectasis in the lingula. Right lung is clear. No effusions. Heart is normal size. Prior median sternotomy. No acute bony abnormality. IMPRESSION: Lingular atelectasis.  No active disease. Electronically Signed   By: Charlett Nose M.D.   On: 01/07/2016 12:26   Ct Angio Chest/abd/pel For Dissection W And/or W/wo  01/07/2016  CLINICAL DATA:  Chest pain and shortness of Breath, history of aortic dissection repair. EXAM: CT ANGIOGRAPHY CHEST, ABDOMEN AND PELVIS TECHNIQUE: Multidetector CT imaging through the chest, abdomen and pelvis was performed using the standard protocol during bolus administration of intravenous contrast. Multiplanar reconstructed images and MIPs were obtained and reviewed to evaluate the vascular anatomy. CONTRAST:  100 mL Isovue 370. COMPARISON:  10/25/2015 FINDINGS: CTA CHEST FINDINGS The lungs are well aerated bilaterally. Some mild periaortic atelectasis is noted in the left lower lobe. This is stable from the prior exam. No new focal infiltrate or effusion is seen. The thoracic inlet shows evidence of dissection flaps in the proximal portions of the left subclavian artery as well as within the proximal portion of the right subclavian and right common carotid artery. The 5 seen previously in the origin of the right innominate artery is not as well appreciated following the interval surgery. There are changes consistent with ascending aortic repair without recurrent dissection. There remains an intimal flap in the distal aspect of the thoracic aortic arch with origin of the left vertebral and left subclavian artery from the residual false lumen. This false lumen  extends only for few cm proximally. Second false lumen is noted in the descending thoracic aorta. This extends to the level of the aortic hiatus and into the abdominal aorta similar to that seen on the prior exam. No hilar or mediastinal adenopathy is noted. Changes of prior median sternotomy are noted. No acute bony abnormality is seen. Pulmonary artery is within normal limits. Review of the MIP images confirms the above findings. CTA ABDOMEN AND PELVIS FINDINGS The dissection flap extends inferiorly in the abdominal aorta and subsequently into the left common iliac artery. It does not extend inferiorly into the internal or external iliac arteries. There is a focal defect in the flap just above the origin of the left renal artery best seen on image number 110 of series 5. This allows perfusion of the false lumen. The celiac axis, superior mesenteric artery and dual right renal arteries arise from the true lumen. The left renal artery and inferior mesenteric artery arise from the  false lumen. Adequate perfusion of the iliac arteries is noted bilaterally. The liver, spleen, adrenal glands, pancreas and gallbladder are normal in their CT appearance. Kidneys demonstrate a normal enhancement pattern bilaterally. Staghorn calculus is noted in the lower pole of the right kidney measuring approximately 2.5 cm in greatest dimension. This was not as well appreciated on the prior exam due to the timing of the contrast bolus. The appendix is within normal limits. No significant diverticulitis is seen. The bladder is partially distended. No pelvic mass lesion is noted. The osseous structures show degenerative change of the lumbar spine and right hip joint. Review of the MIP images confirms the above findings. IMPRESSION: CTA of the chest: Status post repair of aortic dissection with widely patent ascending aortic graft. Dissection flaps are again identified within the proximal portions of the right common carotid artery, right  subclavian artery and proximal left subclavian artery. Persistent small false lumen at the distal aspect of the aortic arch with perfusion of the left vertebral artery and left subclavian artery from this outpouching. Second area of dissection in the descending thoracic aorta similar to that seen on the prior exam. It perfuse is via an intimal flap just above the left renal artery. Stable left basilar atelectasis CTA of the abdomen and pelvis: Continued dissection of the abdominal aorta with extension into the left common iliac artery. The overall appearance is similar to that seen on the prior CT examination. Per fusion of the visceral vessels is as described above with only the inferior mesenteric artery and left renal artery being perfuse by the false lumen. Right staghorn calculus. No other focal abnormality is seen. Electronically Signed   By: Alcide CleverMark  Lukens M.D.   On: 01/07/2016 15:11    ASSESSMENT AND PLAN  1. Dyspnea:   - repeat echo since he has not had a repeat echo since he had ST elevation during dissection repair  - stress test today. Continue on lasix, his orthopnea has improved.   - he has ST elevation in inferior leads, I reviewed the EKG with Dr. Donnie Ahoilley, no change in ST elevation since Feb. Serial trop negative. Denies any chest pain  2. HTN: BP high today, but he has not received his morning BP med  3. Hx of substance abuse: Patient denies any recent use of cocaine.    Signed, Azalee CourseMeng, Kimaria Struthers PA-C Pager: [phone]

## 2016-01-09 ENCOUNTER — Observation Stay (HOSPITAL_BASED_OUTPATIENT_CLINIC_OR_DEPARTMENT_OTHER): Payer: Medicaid Other

## 2016-01-09 DIAGNOSIS — R0600 Dyspnea, unspecified: Secondary | ICD-10-CM

## 2016-01-09 DIAGNOSIS — R079 Chest pain, unspecified: Secondary | ICD-10-CM | POA: Diagnosis not present

## 2016-01-09 DIAGNOSIS — I10 Essential (primary) hypertension: Secondary | ICD-10-CM | POA: Diagnosis not present

## 2016-01-09 DIAGNOSIS — I255 Ischemic cardiomyopathy: Secondary | ICD-10-CM | POA: Diagnosis not present

## 2016-01-09 HISTORY — PX: TRANSTHORACIC ECHOCARDIOGRAM: SHX275

## 2016-01-09 LAB — ECHOCARDIOGRAM COMPLETE
HEIGHTINCHES: 68 in
WEIGHTICAEL: 3667.2 [oz_av]

## 2016-01-09 MED ORDER — CARVEDILOL 3.125 MG PO TABS
3.1250 mg | ORAL_TABLET | Freq: Two times a day (BID) | ORAL | Status: DC
  Administered 2016-01-09 – 2016-01-10 (×2): 3.125 mg via ORAL
  Filled 2016-01-09 (×2): qty 1

## 2016-01-09 MED ORDER — SENNOSIDES-DOCUSATE SODIUM 8.6-50 MG PO TABS
1.0000 | ORAL_TABLET | Freq: Two times a day (BID) | ORAL | Status: DC | PRN
  Administered 2016-01-09: 1 via ORAL
  Filled 2016-01-09: qty 1

## 2016-01-09 NOTE — Progress Notes (Signed)
Patient refused bed alarm. Will continue to monitor.  

## 2016-01-09 NOTE — Progress Notes (Signed)
SUBJECTIVE:  No complaints  OBJECTIVE:   Vitals:   Filed Vitals:   01/08/16 1207 01/08/16 2100 01/09/16 0515 01/09/16 1214  BP: 140/78 138/77 152/88 133/80  Pulse: 103 84 89 80  Temp: 97.6 F (36.4 C) 98.2 F (36.8 C) 97.7 F (36.5 C) 98 F (36.7 C)  TempSrc: Oral Oral Oral Oral  Resp: Height:      Weight:   229 lb 3.2 oz (103.964 kg)   SpO2: 100% 100% 100% 100%   I&O's:   Intake/Output Summary (Last 24 hours) at 01/09/16 1426 Last data filed at 01/09/16 1300  Gross per 24 hour  Intake    580 ml  Output    325 ml  Net    255 ml   TELEMETRY: Reviewed telemetry pt in NSR:     PHYSICAL EXAM General: Well developed, well nourished, in no acute distress Head: Eyes PERRLA, No xanthomas.   Normal cephalic and atramatic  Lungs:   Clear bilaterally to auscultation and percussion. Heart:   HRRR S1 S2 Pulses are 2+ & equal. Abdomen: Bowel sounds are positive, abdomen soft and non-tender without masses  Extremities:   No clubbing, cyanosis or edema.  DP +1 Neuro: Alert and oriented X 3. Psych:  Good affect, responds appropriately   LABS: Basic Metabolic Panel:  Recent Labs  04/54/09 1204 01/07/16 2155 01/08/16 1359  NA 139  --  138  K 4.7  --  3.7  CL 107  --  104  CO2 23  --  23  GLUCOSE 97  --  104*  BUN 23*  --  20  CREATININE 1.02 1.13 1.20  CALCIUM 9.2  --  8.9   Liver Function Tests:  Recent Labs  01/07/16 2155  AST 16  ALT 14*  ALKPHOS 91  BILITOT 0.4  PROT 7.6  ALBUMIN 3.5   No results for input(s): LIPASE, AMYLASE in the last 72 hours. CBC:  Recent Labs  01/07/16 2155 01/08/16 1359  WBC 6.4 6.7  HGB 13.4 14.1  HCT 43.0 45.6  MCV 77.8* 78.5  PLT 308 362   Cardiac Enzymes:  Recent Labs  01/07/16 2155 01/08/16 0243 01/08/16 1359  TROPONINI 0.03 0.03 <0.03   BNP: Invalid input(s): POCBNP D-Dimer: No results for input(s): DDIMER in the last 72 hours. Hemoglobin A1C: No results for input(s): HGBA1C in the last  72 hours. Fasting Lipid Panel:  Recent Labs  01/08/16 0243  CHOL 164  HDL 32*  LDLCALC 112*  TRIG 102  CHOLHDL 5.1   Thyroid Function Tests:  Recent Labs  01/07/16 2154  TSH 3.152   Anemia Panel: No results for input(s): VITAMINB12, FOLATE, FERRITIN, TIBC, IRON, RETICCTPCT in the last 72 hours. Coag Panel:   Lab Results  Component Value Date   INR 1.45 10/29/2015   INR 1.66* 10/28/2015   INR 1.19 10/26/2015   INR 1.23 10/26/2015    RADIOLOGY: Dg Chest 2 View  01/07/2016  CLINICAL DATA:  Shortness of breath. Chest pain. History of aortic dissection. EXAM: CHEST  2 VIEW COMPARISON:  Chest x-ray 12/02/2015.  Chest CT 10/25/2015. FINDINGS: Lingular atelectasis in the lingula. Right lung is clear. No effusions. Heart is normal size. Prior median sternotomy. No acute bony abnormality. IMPRESSION: Lingular atelectasis.  No active disease. Electronically Signed   By: Charlett Nose M.D.   On: 01/07/2016 12:26   Nm Myocar Multi W/spect W/wall Motion / Ef  01/08/2016  CLINICAL DATA:  Short of  breath EXAM: MYOCARDIAL IMAGING WITH SPECT (REST AND PHARMACOLOGIC-STRESS) GATED LEFT VENTRICULAR WALL MOTION STUDY LEFT VENTRICULAR EJECTION FRACTION TECHNIQUE: Standard myocardial SPECT imaging was performed after resting intravenous injection of 10 mCi [id number] sestamibi. Subsequently, intravenous infusion of Lexiscan was performed under the supervision of the Cardiology staff. At peak effect of the drug, 30 mCi [id number] sestamibi was injected intravenously and standard myocardial SPECT imaging was performed. Quantitative gated imaging was also performed to evaluate left ventricular wall motion, and estimate left ventricular ejection fraction. COMPARISON:  None. FINDINGS: Perfusion: There is a large fixed perfusion defect involving the inferior and lateral walls. Wall Motion: There is global hypokinesis Left Ventricular Ejection Fraction: 29 % End diastolic volume 112 ml End systolic volume 79 ml  IMPRESSION: 1. Large fixed defect involving the inferior and lateral walls. 2. Global hypokinesis. 3. Left ventricular ejection fraction 29% 4. High-risk stress test findings*. *2012 Appropriate Use Criteria for Coronary Revascularization Focused Update: J Am Coll Cardiol. 2012;59(9):857-881. http://content.dementiazones.comonlinejacc.org/article.aspx?articleid=1201161 Electronically Signed   By: Jolaine ClickArthur  Hoss M.D.   On: 01/08/2016 13:02   Ct Angio Chest/abd/pel For Dissection W And/or W/wo  01/07/2016  CLINICAL DATA:  Chest pain and shortness of Breath, history of aortic dissection repair. EXAM: CT ANGIOGRAPHY CHEST, ABDOMEN AND PELVIS TECHNIQUE: Multidetector CT imaging through the chest, abdomen and pelvis was performed using the standard protocol during bolus administration of intravenous contrast. Multiplanar reconstructed images and MIPs were obtained and reviewed to evaluate the vascular anatomy. CONTRAST:  100 mL Isovue 370. COMPARISON:  10/25/2015 FINDINGS: CTA CHEST FINDINGS The lungs are well aerated bilaterally. Some mild periaortic atelectasis is noted in the left lower lobe. This is stable from the prior exam. No new focal infiltrate or effusion is seen. The thoracic inlet shows evidence of dissection flaps in the proximal portions of the left subclavian artery as well as within the proximal portion of the right subclavian and right common carotid artery. The 5 seen previously in the origin of the right innominate artery is not as well appreciated following the interval surgery. There are changes consistent with ascending aortic repair without recurrent dissection. There remains an intimal flap in the distal aspect of the thoracic aortic arch with origin of the left vertebral and left subclavian artery from the residual false lumen. This false lumen extends only for few cm proximally. Second false lumen is noted in the descending thoracic aorta. This extends to the level of the aortic hiatus and into the abdominal  aorta similar to that seen on the prior exam. No hilar or mediastinal adenopathy is noted. Changes of prior median sternotomy are noted. No acute bony abnormality is seen. Pulmonary artery is within normal limits. Review of the MIP images confirms the above findings. CTA ABDOMEN AND PELVIS FINDINGS The dissection flap extends inferiorly in the abdominal aorta and subsequently into the left common iliac artery. It does not extend inferiorly into the internal or external iliac arteries. There is a focal defect in the flap just above the origin of the left renal artery best seen on image number 110 of series 5. This allows perfusion of the false lumen. The celiac axis, superior mesenteric artery and dual right renal arteries arise from the true lumen. The left renal artery and inferior mesenteric artery arise from the false lumen. Adequate perfusion of the iliac arteries is noted bilaterally. The liver, spleen, adrenal glands, pancreas and gallbladder are normal in their CT appearance. Kidneys demonstrate a normal enhancement pattern bilaterally. Staghorn calculus is noted in the  lower pole of the right kidney measuring approximately 2.5 cm in greatest dimension. This was not as well appreciated on the prior exam due to the timing of the contrast bolus. The appendix is within normal limits. No significant diverticulitis is seen. The bladder is partially distended. No pelvic mass lesion is noted. The osseous structures show degenerative change of the lumbar spine and right hip joint. Review of the MIP images confirms the above findings. IMPRESSION: CTA of the chest: Status post repair of aortic dissection with widely patent ascending aortic graft. Dissection flaps are again identified within the proximal portions of the right common carotid artery, right subclavian artery and proximal left subclavian artery. Persistent small false lumen at the distal aspect of the aortic arch with perfusion of the left vertebral artery  and left subclavian artery from this outpouching. Second area of dissection in the descending thoracic aorta similar to that seen on the prior exam. It perfuse is via an intimal flap just above the left renal artery. Stable left basilar atelectasis CTA of the abdomen and pelvis: Continued dissection of the abdominal aorta with extension into the left common iliac artery. The overall appearance is similar to that seen on the prior CT examination. Per fusion of the visceral vessels is as described above with only the inferior mesenteric artery and left renal artery being perfuse by the false lumen. Right staghorn calculus. No other focal abnormality is seen. Electronically Signed   By: Alcide Clever M.D.   On: 01/07/2016 15:11    ASSESSMENT AND PLAN  1. Dyspnea:  - echo today showed mild LV dysfunction with EF 40-45% with moderate LVH and grade 1DD, mild AR and inferior infarct.  Since last echo EF has declined. - stress test yesterday showed large fixed defect involving the inferior and lateral walls with no ischemia and EF 29% but by echo is 40-45%. Stress test read as high risk only due to low EF that was understimated based on echo findings.  - he had ST elevation in inferior leads in February during his dissection repair felt secondary to air embolism in the RCA or dissection flap closing the RCA temporarily.  He did not have any further evaluation since then.  EKG shows no change in ST elevation since Feb. Serial trop negative. Denies any chest pain.  At this time there is no ischemia on nuclear stress test so would not pursue cath at this time.    - Continue PO lasix and add low dose Coreg.   2. HTN: BP improved.  Continue Clonidine/ARB  3. Hx of substance abuse: Patient denies any recent use of cocaine.   4.  Acute on chronic combined systolic/diastolic CHF NYHA class II.  BNP minimally elevated.  He is net neg -514cc.  Continue ARB.  Add Coreg 3.125mg  BID.   Continue Lasix.    Quintella Reichert, MD  01/09/2016  2:26 PM

## 2016-01-10 ENCOUNTER — Ambulatory Visit: Payer: Self-pay

## 2016-01-10 DIAGNOSIS — I480 Paroxysmal atrial fibrillation: Secondary | ICD-10-CM | POA: Insufficient documentation

## 2016-01-10 DIAGNOSIS — I5023 Acute on chronic systolic (congestive) heart failure: Secondary | ICD-10-CM | POA: Diagnosis not present

## 2016-01-10 DIAGNOSIS — I255 Ischemic cardiomyopathy: Secondary | ICD-10-CM | POA: Diagnosis present

## 2016-01-10 DIAGNOSIS — I10 Essential (primary) hypertension: Secondary | ICD-10-CM | POA: Diagnosis present

## 2016-01-10 MED ORDER — POTASSIUM CHLORIDE CRYS ER 20 MEQ PO TBCR: 20.0000 meq | EXTENDED_RELEASE_TABLET | Freq: Every day | ORAL | Status: DC

## 2016-01-10 MED ORDER — ATORVASTATIN CALCIUM 40 MG PO TABS: 40.0000 mg | ORAL_TABLET | Freq: Every day | ORAL | Status: DC

## 2016-01-10 MED ORDER — CARVEDILOL 6.25 MG PO TABS: 6.2500 mg | ORAL_TABLET | Freq: Two times a day (BID) | ORAL | Status: DC

## 2016-01-10 MED ORDER — LOSARTAN POTASSIUM 50 MG PO TABS: 50.0000 mg | ORAL_TABLET | Freq: Every day | ORAL | Status: DC

## 2016-01-10 MED ORDER — NITROGLYCERIN 0.4 MG SL SUBL: 0.4000 mg | SUBLINGUAL_TABLET | SUBLINGUAL | Status: AC | PRN

## 2016-01-10 MED ORDER — ASPIRIN 81 MG PO TBEC: 81.0000 mg | DELAYED_RELEASE_TABLET | Freq: Every day | ORAL | Status: DC

## 2016-01-10 MED ORDER — ACETAMINOPHEN 325 MG PO TABS: 650.0000 mg | ORAL_TABLET | ORAL | Status: DC | PRN

## 2016-01-10 MED ORDER — FUROSEMIDE 40 MG PO TABS: 40.0000 mg | ORAL_TABLET | Freq: Every day | ORAL | Status: DC

## 2016-01-10 MED ORDER — CARVEDILOL 3.125 MG PO TABS: 3.1250 mg | ORAL_TABLET | Freq: Two times a day (BID) | ORAL | Status: DC

## 2016-01-10 MED FILL — NITROSTAT 0.4 MG TABLET SL: 0.4 | 7 days supply | Qty: 25 | Fill #0

## 2016-01-10 MED FILL — CARVEDILOL 3.125 MG TABLET: 3.125 | 30 days supply | Qty: 60 | Fill #0

## 2016-01-10 MED FILL — LOSARTAN POTASSIUM 50 MG TA: 50 | 30 days supply | Qty: 30 | Fill #0 | Status: TO

## 2016-01-10 MED FILL — ATORVASTATIN 40 MG TABLET: 40 | 30 days supply | Qty: 30 | Fill #0 | Status: TO

## 2016-01-10 NOTE — Progress Notes (Signed)
SUBJECTIVE:  No complaints  OBJECTIVE:   Vitals:   Filed Vitals:   01/09/16 0515 01/09/16 1214 01/09/16 2253 01/10/16 0616  BP: 152/88 133/80 146/81 131/84  Pulse: 89 80 85 76  Temp: 97.7 F (36.5 C) 98 F (36.7 C) 98.1 F (36.7 C) 97.9 F (36.6 C)  TempSrc: Oral Oral Oral Oral  Resp: 18 18 17 18   Height:      Weight: 229 lb 3.2 oz (103.964 kg)   230 lb 8 oz (104.554 kg)  SpO2: 100% 100% 98% 100%   I&O's:    Intake/Output Summary (Last 24 hours) at 01/10/16 16100807 Last data filed at 01/10/16 0101  Gross per 24 hour  Intake   1060 ml  Output    600 ml  Net    460 ml   TELEMETRY: Reviewed telemetry pt in NSR:     PHYSICAL EXAM General: Well developed, well nourished, in no acute distress Head: Eyes PERRLA, No xanthomas.   Normal cephalic and atramatic  Lungs:   Clear bilaterally to auscultation and percussion. Heart:   HRRR S1 S2 Pulses are 2+ & equal. Abdomen: Bowel sounds are positive, abdomen soft and non-tender without masses  Extremities:   No clubbing, cyanosis or edema.  DP +1 Neuro: Alert and oriented X 3. Psych:  Good affect, responds appropriately   LABS: Basic Metabolic Panel:  Recent Labs  96/12/5402/28/17 1204 01/07/16 2155 01/08/16 1359  NA 139  --  138  K 4.7  --  3.7  CL 107  --  104  CO2 23  --  23  GLUCOSE 97  --  104*  BUN 23*  --  20  CREATININE 1.02 1.13 1.20  CALCIUM 9.2  --  8.9   Liver Function Tests:  Recent Labs  01/07/16 2155  AST 16  ALT 14*  ALKPHOS 91  BILITOT 0.4  PROT 7.6  ALBUMIN 3.5   No results for input(s): LIPASE, AMYLASE in the last 72 hours. CBC:  Recent Labs  01/07/16 2155 01/08/16 1359  WBC 6.4 6.7  HGB 13.4 14.1  HCT 43.0 45.6  MCV 77.8* 78.5  PLT 308 362   Cardiac Enzymes:  Recent Labs  01/07/16 2155 01/08/16 0243 01/08/16 1359  TROPONINI 0.03 0.03 <0.03     Recent Labs  01/08/16 0243  CHOL 164  HDL 32*  LDLCALC 112*  TRIG 102  CHOLHDL 5.1   Thyroid Function Tests:  Recent  Labs  01/07/16 2154  TSH 3.152    RADIOLOGY:  Myoview 01/08/16: IMPRESSION: 1. Large fixed defect involving the inferior and lateral walls.  2. Global hypokinesis.  3. Left ventricular ejection fraction 29%  4. High-risk stress test findings*.  Chest CTA 01/07/16 IMPRESSION: CTA of the chest: Status post repair of aortic dissection with widely patent ascending aortic graft. Dissection flaps are again identified within the proximal portions of the right common carotid artery, right subclavian artery and proximal left subclavian artery.  Persistent small false lumen at the distal aspect of the aortic arch with perfusion of the left vertebral artery and left subclavian artery from this outpouching.  Second area of dissection in the descending thoracic aorta similar to that seen on the prior exam. It perfuse is via an intimal flap just above the left renal artery.  Stable left basilar atelectasis  Echo 01/09/16 Study Conclusions  - Left ventricle: The cavity size was normal. There was moderate  concentric hypertrophy. Systolic function was mildly to  moderately reduced. The estimated  ejection fraction was in the  range of 40% to 45%. Diffuse hypokinesis. There is akinesis of  the basal-mid myocardium. Doppler parameters are consistent with  abnormal left ventricular relaxation (grade 1 diastolic  dysfunction). - Aortic valve: Trileaflet; mildly thickened, mildly calcified  leaflets. There was mild regurgitation.  Impressions:  - Inferior infarct is new when compared to prior. EF reduced  (previously 55%).    ASSESSMENT AND PLAN  55 yo male with PMH of malignant HTN/CVA/s/p Thoracic aortic dissection repair 10/28/2015 by Dr. Donata Clay presented to the [facility] ED with orthopnea, PND and fatigue for the past 3 days.  1. Dyspnea:  - echo showed mild LV dysfunction with EF 40-45% with moderate LVH and grade 1DD, mild AR and inferior infarct.   Since last echo EF has declined. - stress Myoview showed large fixed defect involving the inferior and lateral walls with no ischemia and EF 29% but by echo is 40-45%. Stress test read as high risk only due to low EF that was understimated based on echo findings.  - he had ST elevation in inferior leads in February during his dissection repair felt secondary to air embolism in the RCA or dissection flap closing the RCA temporarily.  He did not have any further evaluation since then.  EKG shows no change in ST elevation since Feb. Serial trop negative. Denies any chest pain.  At this time there is no ischemia on nuclear stress test so would not pursue cath at this time.    2. HTN: BP improved.  Continue Clonidine/ARB/Coreg  3. Hx of substance abuse: Patient denies any recent use of cocaine.   4.  Acute on chronic combined systolic/diastolic CHF NYHA class II.  BNP minimally elevated.  He is net neg -514cc, wgt down 5 lbs.  Continue Lasix at discharge.  Plan: Possible discharge today - will review medications with MD.  He'll need a f/u in 1-2 weeks as an OP with a BMP then. He see's Dr Morton Peters on 5/24.    Abelino Derrick, PA-C  01/10/2016  8:07 AM  Patient seen, examined. Available data reviewed. Agree with findings, assessment, and plan as outlined by Corine Shelter, PA-C. Exam reveals an alert, oriented male in no distress. Lungs are clear. Heart is regular rate and rhythm. Jugular venous pressure is normal. There is no peripheral edema. Lab data, radiographic data, and notes reviewed. Myocardial perfusion scan, EKG, and clinical history are all consistent with an inferior MI that likely occurred perioperatively. There is no significant ischemia seen. Blood pressure remains mildly elevated and would increase carvedilol to 6.25 mg twice daily. Otherwise would continue current medicines which include Lasix 40 mg daily. Will arrange outpatient follow-up. Patient is stable for  discharge today.  Tonny Bollman, M.D. 01/10/2016 11:21 AM

## 2016-01-10 NOTE — Discharge Instructions (Signed)

## 2016-01-10 NOTE — Discharge Summary (Signed)
Patient ID: [name],  MRN: [id number], DOB/AGE: 55/03/1961 [age] y.o.  Admit date: 01/07/2016 Discharge date: 01/10/2016  Primary Care Provider: Lora PaulaFUNCHES, JOSALYN C, MD Primary Cardiologist: Dr Herbie BaltimoreHarding (new)  Discharge Diagnoses Principal Problem:   Dyspnea Active Problems:   Pain in the chest   Uncontrolled hypertension   Essential hypertension   Aortic dissection, thoracic- surgery Feb 2017   Cardiomyopathy, ischemic-40-45%   Cocaine abuse-Feb 2017   Known aortic dissection, thoracoabdominal April 2016   History of CVA -embolic Feb 2017    Procedures: Echo 01/09/16                        Myoview 01/08/16   Hospital Course;  55 yo male with PMH of malignant HTN/CVA/s/p Thoracic aortic dissection repair 10/28/2015 in setting of cocaine abuse. He actually has had a prior documented thoracic abdominal dissection in April 2016. He presented in Feb 2017 with an acute embolic Rt brain CVA.  [facility] with orthopnea, PND and fatigue for the previous 3 days and was noted to be hypertensive. There was concern he had acute diastolic failure but this was not evident by CXR, exam, or mildly elevated BNP. He also had chest pain on admission but Troponin were negative. Echo revealed an EF of 40-45%. Myoview revealed inferior scar felt to be secondary to an embolic RCA infarct at the time of his surgery in Feb. His medications were adjusted. Lasix was added. He is felt to be stable for discharge 01/10/16. He'll need a BMP at f/u.   Discharge Vitals:  Blood pressure 131/84, pulse 76, temperature 97.9 F (36.6 C), temperature source Oral, resp. rate 18, height 5\' 8"  (1.727 m), weight 230 lb 8 oz (104.554 kg), SpO2 100 %.    Labs: No results found for this or any previous visit (from the past 24 hour(s)).  Disposition:      Follow-up Information    Follow up with Norma FredricksonLORI GERHARDT, NP. Go on 01/17/2016.   Specialties:  Nurse Practitioner, Interventional  Cardiology, Cardiology, Radiology   Why:  @3 :30pm   Contact information:   [address] [place] [address] [phone]       Discharge Medications:    Medication List    STOP taking these medications        magnesium citrate Soln      TAKE these medications        acetaminophen 325 MG tablet  Commonly known as:  TYLENOL  Take 2 tablets (650 mg total) by mouth every 4 (four) hours as needed for headache or mild pain.     aspirin 81 MG EC tablet  Take 1 tablet (81 mg total) by mouth daily.     atorvastatin 40 MG tablet  Commonly known as:  LIPITOR  Take 1 tablet (40 mg total) by mouth daily at 6 PM.     carvedilol 6.25 MG tablet  Commonly known as:  COREG  Take 1 tablet (6.25 mg total) by mouth 2 (two) times daily with a meal.     cetirizine 10 MG tablet  Commonly known as:  ZYRTEC  Take 1 tablet (10 mg total) by mouth daily.     cloNIDine 0.2 MG tablet  Commonly known as:  CATAPRES  Take 1 tablet (0.2 mg total) by mouth 2 (two) times daily.     fluticasone 50 MCG/ACT nasal spray  Commonly known as:  FLONASE  Place 2 sprays into both nostrils daily.     furosemide 40 MG tablet  Commonly known as:  LASIX  Take 1 tablet (40 mg total) by mouth daily.     losartan 50 MG tablet  Commonly known as:  COZAAR  Take 1 tablet (50 mg total) by mouth daily.     nitroGLYCERIN 0.4 MG SL tablet  Commonly known as:  NITROSTAT  Place 1 tablet (0.4 mg total) under the tongue every 5 (five) minutes x 3 doses as needed for chest pain.     polyethylene glycol packet  Commonly known as:  MIRALAX / GLYCOLAX  Take 17 g by mouth daily as needed.     potassium chloride SA 20 MEQ tablet  Commonly known as:  K-DUR,KLOR-CON  Take 1 tablet (20 mEq total) by mouth daily.     traZODone 50 MG tablet  Commonly known as:  DESYREL  Take 0.5-1 tablets (25-50 mg total) by mouth at bedtime as needed for sleep.         Duration of Discharge Encounter: Greater than  30 minutes including physician time.  Jolene Provost PA-C 01/10/2016 11:31 AM

## 2016-01-11 ENCOUNTER — Telehealth: Payer: Self-pay | Admitting: Cardiology

## 2016-01-11 ENCOUNTER — Telehealth: Payer: Self-pay | Admitting: *Deleted

## 2016-01-11 ENCOUNTER — Telehealth: Payer: Self-pay | Admitting: Family Medicine

## 2016-01-11 NOTE — Telephone Encounter (Signed)
Closed encounter °

## 2016-01-11 NOTE — Telephone Encounter (Signed)
[name] called about question for his inhaler and why it was prescribed.  I explained to him that because he was admitted to inpt rehab during his hospitalization that [name] PA wrote for it at discharge but we did not originally put him on the medication and this would need to be discussed with his primary care about his lung issues he is having. We have to write at discharge for whatever he was taking in hospital but we do not follow him for those indications.

## 2016-01-11 NOTE — Telephone Encounter (Signed)
Mr. [name] just came home from the hospital and when he get home he is having shortness of breath . Please call   Thanks

## 2016-01-11 NOTE — Telephone Encounter (Signed)
Patients has a few concerns  He states his surgeon canceled all his medicines except the medication BP....3/28  He states he feels that he needs his inhaler....  Patient had an episode in which he was experiencing shortness of breath...  Please follow up with patient

## 2016-01-11 NOTE — Telephone Encounter (Signed)
SPOKE TO PATIENT PATIENT STATES HE HAD ANOTHER EPISODE LAST NIGHT. HE STATES HE DID NOT HAVE A BREATHING EPISODES WHILE IN THE HOSPITAL PATIENT STATES HIS INHALERS ERE DISCONTINUE WHEN HE SAW DR VAN TRIGT. HE WAS WONDERING IF HE NEEDS THE INHALER BACK ,SINCE HE USED A DOSE THIS MORNING AND HIS BREATHING HAS GOTTEN BETTER. PATIENT ALSO WANTED TO KNOW WHAT HIS TEST SHOWED THAT WERE DONE AT THE HOSPITAL.  RN REVIEWED INFORMATION WITH PATIENT. INFORMED PATIENT IF ISSUE WAS NOT HEART RELATED, THE NEXT STEP IS TO CONTACT HIS PRIMARY - DR Armen PickupFUNCHES TO RESTATE INHALERS PATIENT HAD ANOTHER QUESTION CONCERNING -WHAT DOSE IS THE CARVEDILOL  SHOULD BE? PER DISCHARGE INFORMATION --- PATIENT SHOULD BE TAKING 6.25 MG TWICE A DAY PATIENT STATES THE PHARMACY GAVE HIM A PRESCRIPTION FOR 3.125 MG B.I.D. PATIENT STATES HE WILL CALL PHARMACY BACK AN INFORMED THEM SINCE THEY HAD ASKED PATIENT THE DAY BEFORE.  RN WENT OVER UPCOMING APPOINTMENT  PATIENT VERBALIZED UNDERSTANDING ALL THE ABOVE.

## 2016-01-12 NOTE — Telephone Encounter (Signed)
[name] from [facility] called requesting verbal orders to continue having skilled nursing for the pt. And also to continue therapy. She has to see the pt. On Friday. Please f/u ASAP

## 2016-01-13 NOTE — Telephone Encounter (Signed)
Please call patient, he was not prescribed inhalers. The only med that was discontinued was mag citrate for constipation  I [place] back to [place].  Gave VO to continue home health services

## 2016-01-15 ENCOUNTER — Encounter: Payer: Self-pay | Admitting: Cardiology

## 2016-01-17 ENCOUNTER — Encounter: Payer: Self-pay | Admitting: Nurse Practitioner

## 2016-01-17 ENCOUNTER — Ambulatory Visit: Payer: Self-pay

## 2016-01-17 ENCOUNTER — Ambulatory Visit (INDEPENDENT_AMBULATORY_CARE_PROVIDER_SITE_OTHER): Payer: Medicaid Other | Admitting: Nurse Practitioner

## 2016-01-17 VITALS — BP 130/70 | HR 96 | Ht 68.0 in | Wt 241.6 lb

## 2016-01-17 DIAGNOSIS — I5022 Chronic systolic (congestive) heart failure: Secondary | ICD-10-CM | POA: Diagnosis not present

## 2016-01-17 MED ORDER — CARVEDILOL 6.25 MG PO TABS: 9.7500 mg | ORAL_TABLET | Freq: Two times a day (BID) | ORAL | Status: DC

## 2016-01-17 NOTE — Progress Notes (Addendum)
CARDIOLOGY OFFICE NOTE  Date:  01/17/2016    [name] Date of Birth: 04/13/1961 Medical Record #[id number]  PCP:  Lora PaulaFUNCHES, JOSALYN C, MD  Cardiologist:  Herbie BaltimoreHarding    Chief Complaint  Patient presents with  . Hypertension  . Coronary Artery Disease    Post hospital visit. Seen for Dr. Herbie BaltimoreHarding    History of Present Illness: Frank PavlovDwayne A Crotteau is a 55 y.o. male who presents today for a post hospital visit. Seen for Dr. Herbie BaltimoreHarding.   He has a history of malignant HTN/CVA/s/p Thoracic aortic dissection repair of Stanford type A dissection of the ascending aorta with a Hemashield straight graft, hemi-arch replacement and resuspension of the aortic valve under hypothermic circulatory arrest 10/28/2015 in setting of cocaine abuse. He actually has had a prior documented thoracic abdominal dissection in April 2016. He presented in Feb 2017 with an acute embolic Rt brain CVA.   [facility] with orthopnea, PND and fatigue for the previous 3 days and was noted to be hypertensive. There was concern he had acute diastolic failure but this was not evident by CXR, exam, or mildly elevated BNP. He also had chest pain on admission but Troponins were negative. Echo revealed an EF of 40-45%. Myoview revealed inferior scar felt to be secondary to an embolic RCA infarct at the time of his surgery in Feb with no signs of ischemia. His medications were adjusted. Cardiac cath was not felt to be indicated. Lasix was added.   Comes back today. Here alone today. Says he is doing "ok". He has had some spells of abrupt shortness of breath - he started his Symbicort back on his own with improvement. He has no swelling. Not weighing at home. Not clear if he is really watching his salt as closely as he should be. His medicines are "all screwed up". Says the pharmacy told him to double up on his medicines?? [facility] fills [facility] pill box [facility]. He does not really know what he is taking. Says he  has stopped smoking. Says he is not using cocaine. Is still drinking beer. Not dizzy or lightheaded.   Past Medical History  Diagnosis Date  . Malignant hypertension Dx 2016  . Aortic dissection, thoracic (HCC) 10/28/2015  . CVA (cerebral infarction) 10/2015  . History of acute inferior wall MI 10/2015    Peri-Operative Inferior STEMI, presumably related to air embolism. -- Inferior Akinesis on Echo & Large Inferior-Inferolateral Infarct on Myoview  . Ischemic cardiomyopathy 10/2015    EF now 40-45% by Echo (~29% by Myoview)     Past Surgical History  Procedure Laterality Date  . Rectal abscess    . Thoracic aortic aneurysm repair N/A 10/28/2015    Procedure: TYPE A AORTIC DISSECTION REPAIR WITH CIRC ARREST;  Surgeon: Kerin PernaPeter Van Trigt, MD;  [facility];  Service: Open Heart Surgery;  Laterality: N/A;  . Tee without cardioversion N/A 10/28/2015    Procedure: TRANSESOPHAGEAL ECHOCARDIOGRAM (TEE);  Surgeon: Kerin PernaPeter Van Trigt, MD;  [facility];  Service: Open Heart Surgery;  Laterality: N/A;  . Transthoracic echocardiogram  01/09/2016    ef 40-45%, Mod Conc LVH. diffuse HK with AKinesis of basal-mid inferior wall. Gr 1 DD.  - c/w prior Inferior MI  . Nm myoview ltd  01/08/2016    (~EF 29 %), LARGE FIXED INFERIO-INFEROLATERAL WALL c/s prior Inferior MI. - Read as HIGH RISK due to LOW EF & Large Infarct  Medications: Current Outpatient Prescriptions  Medication Sig Dispense Refill  . acetaminophen (TYLENOL) 325 MG tablet Take 2 tablets (650 mg total) by mouth every 4 (four) hours as needed for headache or mild pain.    Marland Kitchen aspirin EC 81 MG EC tablet Take 1 tablet (81 mg total) by mouth daily.    Marland Kitchen atorvastatin (LIPITOR) 40 MG tablet Take 1 tablet (40 mg total) by mouth daily at 6 PM. 90 tablet 3  . carvedilol (COREG) 6.25 MG tablet Take 1.5 tablets (9.375 mg total) by mouth 2 (two) times daily with a meal. 90 tablet 11  . cetirizine (ZYRTEC) 10 MG tablet Take 1 tablet (10 mg total) by mouth  daily. 30 tablet 11  . cloNIDine (CATAPRES) 0.2 MG tablet Take 1 tablet (0.2 mg total) by mouth 2 (two) times daily. 60 tablet 1  . fluticasone (FLONASE) 50 MCG/ACT nasal spray Place 2 sprays into both nostrils daily. 16 g 6  . furosemide (LASIX) 40 MG tablet Take 1 tablet (40 mg total) by mouth daily. 30 tablet 11  . losartan (COZAAR) 50 MG tablet Take 1 tablet (50 mg total) by mouth daily. 30 tablet 11  . nitroGLYCERIN (NITROSTAT) 0.4 MG SL tablet Place 1 tablet (0.4 mg total) under the tongue every 5 (five) minutes x 3 doses as needed for chest pain. 25 tablet 2  . polyethylene glycol (MIRALAX / GLYCOLAX) packet Take 17 g by mouth daily as needed. 30 each 5  . potassium chloride SA (K-DUR,KLOR-CON) 20 MEQ tablet Take 1 tablet (20 mEq total) by mouth daily. 30 tablet 11  . traZODone (DESYREL) 50 MG tablet Take 0.5-1 tablets (25-50 mg total) by mouth at bedtime as needed for sleep. 30 tablet 3  . SYMBICORT 160-4.5 MCG/ACT inhaler   12   No current facility-administered medications for this visit.    Allergies: No Known Allergies  Social History: The patient  reports that he quit smoking about 2 months ago. His smoking use included Cigarettes. He does not have any smokeless tobacco history on file. He reports that he does not drink alcohol or use illicit drugs.   Family History: The patient's family history includes Dementia in his father; Diabetes in his father; Heart disease in his father.   Review of Systems: Please see the history of present illness.   Otherwise, the review of systems is positive for none.   All other systems are reviewed and negative.   Physical Exam: VS:  BP 130/70 mmHg  Pulse 96  Ht  (1.727 m)  Wt 241 lb 9.6 oz (109.589 kg)  BMI 36.74 kg/m2 .  BMI Body mass index is 36.74 kg/(m^2).  Wt Readings from Last 3 Encounters:  01/17/16 241 lb 9.6 oz (109.589 kg)  01/10/16 230 lb 8 oz (104.554 kg)  01/07/16 232 lb (105.235 kg)    General: Black male who is  alert and in no acute distress. His weight has climbed 11 pounds since discharge.  HEENT: Normal. Neck: Supple, no JVD, carotid bruits, or masses noted.  Cardiac: Regular rate and rhythm. Probable gallop noted on exam. No edema.  Respiratory:  Lungs are clear to auscultation bilaterally with normal work of breathing.  GI: Soft and nontender.  MS: No deformity or atrophy. Gait and ROM intact. Skin: Warm and dry. Color is normal.  Neuro:  Strength and sensation are intact and no gross focal deficits noted.  Psych: Alert, appropriate and with normal affect.   LABORATORY DATA:  EKG:  EKG  is not ordered today.  Lab Results  Component Value Date   WBC 6.7 01/08/2016   HGB 14.1 01/08/2016   HCT 45.6 01/08/2016   PLT 362 01/08/2016   GLUCOSE 104* 01/08/2016   CHOL 164 01/08/2016   TRIG 102 01/08/2016   HDL 32* 01/08/2016   LDLCALC 112* 01/08/2016   ALT 14* 01/07/2016   AST 16 01/07/2016   NA 138 01/08/2016   K 3.7 01/08/2016   CL 104 01/08/2016   CREATININE 1.20 01/08/2016   BUN 20 01/08/2016   CO2 23 01/08/2016   TSH 3.152 01/07/2016   INR 1.45 10/29/2015   HGBA1C 5.8* 10/27/2015    BNP (last 3 results)  Recent Labs  01/07/16 1329  BNP 122.7*    ProBNP (last 3 results)  Recent Labs  01/06/16 1753  PROBNP 1102.00*     Other Studies Reviewed Today: MYOCARDIAL IMAGING WITH SPECT (REST AND PHARMACOLOGIC-STRESS)  GATED LEFT VENTRICULAR WALL MOTION STUDY  LEFT VENTRICULAR EJECTION FRACTION  TECHNIQUE: Standard myocardial SPECT imaging was performed after resting intravenous injection of 10 mCi Tc-35m sestamibi. Subsequently, intravenous infusion of Lexiscan was performed under the supervision of the Cardiology staff. At peak effect of the drug, 30 mCi Tc-58m sestamibi was injected intravenously and standard myocardial SPECT imaging was performed. Quantitative gated imaging was also performed to evaluate left ventricular wall motion, and estimate  left ventricular ejection fraction.  COMPARISON: None.  FINDINGS: Perfusion: There is a large fixed perfusion defect involving the inferior and lateral walls.  Wall Motion: There is global hypokinesis  Left Ventricular Ejection Fraction: 29 %  End diastolic volume 112 ml  End systolic volume 79 ml  IMPRESSION: 1. Large fixed defect involving the inferior and lateral walls.  2. Global hypokinesis.  3. Left ventricular ejection fraction 29%  4. High-risk stress test findings*.  *2012 Appropriate Use Criteria for Coronary Revascularization Focused Update: J Am Coll Cardiol. 2012;59(9):857-881. http://content.dementiazones.com.aspx?articleid=1201161   Electronically Signed  By: Jolaine Click M.D.  On: 01/08/2016 13:02    Echo Study Conclusions from 12/2015  - Left ventricle: The cavity size was normal. There was moderate  concentric hypertrophy. Systolic function was mildly to  moderately reduced. The estimated ejection fraction was in the  range of 40% to 45%. Diffuse hypokinesis. There is akinesis of  the basal-mid myocardium. Doppler parameters are consistent with  abnormal left ventricular relaxation (grade 1 diastolic  dysfunction). - Aortic valve: Trileaflet; mildly thickened, mildly calcified  leaflets. There was mild regurgitation.  Impressions:  - Inferior infarct is new when compared to prior. EF reduced  (previously 55%).  Assessment/Plan: 1. Dyspnea:  - echo showed mild LV dysfunction with EF 40-45% with moderate LVH and grade 1DD, mild AR and inferior infarct. Since last echo EF has declined. - stress Myoview showed large fixed defect involving the inferior and lateral walls with no ischemia and EF 29% but by echo is 40-45%. Stress test read as high risk only due to low EF that was understimated based on echo findings.  - he had ST elevation in inferior leads in February during his  dissection repair felt secondary to air embolism in the RCA or dissection flap closing the RCA temporarily. He did not have any further evaluation since then. EKG shows no change in ST elevation since Feb. Serial trop negative. Denies any chest pain. At this time there is no ischemia on nuclear stress test so it was decided during this last hospitalization to NOT pursue cath at this time.  He has done ok clinically since discharge. His symptoms have improved but his weight is up. Does not look to be volume overloaded on exam. Needs medicines clarified. Recheck BMET and BNP today. See Dr. Herbie Baltimore back on Friday as planned and have asked him to bring all of his medicine bottles with him.    2. HTN: BP improved. Continue Clonidine/ARB/Coreg - but his medicines need clarifying. [facility] coming later this week to fill his pill box. He is to show them the copy of his AVS from today's visit.   3. Hx of substance abuse: Patient denies any recent use of cocaine.   4. Chronic combined systolic/diastolic CHF NYHA class II. On lasix. Needs lab today. Weight is up but he does not look to be volume overloaded - recheck lab today. He has had abrupt spells of dyspnea but this has resolved with starting back on Symbicort - ok to continue for now. Will increase his Coreg up and would like to increase ARB on return. Probably repeat echo in 3 months but will defer to Dr. Herbie Baltimore. Encouraged to weigh daily, restrict salt and get medicines clarified.   5. Aortic dissection - s/p repair - seeing Dr. Maren Beach later this month.   6. Multi Tobacco abuse - says he has stopped.  Says he could drink less alcohol.   Current medicines are reviewed with the patient today.  The patient does not have concerns regarding medicines other than what has been noted above.  The following changes have been made:  See above.  Labs/ tests ordered today include:    Orders Placed This Encounter  Procedures  . Brain natriuretic  peptide  . Basic metabolic panel     Disposition:   FU with Dr. Herbie Baltimore already planned for later this week.   Patient is agreeable to this plan and will call if any problems develop in the interim.   Signed: Rosalio Macadamia, RN, ANP-C 01/17/2016 4:06 PM  [facility] [address] [place], [place]  [address] Phone: [phone] Fax: [phone]

## 2016-01-17 NOTE — Patient Instructions (Addendum)
We will be checking the following labs today -  BMET & BNP   Medication Instructions:    Continue with your current medicines.  BUT  I am increasing the Coreg to 9.75 mg twice a day - I have given you a written prescription.   Show this list of medicines to the [facility] nurse to verify your medicines.     Testing/Procedures To Be Arranged:  N/A  Follow-Up:   See Dr. Herbie BaltimoreHarding on Friday as planned - bring all your medicine bottles to this appointment    Other Special Instructions:   Weigh every morning - call us for weight gain over 3 pounds in 24 hours  Keep staying away from the salt  Congrats for stopping smoking    If you need a refill on your cardiac medications before your next appointment, please call your pharmacy.   Call the [facility] office at [phone] if you have any questions, problems or concerns.

## 2016-01-17 NOTE — Addendum Note (Signed)
Addended by: Rosalio MacadamiaGERHARDT, Berklie Dethlefs C on: 01/17/2016 04:13 PM   Modules accepted: Kipp BroodSmartSet

## 2016-01-17 NOTE — Telephone Encounter (Signed)
Pt stated was given inhaler on visit to ED [facility] and problems with An[facility], has F/U Apt with PCP Pt aware only discontinued Rx Mag Citrate for constipation

## 2016-01-18 LAB — BASIC METABOLIC PANEL
BUN: 23 mg/dL (ref 7–25)
CO2: 25 mmol/L (ref 20–31)
Calcium: 9.2 mg/dL (ref 8.6–10.3)
Chloride: 103 mmol/L (ref 98–110)
Creat: 1 mg/dL (ref 0.70–1.33)
Glucose, Bld: 91 mg/dL (ref 65–99)
Potassium: 4.6 mmol/L (ref 3.5–5.3)
Sodium: 138 mmol/L (ref 135–146)

## 2016-01-18 LAB — BRAIN NATRIURETIC PEPTIDE: Brain Natriuretic Peptide: 70.4 pg/mL (ref <100)

## 2016-01-20 ENCOUNTER — Ambulatory Visit: Payer: Medicaid Other | Attending: Family Medicine | Admitting: Family Medicine

## 2016-01-20 ENCOUNTER — Encounter: Payer: Self-pay | Admitting: Family Medicine

## 2016-01-20 VITALS — BP 107/69 | HR 76 | Temp 97.9°F | Resp 16 | Ht 68.0 in | Wt 242.0 lb

## 2016-01-20 DIAGNOSIS — Z720 Tobacco use: Secondary | ICD-10-CM | POA: Diagnosis not present

## 2016-01-20 DIAGNOSIS — Z79899 Other long term (current) drug therapy: Secondary | ICD-10-CM | POA: Diagnosis not present

## 2016-01-20 DIAGNOSIS — I429 Cardiomyopathy, unspecified: Secondary | ICD-10-CM | POA: Insufficient documentation

## 2016-01-20 DIAGNOSIS — Z87891 Personal history of nicotine dependence: Secondary | ICD-10-CM | POA: Diagnosis not present

## 2016-01-20 DIAGNOSIS — Z7982 Long term (current) use of aspirin: Secondary | ICD-10-CM | POA: Insufficient documentation

## 2016-01-20 DIAGNOSIS — R0602 Shortness of breath: Secondary | ICD-10-CM | POA: Insufficient documentation

## 2016-01-20 DIAGNOSIS — I255 Ischemic cardiomyopathy: Secondary | ICD-10-CM | POA: Diagnosis not present

## 2016-01-20 MED ORDER — POTASSIUM CHLORIDE CRYS ER 20 MEQ PO TBCR: EXTENDED_RELEASE_TABLET | ORAL | Status: DC

## 2016-01-20 MED ORDER — FUROSEMIDE 40 MG PO TABS: ORAL_TABLET | ORAL | Status: DC

## 2016-01-20 MED ORDER — SYMBICORT 160-4.5 MCG/ACT IN AERO
2.0000 | INHALATION_SPRAY | Freq: Every day | RESPIRATORY_TRACT | Status: DC
Start: 2016-01-20 — End: 2016-03-09

## 2016-01-20 NOTE — Patient Instructions (Addendum)
Juvencio was seen today for hospitalization follow-up.  Diagnoses and all orders for this visit:  Cardiomyopathy, ischemic-40-45% -     furosemide (LASIX) 40 MG tablet; Take 40 mg in the morning with an additional 40 mg in the afternoon if weight gain is greater than 3 lbs from previous day -     potassium chloride SA (K-DUR,KLOR-CON) 20 MEQ tablet; 20 mEq by mouth daily with lasix, take twice daily on the days you take an extra dose of lasix  Tobacco abuse -     Ambulatory referral to Pulmonology -     SYMBICORT 160-4.5 MCG/ACT inhaler; Inhale 2 puffs into the lungs daily.  SOB (shortness of breath) -     Ambulatory referral to Pulmonology -     SYMBICORT 160-4.5 MCG/ACT inhaler; Inhale 2 puffs into the lungs daily.   Referral to pulmonology for pulmonary function test  F/u in 3-4 weeks for skin tag removal, 30 minute procedure visit   Dr. Armen PickupFunches

## 2016-01-20 NOTE — Progress Notes (Signed)
HFU. Pt stated SHOB two weekend ago. Pt stated gain 11Lb in 7 days, possible fluid gain  No pain today  Former smoker  No suicidal thoughts in the past two weeks

## 2016-01-20 NOTE — Assessment & Plan Note (Signed)
SOB episodes relieved by symbicort in former heavy smoker COPD most likely Continue symbicort, patient feels that once daily is enough He has quit smoking  pulm referral for PFTs

## 2016-01-20 NOTE — Assessment & Plan Note (Signed)
No evidence of acute exacerbation. There is weight gain. Advised increase lasix with potassium to BID 8 AM and 2 PM prn weight gain > 3 # then return to once daily once weight back down to baseline which for patient is 230 #.   Keep cardiology follow up Continue smoking cessation Stressed the importance of low salt diet

## 2016-01-20 NOTE — Progress Notes (Signed)
Subjective:  Patient ID: [name], male    DOB: 07/21/1961  Age: 55 y.o. MRN: [id number]  CC: Hospitalization Follow-up   HPI [name] presents for   1. HFU for SOB: he was hospitalized from 4/28-01/10/2016. He was admitted by the cardiology services for SOB in setting of recent thoracic aorta dissection rupture and repair in the setting of cocaine abuse. Trop were negative. He did not require cardiac cath. He was found to have mild to moderated CHF with EF 40-45%, grade 2 diastolic dysfunction. He had HFU with cardiology on 01/17/2016 and his coreg dose was increased. He reports 1-2 episodes of SOB since being at home. Episodes occurred at rest when lying down. Improved with symbicort. He denies CP, dizziness, lightheadedness. He is having polyuria from lasix. He is concerned about 11 # weight gain. He is not on a fluid restriction or low salt diet. He is not smoking. For the past 3 months. He had 2 beers on his birthday.   Social History  Substance Use Topics  . Smoking status: Former Smoker    Types: Cigarettes    Quit date: 10/25/2015  . Smokeless tobacco: Not on file  . Alcohol Use: 0.0 oz/week    0 Standard drinks or equivalent per week     Comment: [facility]   Outpatient Prescriptions Prior to Visit  Medication Sig Dispense Refill  . acetaminophen (TYLENOL) 325 MG tablet Take 2 tablets (650 mg total) by mouth every 4 (four) hours as needed for headache or mild pain.    Marland Kitchen. aspirin EC 81 MG EC tablet Take 1 tablet (81 mg total) by mouth daily.    Marland Kitchen. atorvastatin (LIPITOR) 40 MG tablet Take 1 tablet (40 mg total) by mouth daily at 6 PM. 90 tablet 3  . carvedilol (COREG) 6.25 MG tablet Take 1.5 tablets (9.375 mg total) by mouth 2 (two) times daily with a meal. 90 tablet 11  . cetirizine (ZYRTEC) 10 MG tablet Take 1 tablet (10 mg total) by mouth daily. 30 tablet 11  . cloNIDine (CATAPRES) 0.2 MG tablet Take 1 tablet (0.2 mg total) by mouth 2 (two) times daily. 60 tablet 1  .  fluticasone (FLONASE) 50 MCG/ACT nasal spray Place 2 sprays into both nostrils daily. 16 g 6  . furosemide (LASIX) 40 MG tablet Take 1 tablet (40 mg total) by mouth daily. 30 tablet 11  . losartan (COZAAR) 50 MG tablet Take 1 tablet (50 mg total) by mouth daily. 30 tablet 11  . nitroGLYCERIN (NITROSTAT) 0.4 MG SL tablet Place 1 tablet (0.4 mg total) under the tongue every 5 (five) minutes x 3 doses as needed for chest pain. 25 tablet 2  . polyethylene glycol (MIRALAX / GLYCOLAX) packet Take 17 g by mouth daily as needed. 30 each 5  . potassium chloride SA (K-DUR,KLOR-CON) 20 MEQ tablet Take 1 tablet (20 mEq total) by mouth daily. 30 tablet 11  . SYMBICORT 160-4.5 MCG/ACT inhaler   12  . traZODone (DESYREL) 50 MG tablet Take 0.5-1 tablets (25-50 mg total) by mouth at bedtime as needed for sleep. 30 tablet 3   No facility-administered medications prior to visit.    ROS Review of Systems  Constitutional: Negative for fever, chills, fatigue and unexpected weight change.  Eyes: Negative for visual disturbance.  Respiratory: Positive for shortness of breath. Negative for cough.   Cardiovascular: Negative for chest pain, palpitations and leg swelling.  Gastrointestinal: Negative for nausea, vomiting, abdominal pain, diarrhea, constipation and blood  in stool.  Endocrine: Negative for polydipsia, polyphagia and polyuria.  Musculoskeletal: Negative for myalgias, back pain, arthralgias, gait problem and neck pain.  Skin: Negative for rash.  Allergic/Immunologic: Negative for immunocompromised state.  Hematological: Negative for adenopathy. Does not bruise/bleed easily.  Psychiatric/Behavioral: Negative for suicidal ideas, sleep disturbance and dysphoric mood. The patient is not nervous/anxious.     Objective:  BP 107/69 mmHg  Pulse 76  Temp(Src) 97.9 F (36.6 C) (Oral)  Resp 16  Ht  (1.727 m)  Wt 242 lb (109.77 kg)  BMI 36.80 kg/m2  SpO2 97%  BP/Weight 01/20/2016 01/17/2016 01/10/2016    Systolic BP 107 130 131  Diastolic BP 69 70 84  Wt. (Lbs) 242 241.6 230.5  BMI 36.8 36.74 -    Physical Exam  Constitutional: He appears well-developed and well-nourished. No distress.  Obese   HENT:  Head: Normocephalic and atraumatic.  Neck: Normal range of motion. Neck supple.  Cardiovascular: Normal rate, regular rhythm, normal heart sounds and intact distal pulses.   Pulmonary/Chest: Effort normal and breath sounds normal.  Musculoskeletal: He exhibits no edema.  Neurological: He is alert.  Skin: Skin is warm and dry. No rash noted. No erythema.  Psychiatric: He has a normal mood and affect.   Assessment & Plan:   Carly was seen today for hospitalization follow-up.  Diagnoses and all orders for this visit:  Cardiomyopathy, ischemic-40-45% -     furosemide (LASIX) 40 MG tablet; Take 40 mg in the morning with an additional 40 mg in the afternoon if weight gain is greater than 3 lbs from previous day -     potassium chloride SA (K-DUR,KLOR-CON) 20 MEQ tablet; 20 mEq by mouth daily with lasix, take twice daily on the days you take an extra dose of lasix  Tobacco abuse -     Ambulatory referral to Pulmonology -     SYMBICORT 160-4.5 MCG/ACT inhaler; Inhale 2 puffs into the lungs daily.  SOB (shortness of breath) -     Ambulatory referral to Pulmonology -     SYMBICORT 160-4.5 MCG/ACT inhaler; Inhale 2 puffs into the lungs daily.   Meds ordered this encounter  Medications  . furosemide (LASIX) 40 MG tablet    Sig: Take 40 mg in the morning with an additional 40 mg in the afternoon if weight gain is greater than 3 lbs from previous day    Dispense:  60 tablet    Refill:  11  . SYMBICORT 160-4.5 MCG/ACT inhaler    Sig: Inhale 2 puffs into the lungs daily.    Dispense:  1 Inhaler    Refill:  12    Follow-up: No Follow-up on file.   Dessa Phi MD

## 2016-01-21 ENCOUNTER — Ambulatory Visit: Payer: Self-pay | Admitting: Cardiology

## 2016-01-24 ENCOUNTER — Ambulatory Visit: Payer: Medicaid Other | Attending: Family Medicine

## 2016-01-27 ENCOUNTER — Telehealth: Payer: Self-pay | Admitting: Cardiology

## 2016-01-27 ENCOUNTER — Encounter: Payer: Self-pay | Admitting: Cardiology

## 2016-01-27 DIAGNOSIS — I5022 Chronic systolic (congestive) heart failure: Secondary | ICD-10-CM

## 2016-01-27 MED ORDER — CARVEDILOL 6.25 MG PO TABS: 9.7500 mg | ORAL_TABLET | Freq: Two times a day (BID) | ORAL | Status: DC

## 2016-01-27 NOTE — Telephone Encounter (Signed)
Refill sent to the pharmacy electronically.  

## 2016-01-27 NOTE — Telephone Encounter (Signed)
New message     *STAT* If patient is at the pharmacy, call can be transferred to refill team.   1. Which medications need to be refilled? (please list name of each medication and dose if known) Carvedilol 6.25mg  1.5 tablet 2 daily with meals  2. Which pharmacy/location (including street and city if local pharmacy) is medication to be sent to? [facility] in [place]  3. Do they need a 30 day or 90 day supply? 30 day  Fax number for the pharmacy [phone]

## 2016-02-01 ENCOUNTER — Other Ambulatory Visit: Payer: Self-pay | Admitting: *Deleted

## 2016-02-01 DIAGNOSIS — Z9889 Other specified postprocedural states: Secondary | ICD-10-CM

## 2016-02-02 ENCOUNTER — Encounter: Payer: Medicaid Other | Admitting: Cardiothoracic Surgery

## 2016-02-04 ENCOUNTER — Encounter: Payer: Self-pay | Admitting: Cardiothoracic Surgery

## 2016-02-04 ENCOUNTER — Ambulatory Visit
Admission: RE | Admit: 2016-02-04 | Discharge: 2016-02-04 | Disposition: A | Discharge status: Home / Self Care | Payer: Medicaid Other | Source: Ambulatory Visit | Attending: Cardiothoracic Surgery | Admitting: Cardiothoracic Surgery

## 2016-02-04 ENCOUNTER — Ambulatory Visit (INDEPENDENT_AMBULATORY_CARE_PROVIDER_SITE_OTHER): Payer: Medicaid Other | Admitting: Cardiothoracic Surgery

## 2016-02-04 VITALS — BP 140/90 | HR 77 | Resp 20 | Ht 68.0 in | Wt 242.0 lb

## 2016-02-04 DIAGNOSIS — I5030 Unspecified diastolic (congestive) heart failure: Secondary | ICD-10-CM

## 2016-02-04 DIAGNOSIS — Z9889 Other specified postprocedural states: Secondary | ICD-10-CM | POA: Diagnosis not present

## 2016-02-04 DIAGNOSIS — I63531 Cerebral infarction due to unspecified occlusion or stenosis of right posterior cerebral artery: Secondary | ICD-10-CM

## 2016-02-04 DIAGNOSIS — H53462 Homonymous bilateral field defects, left side: Secondary | ICD-10-CM | POA: Diagnosis not present

## 2016-02-04 DIAGNOSIS — I7101 Dissection of thoracic aorta: Secondary | ICD-10-CM | POA: Diagnosis not present

## 2016-02-04 DIAGNOSIS — I71019 Dissection of thoracic aorta, unspecified: Secondary | ICD-10-CM

## 2016-02-04 NOTE — Progress Notes (Signed)
PCP is Lora PaulaFUNCHES, JOSALYN C, MD Referring Provider is Lorre NickAllen, Anthony, MD  Chief Complaint  Patient presents with  . Routine Post Op    2 month f/u with CXR    WGN:FAOZHYQHPI:patient returns for schedule followup almost 3 months after repair of ascending aortic standard type A dissection with a straight graft hemi-arch repair and resuspension of aortic valve. He is doing well. He has not been smoking or using drugs. He's trying to keep his medications for blood pressure and cardiac disease straight.  The patient presented to the emergency department last month for some exertional shortness of breath. He was not having chest pain. He probably had some fluid retention and diastolic heart clear. He had CT to rule out pulmonary embolus. He underwent echocardiogram which showed ejection fraction of 45% which is baseline for him. His aortic valve had no significant regurgitation. There is no pericardial effusion. He underwent a stress test which showed a inferior scar but a questionable significance. He basically improved with some diuretics and resuming his Symbicort inhaler. Since the left the hospital his symptoms have significantly improved and his weight has remained stable around 240s on 40 mg Lasix daily.  His visual problems are minimally better from his preoperative posterior circulation stroke. Past Medical History  Diagnosis Date  . Malignant hypertension Dx 2016  . Aortic dissection, thoracic (HCC) 10/28/2015  . CVA (cerebral infarction) 10/2015  . History of acute inferior wall MI 10/2015    Peri-Operative Inferior STEMI, presumably related to air embolism. -- Inferior Akinesis on Echo & Large Inferior-Inferolateral Infarct on Myoview  . Ischemic cardiomyopathy 10/2015    EF now 40-45% by Echo (~29% by Myoview)     Past Surgical History  Procedure Laterality Date  . Rectal abscess    . Thoracic aortic aneurysm repair N/A 10/28/2015    Procedure: TYPE A AORTIC DISSECTION REPAIR WITH CIRC ARREST;   Surgeon: Kerin PernaPeter Van Trigt, MD;  [facility];  Service: Open Heart Surgery;  Laterality: N/A;  . Tee without cardioversion N/A 10/28/2015    Procedure: TRANSESOPHAGEAL ECHOCARDIOGRAM (TEE);  Surgeon: Kerin PernaPeter Van Trigt, MD;  [facility];  Service: Open Heart Surgery;  Laterality: N/A;  . Transthoracic echocardiogram  01/09/2016    ef 40-45%, Mod Conc LVH. diffuse HK with AKinesis of basal-mid inferior wall. Gr 1 DD.  - c/w prior Inferior MI  . Nm myoview ltd  01/08/2016    (~EF 29 %), LARGE FIXED INFERIO-INFEROLATERAL WALL c/s prior Inferior MI. - Read as HIGH RISK due to LOW EF & Large Infarct    Family History  Problem Relation Age of Onset  . Diabetes Father   . Heart disease Father   . Dementia Father     Social History Social History  Substance Use Topics  . Smoking status: Former Smoker    Types: Cigarettes    Quit date: 10/25/2015  . Smokeless tobacco: None  . Alcohol Use: 0.0 oz/week    0 Standard drinks or equivalent per week     Comment: OCC    Current Outpatient Prescriptions  Medication Sig Dispense Refill  . acetaminophen (TYLENOL) 325 MG tablet Take 2 tablets (650 mg total) by mouth every 4 (four) hours as needed for headache or mild pain.    Marland Kitchen. aspirin EC 81 MG EC tablet Take 1 tablet (81 mg total) by mouth daily.    Marland Kitchen. atorvastatin (LIPITOR) 40 MG tablet Take 1 tablet (40 mg total) by mouth daily at 6 PM. 90 tablet  3  . carvedilol (COREG) 6.25 MG tablet Take 1.5 tablets (9.375 mg total) by mouth 2 (two) times daily with a meal. 90 tablet 11  . cetirizine (ZYRTEC) 10 MG tablet Take 1 tablet (10 mg total) by mouth daily. 30 tablet 11  . cloNIDine (CATAPRES) 0.2 MG tablet Take 1 tablet (0.2 mg total) by mouth 2 (two) times daily. 60 tablet 1  . fluticasone (FLONASE) 50 MCG/ACT nasal spray Place 2 sprays into both nostrils daily. 16 g 6  . furosemide (LASIX) 40 MG tablet Take 40 mg in the morning with an additional 40 mg in the afternoon if weight gain is greater than 3  lbs from previous day 60 tablet 11  . losartan (COZAAR) 50 MG tablet Take 1 tablet (50 mg total) by mouth daily. 30 tablet 11  . nitroGLYCERIN (NITROSTAT) 0.4 MG SL tablet Place 1 tablet (0.4 mg total) under the tongue every 5 (five) minutes x 3 doses as needed for chest pain. 25 tablet 2  . polyethylene glycol (MIRALAX / GLYCOLAX) packet Take 17 g by mouth daily as needed. 30 each 5  . potassium chloride SA (K-DUR,KLOR-CON) 20 MEQ tablet 20 mEq by mouth daily with lasix, take twice daily on the days you take an extra dose of lasix 60 tablet 11  . SYMBICORT 160-4.5 MCG/ACT inhaler Inhale 2 puffs into the lungs daily. 1 Inhaler 12  . traZODone (DESYREL) 50 MG tablet Take 0.5-1 tablets (25-50 mg total) by mouth at bedtime as needed for sleep. 30 tablet 3   No current facility-administered medications for this visit.    No Known Allergies  Review of Systems   Improved strength and appetite .Using drugs or tobacco Try to take his blood pressure medications as directed  BP 140/90 mmHg  Pulse 77  Resp 20  Ht  (1.727 m)  Wt 242 lb (109.77 kg)  BMI 36.80 kg/m2  SpO2 97% Physical Exam Alert and comfortable Peripheral pulses intact No focal weakness Lungs clear Heart rate regular without murmur No pedal edema  Diagnostic Tests: Studies that were performed from his last hospitalization her personally reviewed and counseled the patient including echocardiogram and CT a to rule out PE.  Impression: Recovery after repair of type A dissection As expected. Controlling blood pressure fairly well. Patient will be scheduled for followup CTA of the thoracic aorta 6 months after surgery. He is encouraged to be compliant with his blood pressure medications and to abstain from tobacco or drug use. Plan:return in August with CTA of the thoracic aorta   Mikey Bussing, MD [facility] [phone]

## 2016-02-09 ENCOUNTER — Telehealth: Payer: Self-pay | Admitting: Family Medicine

## 2016-02-09 ENCOUNTER — Encounter: Payer: Self-pay | Admitting: Internal Medicine

## 2016-02-09 ENCOUNTER — Ambulatory Visit (INDEPENDENT_AMBULATORY_CARE_PROVIDER_SITE_OTHER): Payer: Medicaid Other | Admitting: Internal Medicine

## 2016-02-09 VITALS — BP 110/76 | HR 78 | Ht 68.0 in | Wt 248.0 lb

## 2016-02-09 DIAGNOSIS — J449 Chronic obstructive pulmonary disease, unspecified: Secondary | ICD-10-CM | POA: Diagnosis not present

## 2016-02-09 DIAGNOSIS — R0600 Dyspnea, unspecified: Secondary | ICD-10-CM

## 2016-02-09 NOTE — Telephone Encounter (Signed)
Pt  Advised to call [facility] for transfer  Pt stated will call today

## 2016-02-09 NOTE — Telephone Encounter (Signed)
Pt. Called requesting for his PCP to send all his current medication [facility].  Please f/u with pt.

## 2016-02-09 NOTE — Assessment & Plan Note (Signed)
Quit smoking 10/2015 Spirometry 02/09/2016  FEV1 1.91  (62%)  Ratio 72 - 02/09/2016  After extensive coaching HFA effectiveness =    75% from baseline of < 25% s symbicort am of ov     So he doe not have significant copd but could have AB based on response to symbicort and did not actually use it correctly so reasonable to rx x 4 weeks at approp dose = Take 2 puffs first thing in am and then another 2 puffs about 12 hours later then regroup in 4-6 weeks.  Total time devoted to counseling  = 35/416m review case with pt/ discussion of options/alternatives/ personally creating written instructions  in presence of pt  then going over those specific  Instructions directly with the pt including how to use all of the meds but in particular covering each new medication in detail and the difference between the maintenance/automatic meds and the prns using an action plan format for the latter.

## 2016-02-09 NOTE — Progress Notes (Signed)
Subjective:    Patient ID: [name], male    DOB: [birth year],    MRN: [id number]  HPI  55 yobm quit smoking 10/2015 @ wt = 241 at T aneurysm surgery more limited by R  hip and than breathing at baseline but slept on several pillows/ some mild doe x steps  but post op has not recovered back to baseline activity tol and has to sleep in a recliner so referred to pulmonary clinic 02/09/2016 by Dr Armen Pickup   02/09/2016 1st [facility] office visit/ [name]   Chief Complaint  Patient presents with  . Pulmonary Consult    Referred by Dr. Dessa Phi for COPD eval. Pt c/o SOB since Feb 2017-symbicort has helped.   when tries to tilt recliner more flat x 10-77min sob  Breathing daytime better on symbicort 160 x 2 each am but hfa poor and not clear it really helped but no longer having sob at rest unless trying to lie flat Ex limited by him pain now, not sob  No obvious day to day or daytime variability or assoc excess/ purulent sputum or mucus plugs or hemoptysis or cp or chest tightness, subjective wheeze or overt sinus or hb symptoms. No unusual exp hx or h/o childhood pna/ asthma or knowledge of premature birth.  Sleeping ok in recliner without nocturnal  or early am exacerbation  of respiratory  c/o's or need for noct saba. Also denies any obvious fluctuation of symptoms with weather or environmental changes or other aggravating or alleviating factors except as outlined above   Current Medications, Allergies, Complete Past Medical History, Past Surgical History, Family History, and Social History were reviewed in [employer] record.      Review of Systems  Constitutional: Negative for fever, chills, activity change, appetite change and unexpected weight change.  HENT: Positive for dental problem. Negative for congestion, postnasal drip, rhinorrhea, sneezing, sore throat, trouble swallowing and voice change.   Eyes: Negative for visual disturbance.    Respiratory: Positive for shortness of breath. Negative for cough and choking.   Cardiovascular: Negative for chest pain and leg swelling.  Gastrointestinal: Negative for nausea, vomiting and abdominal pain.  Genitourinary: Negative for difficulty urinating.  Musculoskeletal: Negative for arthralgias.  Skin: Negative for rash.  Psychiatric/Behavioral: Negative for behavioral problems and confusion.       Objective:   Physical Exam amb obese bm  Wt Readings from Last 3 Encounters:  02/09/16 248 lb (112.492 kg)  02/04/16 242 lb (109.77 kg)  01/20/16 242 lb (109.77 kg)    Vital signs reviewed    HEENT: nl dentition, turbinates, and oropharynx. Nl external ear canals without cough reflex   NECK :  without JVD/Nodes/TM/ nl carotid upstrokes bilaterally   LUNGS: no acc muscle use,  Nl contour chest which is clear to A and P bilaterally without cough on insp or exp maneuvers   CV:  RRR  no s3 or murmur or increase in P2, no edema   ABD:  soft and nontender with nl inspiratory excursion in the supine position. No bruits or organomegaly, bowel sounds nl  MS:  Nl gait/ ext warm without deformities, calf tenderness, cyanosis or clubbing No obvious joint restrictions   SKIN: warm and dry without lesions    NEURO:  alert, approp, nl sensorium with  no motor deficits      I personally reviewed images and agree with radiology impression as follows:  CXR:  02/04/16  Normal sized heart. Stable  linear scarring in the left lower lung zone. Clear right lung. Stable median sternotomy wires and mediastinal surgical clips. Mildly elevated left hemidiaphragm with mild progression. Mild thoracic spine degenerative changes.         Assessment & Plan:

## 2016-02-09 NOTE — Assessment & Plan Note (Signed)
02/09/2016   Walked RA  2 laps @ 185 ft each stopped due to  Hip pain > sob/ nl pace, no desats - Sniff test 02/09/2016 >>>   The pattern of sob that is more noticeable w/in 10 min supine than with exertion assoc with elevation of L HD suggests possible diaphragm paresis post op p T Aneurysm surgery and is best eval by Sniff test and wt loss but for now sleeping in recliner and trying the symb 160 2bid to see if any noct wheeze active.

## 2016-02-09 NOTE — Patient Instructions (Addendum)
Please see patient coordinator before you leave today  to schedule sniff test   Symbicort 160 Take 2 puffs first thing in am and then another 2 puffs about 12 hours later.   Work on inhaler technique:  relax and gently blow all the way out then take a nice smooth deep breath back in, triggering the inhaler at same time you start breathing in.  Hold for up to 5 seconds if you can. Blow out thru nose. Rinse and gargle with water when done    Please schedule a follow up office visit in 4 weeks, sooner if needed

## 2016-02-11 ENCOUNTER — Ambulatory Visit (HOSPITAL_COMMUNITY)
Admission: RE | Admit: 2016-02-11 | Discharge: 2016-02-11 | Disposition: A | Discharge status: Home / Self Care | Payer: Medicaid Other | Source: Ambulatory Visit | Attending: Internal Medicine | Admitting: Internal Medicine

## 2016-02-11 DIAGNOSIS — R0600 Dyspnea, unspecified: Secondary | ICD-10-CM | POA: Diagnosis present

## 2016-02-11 NOTE — Progress Notes (Signed)
Quick Note:  Spoke with pt and notified of results per Dr. Wert. Pt verbalized understanding and denied any questions.  ______ 

## 2016-02-14 ENCOUNTER — Telehealth: Payer: Self-pay | Admitting: *Deleted

## 2016-02-14 ENCOUNTER — Ambulatory Visit (AMBULATORY_SURGERY_CENTER): Payer: Self-pay | Admitting: *Deleted

## 2016-02-14 VITALS — Ht 68.0 in | Wt 251.0 lb

## 2016-02-14 DIAGNOSIS — Z1211 Encounter for screening for malignant neoplasm of colon: Secondary | ICD-10-CM

## 2016-02-14 MED ORDER — NA SULFATE-K SULFATE-MG SULF 17.5-3.13-1.6 GM/177ML PO SOLN: 1.0000 | Freq: Once | ORAL | Status: AC

## 2016-02-14 NOTE — Telephone Encounter (Addendum)
Dr Christella HartiganJacobs,  pls review pt hx from Feb 2017, pt was admitted from ED with CVA and ascending aorta dissection due cocaine use and non-compliant anti-hypertensive measures, in reviewing hx Dr Herbie BaltimoreHarding notes MI during aorta repair from suspected air emboli, myoview show EF 29% from 12/2015 but echo shows 40-45% from 12/2015,pt was seen in pre-visit 02/14/16 for colonoscopy scheduled 02/28/16, advised pt he needs cardiac clearance when he sees Dr Herbie BaltimoreHarding on 02/22/16. after reviewing his chart do you want to see pt in office or wait til we get cardiac clearance and continue with direct?-adm

## 2016-02-14 NOTE — Telephone Encounter (Signed)
Please cancel his upcoming colonoscopy. He needs NGI office visit before deciding on mode, timing of colon cancer screening.

## 2016-02-14 NOTE — Progress Notes (Signed)
No egg or soy allergy. No anesthesia problems.  No home O2.  No diet meds.  

## 2016-02-15 ENCOUNTER — Ambulatory Visit: Payer: Self-pay | Admitting: Cardiology

## 2016-02-16 ENCOUNTER — Encounter: Payer: Self-pay | Admitting: Gastroenterology

## 2016-02-16 NOTE — Telephone Encounter (Signed)
[name] was very upset when I advised him his procedure was cancelled on 02-28-16. Very insistent on rescheduling it and not scheduling an office visit. Kepp repeating he already had an office visit on Monday and did not need another one. I explained that he did not see a physician only the nurse but he just kept getting angrier with me. We did not seem to be getting anywhere. Then he insisted on speaking to the same person that did his "office visit" on Monday feels that she can clear everything up.

## 2016-02-17 ENCOUNTER — Ambulatory Visit: Payer: Medicaid Other | Attending: Family Medicine | Admitting: Family Medicine

## 2016-02-17 ENCOUNTER — Encounter: Payer: Self-pay | Admitting: Family Medicine

## 2016-02-17 ENCOUNTER — Telehealth: Payer: Self-pay | Admitting: Neurology

## 2016-02-17 VITALS — BP 132/80 | HR 83 | Temp 97.0°F | Resp 16 | Ht 68.0 in | Wt 248.0 lb

## 2016-02-17 DIAGNOSIS — L918 Other hypertrophic disorders of the skin: Secondary | ICD-10-CM

## 2016-02-17 DIAGNOSIS — I10 Essential (primary) hypertension: Secondary | ICD-10-CM

## 2016-02-17 DIAGNOSIS — R05 Cough: Secondary | ICD-10-CM | POA: Insufficient documentation

## 2016-02-17 DIAGNOSIS — M25551 Pain in right hip: Secondary | ICD-10-CM | POA: Insufficient documentation

## 2016-02-17 DIAGNOSIS — G8929 Other chronic pain: Secondary | ICD-10-CM | POA: Insufficient documentation

## 2016-02-17 DIAGNOSIS — M1611 Unilateral primary osteoarthritis, right hip: Secondary | ICD-10-CM

## 2016-02-17 MED ORDER — CLONIDINE HCL 0.2 MG PO TABS: 0.2000 mg | ORAL_TABLET | Freq: Two times a day (BID) | ORAL | Status: DC

## 2016-02-17 MED ORDER — CLONIDINE HCL 0.2 MG PO TABS
0.2000 mg | ORAL_TABLET | Freq: Two times a day (BID) | ORAL | Status: DC
Start: 2016-02-17 — End: 2016-02-17

## 2016-02-17 NOTE — Progress Notes (Signed)
Patient ID: [name], male   DOB: 09/23/1960, 55 y.o.   MRN: [id number] Skin Tag Removal Procedure Note  Pre-operative Diagnosis: Classic skin tags (acrochordon)  Post-operative Diagnosis: Classic skin tags (acrochordon)  Locations:right thigh medial x 1 and lateral x 1 and L upper back   Indications: pain, cosmetic   Anesthesia: Lidocaine 1% with epinephrine without added sodium bicarbonate  Procedure Details  The risks (including bleeding and infection) and benefits of the procedure and Written informed consent obtained. Using sterile iris scissors, multiple skin tags were snipped off at their bases after cleansing with Betadine.  Bleeding was controlled by pressure.   Findings: Pathognomonic benign lesions  not sent for pathological exam.  Condition: Stable  Complications: none.  Plan: 1. Instructed to keep the wounds dry and covered for 24-48h and clean thereafter. 2. Warning signs of infection were reviewed.   3. Recommended that the patient use OTC acetaminophen as needed for pain.  4. Return as needed.

## 2016-02-17 NOTE — Patient Instructions (Addendum)
[name] was seen today for follow-up.  Diagnoses and all orders for this visit:  Essential hypertension -     Discontinue: cloNIDine (CATAPRES) 0.2 MG tablet; Take 1 tablet (0.2 mg total) by mouth 2 (two) times daily. -     cloNIDine (CATAPRES) 0.2 MG tablet; Take 1 tablet (0.2 mg total) by mouth 2 (two) times daily.  Skin tag  Primary osteoarthritis of right hip -     Ambulatory referral to Orthopedic Surgery   F/u in 3 months for HTN  Dr. Armen PickupFunches

## 2016-02-17 NOTE — Progress Notes (Signed)
F/U medication refills Catapres Skin tag  On leg and back  Hip pain scale # 8 No tobacco user  No suicidal thought in the past two weeks

## 2016-02-17 NOTE — Progress Notes (Signed)
Patient ID: [name], male   DOB: 08/31/1961, 55 y.o.   MRN: [id number] S: Cough x 2 days, black sputum. No fever, chills, CP or SOB. Request med refill for clonidine Chronic R hip pain request ortho referral  O: Blood pressure 132/80, pulse 83, temperature 97 F (36.1 C), temperature source Oral, resp. rate 16, height 5\' 8"  (1.727 m), weight 248 lb (112.492 kg), SpO2 97 %. Normal lung exam Normal nose  R hip with limited ROM  [name] was seen today for follow-up.  Diagnoses and all orders for this visit:  Essential hypertension -     Discontinue: cloNIDine (CATAPRES) 0.2 MG tablet; Take 1 tablet (0.2 mg total) by mouth 2 (two) times daily. -     cloNIDine (CATAPRES) 0.2 MG tablet; Take 1 tablet (0.2 mg total) by mouth 2 (two) times daily.  Skin tag  Primary osteoarthritis of right hip -     Ambulatory referral to Orthopedic Surgery

## 2016-02-18 NOTE — Telephone Encounter (Signed)
Faxed records to [facility] on 6/8 and recv'd confirmation 02/17/16 @ [id number]

## 2016-02-22 ENCOUNTER — Ambulatory Visit (INDEPENDENT_AMBULATORY_CARE_PROVIDER_SITE_OTHER): Payer: Medicaid Other | Admitting: Cardiology

## 2016-02-22 ENCOUNTER — Encounter: Payer: Self-pay | Admitting: Cardiology

## 2016-02-22 VITALS — BP 105/65 | HR 66 | Ht 68.0 in | Wt 252.0 lb

## 2016-02-22 DIAGNOSIS — I7103 Dissection of thoracoabdominal aorta: Secondary | ICD-10-CM

## 2016-02-22 DIAGNOSIS — I5022 Chronic systolic (congestive) heart failure: Secondary | ICD-10-CM

## 2016-02-22 DIAGNOSIS — I10 Essential (primary) hypertension: Secondary | ICD-10-CM | POA: Diagnosis not present

## 2016-02-22 DIAGNOSIS — F1410 Cocaine abuse, uncomplicated: Secondary | ICD-10-CM

## 2016-02-22 DIAGNOSIS — Z01810 Encounter for preprocedural cardiovascular examination: Secondary | ICD-10-CM

## 2016-02-22 DIAGNOSIS — I255 Ischemic cardiomyopathy: Secondary | ICD-10-CM

## 2016-02-22 DIAGNOSIS — I480 Paroxysmal atrial fibrillation: Secondary | ICD-10-CM

## 2016-02-22 DIAGNOSIS — M1611 Unilateral primary osteoarthritis, right hip: Secondary | ICD-10-CM

## 2016-02-22 MED ORDER — ATORVASTATIN CALCIUM 40 MG PO TABS: 40.0000 mg | ORAL_TABLET | Freq: Every day | ORAL | Status: AC

## 2016-02-22 MED ORDER — POTASSIUM CHLORIDE CRYS ER 20 MEQ PO TBCR: EXTENDED_RELEASE_TABLET | ORAL | Status: AC

## 2016-02-22 MED ORDER — FUROSEMIDE 40 MG PO TABS: ORAL_TABLET | ORAL | Status: AC

## 2016-02-22 MED ORDER — LOSARTAN POTASSIUM 50 MG PO TABS: 50.0000 mg | ORAL_TABLET | Freq: Every day | ORAL | Status: AC

## 2016-02-22 MED ORDER — CLONIDINE HCL 0.2 MG PO TABS: 0.2000 mg | ORAL_TABLET | Freq: Two times a day (BID) | ORAL | Status: AC

## 2016-02-22 MED ORDER — CARVEDILOL 6.25 MG PO TABS: 9.7500 mg | ORAL_TABLET | Freq: Two times a day (BID) | ORAL | Status: AC

## 2016-02-22 NOTE — Progress Notes (Signed)
PCP: Minerva Ends, MD  Clinic Note: Chief Complaint  Patient presents with  . Follow-up    Aortic Dissection - s/p repair.  Complicated by CVA & RCA Infarction    HPI: [name] is a 55 y.o. male with a PMH below who presents today for One-month follow-up. I saw him in the hospital back in the end of April 20 presented with orthopnea PND and fatigue. He has a history of malignant hypertension and stroke status post thoracic aorta dissection repair of a Stanford type A dissection back in February 2017 in the setting of cocaine abuse. Apparently as a part of this he suffered an RCA infarct during his surgery. Echo showed EF 40-45% and Myoview revealed inferior scar artery catheterization was not performed either during his index hospitalization or during the second hospitalization.  [name] was last seen on 01/17/2016 by Remer Macho, NP. He stated he was doing "okay "had some spells of abruption shortness of breath, but they improved after starting back his Symbicort. He was not doing his daily weights, but did not notice any swelling. He complained his medicines were "all screwed up". He doesn't really know what he is taking. He still drinks a lot of beer. Not using cocaine anymore.  Recent Hospitalizations: April 2017  Studies Reviewed:  Myoview 01/08/2016: (~EF 29 %), LARGE FIXED INFERIO-INFEROLATERAL WALL c/s prior Inferior MI. - Read as HIGH RISK due to LOW EF & Large Infarct -- consistent with RCA infarct during his peri-op. With aortic dissection. He had huge ST elevations on his perioperative EKG.   Echo 01/09/2016: EF 40-45%. Diffuse hypokinesis with akinesis of the basal-mid wall. GR 1 DD. Mild aortic sclerosis but no stenosis. - Inferior infarct and reduced EF knee to from previously reported 55%.  Interval History: Doing presents today for second follow-up after being seen by Remer Macho. He says that he is really not having much in the way of his breathing  issues anymore. Much improved overall. Occasionally he has spells where he will use his inhaler and neck is better. He said the pulmonologist told him that he may have some issues with one of his hemidiaphragm.  At this point, he really is complaining most about significant pain in his right hip which is really keeping him from being active. He can't lose weight because he can't walk with it. He says that really has to work hard to walk because of pain and that probably contributes to some of his exertional dyspnea. He denies any chest tightness or pressure with rest or exertion. Swelling is been much controlled, but he still has to sleep elevated about 30 from orthopnea standpoint but denies any PND. See thinks that some of this is because of anxiety, and states it is been going on for a long time. He alsobeen coughing up a lot of phlegm intermittently and made be started to develop symptoms of a cold. No fevers or chills.  Despite having some orthopnea, he denies any PND. His edema is well controlled. He doesn't exertional dyspnea, but no exertional chest tightness or pressure.. No palpitations, lightheadedness, dizziness, weakness or syncope/near syncope. No TIA/amaurosis fugax symptoms. No melena, hematochezia, hematuria, or epstaxis. No claudication.  He also adamantly denies any further use of cocaine.  ROS: A comprehensive was performed. Review of Systems  Constitutional: Negative for malaise/fatigue.  HENT: Negative for nosebleeds.   Eyes: Negative for blurred vision.  Respiratory: Positive for cough and sputum production (Minimally productive).  Cardiovascular: Positive for orthopnea.       Per history of present illness  Musculoskeletal: Positive for joint pain (Right hip. Probably will need surgery.).  Neurological: Positive for dizziness (Occasionally if he turns around too fast.). Negative for loss of consciousness and headaches.  Endo/Heme/Allergies: Does not bruise/bleed  easily.  Psychiatric/Behavioral: Negative for depression and memory loss. The patient is not nervous/anxious and does not have insomnia.   All other systems reviewed and are negative.    Past Medical History  Diagnosis Date  . Malignant hypertension Dx 2016  . Aortic dissection, thoracic ([facility]) 10/28/2015  . CVA (cerebral infarction) 10/2015  . Myocardial infarction, inferior wall ([facility]) 10/2015    Peri-Operative Inferior STEMI, presumably related to air embolism. -- Inferior Akinesis on Echo & Large Inferior-Inferolateral Infarct on Myoview  . Ischemic cardiomyopathy 10/2015    EF now 40-45% by Echo (~29% by Myoview)   . Chronic combined systolic and diastolic CHF, NYHA class 2 (HCC)     as a result of Aortic Dissection  . Stroke with cerebral ischemia ([facility]) 10/25/15    as a result of Aortic Dissection  . GERD (gastroesophageal reflux disease)   . Hyperlipidemia   . COPD (chronic obstructive pulmonary disease) ([facility])   . Seasonal allergies   . Anxiety   . GERD (gastroesophageal reflux disease)     Past Surgical History  Procedure Laterality Date  . Transthoracic echocardiogram  10/26/15    EF 60-65%. GR 1 DD. Aortic root 40 mm, ascending aorta diameter 15 mm. Trivial AI  . Thoracic aortic aneurysm repair N/A 10/28/2015    Procedure: TYPE A AORTIC DISSECTION REPAIR WITH CIRC ARREST;  Surgeon: Ivin Poot, MD;  Location: [facility];  Service: Open Heart Surgery;  Laterality: N/A;  . Tee without cardioversion N/A 10/28/2015    Procedure: TRANSESOPHAGEAL ECHOCARDIOGRAM (TEE);  Surgeon: Ivin Poot, MD;  Location: [facility];  Service: Open Heart Surgery;  Laterality: N/A;  . Transthoracic echocardiogram  01/09/2016    ef 40-45%, Mod Conc LVH. diffuse HK with AKinesis of basal-mid inferior wall. Gr 1 DD.  - c/w prior Inferior MI  . Nm myoview ltd  01/08/2016    (~EF 29 %), LARGE FIXED INFERIO-INFEROLATERAL WALL c/s prior Inferior MI. - Read as HIGH RISK due to LOW EF & Large Infarct  . Rectal  abscess      Prior to Admission medications   Medication Sig Start Date End Date Taking? Authorizing Provider  acetaminophen (TYLENOL) 325 MG tablet Take 2 tablets (650 mg total) by mouth every 4 (four) hours as needed for headache or mild pain. 01/10/16  Yes Erlene Quan, PA-C  aspirin EC 81 MG EC tablet Take 1 tablet (81 mg total) by mouth daily. 01/10/16  Yes Erlene Quan, PA-C  atorvastatin (LIPITOR) 40 MG tablet Take 1 tablet (40 mg total) by mouth daily at 6 PM. 01/10/16  Yes Erlene Quan, PA-C  carvedilol (COREG) 6.25 MG tablet Take 1.5 tablets (9.375 mg total) by mouth 2 (two) times daily with a meal. 01/27/16  Yes Leonie Man, MD  cetirizine (ZYRTEC) 10 MG tablet Take 1 tablet (10 mg total) by mouth daily. 01/06/16  Yes Josalyn Funches, MD  cloNIDine (CATAPRES) 0.2 MG tablet Take 1 tablet (0.2 mg total) by mouth 2 (two) times daily. 02/17/16  Yes Josalyn Funches, MD  fluticasone (FLONASE) 50 MCG/ACT nasal spray Place 2 sprays into both nostrils daily. 01/06/16  Yes Boykin Nearing, MD  furosemide (LASIX)  40 MG tablet Take 40 mg in the morning with an additional 40 mg in the afternoon if weight gain is greater than 3 lbs from previous day 01/20/16  Yes Josalyn Funches, MD  losartan (COZAAR) 50 MG tablet Take 1 tablet (50 mg total) by mouth daily. 01/10/16  Yes Luke K Kilroy, PA-C  Na Sulfate-K Sulfate-Mg Sulf (SUPREP BOWEL PREP KIT) 17.5-3.13-1.6 GM/180ML SOLN Take 1 kit by mouth once. Name brand only, suprep as directed, no substitutions 02/14/16  Yes Milus Banister, MD  nitroGLYCERIN (NITROSTAT) 0.4 MG SL tablet Place 1 tablet (0.4 mg total) under the tongue every 5 (five) minutes x 3 doses as needed for chest pain. 01/10/16  Yes Luke K Kilroy, PA-C  polyethylene glycol (MIRALAX / GLYCOLAX) packet Take 17 g by mouth daily as needed. 12/21/15  Yes Josalyn Funches, MD  potassium chloride SA (K-DUR,KLOR-CON) 20 MEQ tablet 20 mEq by mouth daily with lasix, take twice daily on the days you take an extra  dose of lasix 01/20/16  Yes Boykin Nearing, MD  SYMBICORT 160-4.5 MCG/ACT inhaler Inhale 2 puffs into the lungs daily. 01/20/16  Yes Josalyn Funches, MD  traZODone (DESYREL) 50 MG tablet Take 0.5-1 tablets (25-50 mg total) by mouth at bedtime as needed for sleep. 01/06/16  Yes Boykin Nearing, MD   No Known Allergies   Social History   Social History  . Marital Status: Divorced    Spouse Name: N/A  . Number of Children: N/A  . Years of Education: N/A   Social History Main Topics  . Smoking status: Former Smoker -- 0.50 packs/day for 39 years    Types: Cigarettes    Quit date: 10/25/2015  . Smokeless tobacco: Never Used  . Alcohol Use: 0.0 oz/week    0 Standard drinks or equivalent per week     Comment: OCC  . Drug Use: Yes     Comment: cocaine use feb 2017  . Sexual Activity: Yes   Other Topics Concern  . None   Social History Narrative    Family History  Problem Relation Age of Onset  . Diabetes Father   . Heart disease Father   . Dementia Father   . Colon cancer Neg Hx     Wt Readings from Last 3 Encounters:  02/22/16 252 lb (114.306 kg)  02/17/16 248 lb (112.492 kg)  02/14/16 251 lb (113.853 kg)    PHYSICAL EXAM BP 105/65 mmHg  Pulse 66  Ht '5\' 8"'$  (1.727 m)  Wt 252 lb (114.306 kg)  BMI 38.33 kg/m2  SpO2 97% General appearance: alert, cooperative, appears stated age, no distress and Moderately obese.  He tends to be very verbose and repeats himself quite a bit's. Neck: no adenopathy, no carotid bruit and no JVD Lungs: clear to auscultation bilaterally, normal percussion bilaterally and non-labored Heart: regular rate and rhythm, S1, S2 normal, no murmur, click, rub or gallop; hard to palpate PMI. Abdomen: soft, non-tender; bowel sounds normal; no masses,  no organomegaly; no HJR Extremities: extremities normal, atraumatic, no cyanosis, or edema  Pulses: 2+ and symmetric;  Skin: mobility and turgor normal, no edema and no evidence of bleeding or bruising    Neurologic: Mental status: Alert, oriented, thought content appropriate Cranial nerves: normal (II-XII grossly intact)    Adult ECG Report Not checked  Other studies Reviewed: Additional studies/ records that were reviewed today include:  Recent Labs:   Lab Results  Component Value Date   CHOL 164 01/08/2016   HDL 32*  01/08/2016   LDLCALC 112* 01/08/2016   TRIG 102 01/08/2016   CHOLHDL 5.1 01/08/2016     ASSESSMENT / PLAN: Problem List Items Addressed This Visit    Uncontrolled hypertension    Actually borderline hypotensive today. Hopefully we can come off of clonidine and use more cardio selective medications.      Relevant Medications   atorvastatin (LIPITOR) 40 MG tablet   carvedilol (COREG) 6.25 MG tablet   furosemide (LASIX) 40 MG tablet   losartan (COZAAR) 50 MG tablet   cloNIDine (CATAPRES) 0.2 MG tablet   Preop cardiovascular exam - Primary    PREOPERATIVE CARDIAC RISK ASSESSMENT   Revised Cardiac Risk Index:  High Risk Surgery: no; Intermediate  Defined as Intraperitoneal, intrathoracic or suprainguinal vascular  Active CAD: no; no active angina. Prior infarct without ischemia on recent Myoview  CHF: yes; - but now controlled  Cerebrovascular Disease:; technically NO, but he did have recent CVA related to Aortic Dissection -  Yes  Diabetes: no; On Insulin: no  CKD (Cr >~ 2): no;  Total: 2 Estimated Risk of Adverse Outcome: MODERATE  Estimated Risk of MI, PE, VF/VT (Cardiac Arrest), Complete Heart Block: 6.6 % --> REDUCED TO <3.3% with long term BB dose   ACC/AHA Guidelines for "Clearance":  Step 1 - Need for Emergency Surgery: No:   If Yes - go straight to OR with perioperative surveillance  Step 2 - Active Cardiac Conditions (Unstable Angina, Decompensated HF, Significant  Arrhytmias - Complete HB, Mobitz II, Symptomatic VT or SVT, Severe Aortic Stenosis - mean gradient > 40 mmHg, Valve area < 1.0 cm2):   No: CHF currently compensated  If  Yes - Evaluate & Treat per ACC/AHA Guidelines  Step 3 -  Low Risk Surgery: No: Intermediate  If Yes --> proceed to OR  If No --> Step 4  Step 4 - Functional Capacity >= 4 METS without symptoms: Yes  If Yes --> proceed to OR  If No --> Step 5  Step 5 --  Clinical Risk Factors (CRF)   3 or more: No:   If Yes -- assess Surgical Risk, --   (High Risk Non-cardiac), Intraabdominal or thoracic vascular surgery consider testing if it will change management.  Intermediate Risk: Proceed to OR with HR control, or consider testing if it will change management  1-2 or more CRFs: Yes  If Yes -- assess Surgical Risk, --   (High Risk Non-cardiac), Intraabdominal or thoracic vascular surgery --> Proceed to OR, or consider testing if it will change management.  Intermediate Risk: Proceed to OR with HR control, or consider testing if it will change management  Just had Echo & Myoview - would not need further evaluation in the absence of "active angina" symptoms.  Based on these data, I think he should be fine from a cardiac standpoint proceed with surgery on current medications. Would not pursue any further evaluation. Would defer timing of this operation to Dr. Prescott Gum after reviewing CT scan in the upcoming visit.        PAF (paroxysmal atrial fibrillation) (HCC)    No recurrence noted postoperatively. At this point I think we can try to avoid anticoagulation unless he has another recurrence.      Relevant Medications   atorvastatin (LIPITOR) 40 MG tablet   carvedilol (COREG) 6.25 MG tablet   furosemide (LASIX) 40 MG tablet   losartan (COZAAR) 50 MG tablet   cloNIDine (CATAPRES) 0.2 MG tablet   Osteoarthritis of right hip (  Chronic)    He has been referred to orthopedic surgery. He may need to have hip replacement surgery. I informed him, that I would defer timing of when this would be safe to Dr. Prescott Gum since this was mostly related to his major aortic operation.      Known  aortic dissection, thoracoabdominal April 2016 (Chronic)    Stable postoperatively. Followed by Dr. Prescott Gum. He is due to have a CT scan in the summer time and follow back up with him. As far as preoperative evaluation for hip surgery, I would defer to Dr. Prescott Gum and the stability of his aorta.      Relevant Medications   atorvastatin (LIPITOR) 40 MG tablet   carvedilol (COREG) 6.25 MG tablet   furosemide (LASIX) 40 MG tablet   losartan (COZAAR) 50 MG tablet   cloNIDine (CATAPRES) 0.2 MG tablet   Essential hypertension (Chronic)    Currently well-controlled. Over the next year, we will try to wean off clonidine      Relevant Medications   atorvastatin (LIPITOR) 40 MG tablet   carvedilol (COREG) 6.25 MG tablet   furosemide (LASIX) 40 MG tablet   losartan (COZAAR) 50 MG tablet   cloNIDine (CATAPRES) 0.2 MG tablet   Cocaine abuse-Feb 2017    He adamantly denies having used any since.      Cardiomyopathy, ischemic-40-45% (Chronic)    Presumably, this is related to his perioperative MI. Clearly the Myoview shows an inferior infarct as does the echocardiogram. This goes right along with the inferior ST elevations noted perioperatively. Unfortunately this clearly wanted complications of aortic dissection and unavoidable. Without having active anginal symptoms, I don't really think we need cardiac catheterization, especially with his recent aortic surgery.  He also potentially has some hypertensive heart disease component with difficult control blood pressure. Plan: Continue carvedilol at one half tablets of the 6.25 mg twice a day; continue current dose of losartan for afterload reduction. I would like to finally come off clonidine and increased carvedilol last losartan in follow-up visits.  Discussed sliding scale Lasix in detail (see patient instructions)       Relevant Medications   atorvastatin (LIPITOR) 40 MG tablet   carvedilol (COREG) 6.25 MG tablet   furosemide (LASIX) 40  MG tablet   losartan (COZAAR) 50 MG tablet   potassium chloride SA (K-DUR,KLOR-CON) 20 MEQ tablet   cloNIDine (CATAPRES) 0.2 MG tablet    Other Visit Diagnoses    Chronic systolic heart failure (HCC)        Relevant Medications    atorvastatin (LIPITOR) 40 MG tablet    carvedilol (COREG) 6.25 MG tablet    furosemide (LASIX) 40 MG tablet    losartan (COZAAR) 50 MG tablet    cloNIDine (CATAPRES) 0.2 MG tablet       Current medicines are reviewed at length with the patient today. (+/- concerns) unclear of PRN lasix usage recommendations. The following changes have been made:   Sliding scale Lasix: Weigh yourself when you get home, then Daily in the Morning. Your dry weight will be what your scale says on the day you return home.(here is 252.4 lbs.).   If you gain more than 3 pounds from dry weight: Increase the Lasix dosing to 40 mg in the morning and 40 mg in the afternoon until weight returns to baseline dry weight.  If weight gain is greater than 5 pounds in 2 days: Increased to Lasix 80 mg in the morning and 40 mg  in the evening and contact the office for further assistance if weight does not go down the next day.  If the weight goes down more than 3 pounds from dry weight: Hold Lasix until it returns to baseline dry weight    Medications refilled for 90 days   Wait to have hip surgery until you follow up with Dr Prescott Gum  ( surgeon)  Your physician recommends that you schedule a follow-up appointment in Oct- Nov. 2017 with Dr Ellyn Hack.  Studies Ordered:   No orders of the defined types were placed in this encounter.      Glenetta Hew, M.D., M.S. Interventional Cardiologist   Pager # [phone] Phone # [phone] [address] [place], [place] [address]

## 2016-02-22 NOTE — Patient Instructions (Signed)
Sliding scale Lasix: Weigh yourself when you get home, then Daily in the Morning. Your dry weight will be what your scale says on the day you return home.(here is 252.4 lbs.).   If you gain more than 3 pounds from dry weight: Increase the Lasix dosing to 40 mg in the morning and 40 mg in the afternoon until weight returns to baseline dry weight.  If weight gain is greater than 5 pounds in 2 days: Increased to Lasix 80 mg in the morning and 40 mg in the evening and contact the office for further assistance if weight does not go down the next day.  If the weight goes down more than 3 pounds from dry weight: Hold Lasix until it returns to baseline dry weight    Medications refilled for 90 days   Wait to have hip surgery until you follow up with Dr Donata ClayVan Trigt  ( surgeon)  Your physician recommends that you schedule a follow-up appointment in Oct- Nov. 2017 with Dr Herbie BaltimoreHarding.  If you need a refill on your cardiac medications before your next appointment, please call your pharmacy.

## 2016-02-23 ENCOUNTER — Telehealth: Payer: Self-pay | Admitting: *Deleted

## 2016-02-23 NOTE — Telephone Encounter (Addendum)
Calling for prior auth. For losartan 50 mg one tablet daily Unable to do on covermymeds Spoke representative Question if patient has tried ACE INHIBITOR AND IF SO WHAT?-- NO DOCUMENTED USE WHY LOSARTAN NEEDED? DX CVA 10/2015, MALIGNANT HTN, SURGERY AORTIC DISSECTION- WITH REPAIR 2/ 2017  AND ALSO ISCHEMIC CARDIOMYOPATHY PATIENT IS UNDER CONTROL TAKING LOSARTAN , CLONIDINE, COREG PATIENT IS AFRICAN -[place]  INFORMATION WILL REVIEWED 1-3 DAYS - IF APPROVED WILL BE SENT TO THE PHARMACY  PA# [id number]    TELEPHONE  CONVERSATION CODE # [id number]

## 2016-02-23 NOTE — Telephone Encounter (Signed)
Spoke with pt to explain why colonoscopy procedure had been cancelled and he needed an OV. We reviewed his history and I explained that I had sent Dr Christella HartiganJacobs a note after his previsit appointment to look at his history. Pt was very understanding and agreed that he should wait. Pt saw cardiologist on 6/13 and he wanted him to wait as well. Pt verified his appointment with Dr Christella HartiganJacobs on 8/9. -adm  I had tried to call pt on 6/7 and spoke with a woman who answered, advised her to have him call us back and gave her my direct line, also left a note in rm51 black book to follow-up with pt, it did not look like this was done-adm

## 2016-02-24 ENCOUNTER — Encounter: Payer: Self-pay | Admitting: Cardiology

## 2016-02-24 NOTE — Assessment & Plan Note (Signed)
PREOPERATIVE CARDIAC RISK ASSESSMENT   Revised Cardiac Risk Index:  High Risk Surgery: no; Intermediate  Defined as Intraperitoneal, intrathoracic or suprainguinal vascular  Active CAD: no; no active angina. Prior infarct without ischemia on recent Myoview  CHF: yes; - but now controlled  Cerebrovascular Disease:; technically NO, but he did have recent CVA related to Aortic Dissection -  Yes  Diabetes: no; On Insulin: no  CKD (Cr >~ 2): no;  Total: 2 Estimated Risk of Adverse Outcome: MODERATE  Estimated Risk of MI, PE, VF/VT (Cardiac Arrest), Complete Heart Block: 6.6 % --> REDUCED TO <3.3% with long term BB dose   ACC/AHA Guidelines for "Clearance":  Step 1 - Need for Emergency Surgery: No:   If Yes - go straight to OR with perioperative surveillance  Step 2 - Active Cardiac Conditions (Unstable Angina, Decompensated HF, Significant  Arrhytmias - Complete HB, Mobitz II, Symptomatic VT or SVT, Severe Aortic Stenosis - mean gradient > 40 mmHg, Valve area < 1.0 cm2):   No: CHF currently compensated  If Yes - Evaluate & Treat per ACC/AHA Guidelines  Step 3 -  Low Risk Surgery: No: Intermediate  If Yes --> proceed to OR  If No --> Step 4  Step 4 - Functional Capacity >= 4 METS without symptoms: Yes  If Yes --> proceed to OR  If No --> Step 5  Step 5 --  Clinical Risk Factors (CRF)   3 or more: No:   If Yes -- assess Surgical Risk, --   (High Risk Non-cardiac), Intraabdominal or thoracic vascular surgery consider testing if it will change management.  Intermediate Risk: Proceed to OR with HR control, or consider testing if it will change management  1-2 or more CRFs: Yes  If Yes -- assess Surgical Risk, --   (High Risk Non-cardiac), Intraabdominal or thoracic vascular surgery --> Proceed to OR, or consider testing if it will change management.  Intermediate Risk: Proceed to OR with HR control, or consider testing if it will change management  Just had Echo  & Myoview - would not need further evaluation in the absence of "active angina" symptoms.  Based on these data, I think he should be fine from a cardiac standpoint proceed with surgery on current medications. Would not pursue any further evaluation. Would defer timing of this operation to Dr. Donata ClayVan Torres after reviewing CT scan in the upcoming visit.

## 2016-02-24 NOTE — Assessment & Plan Note (Signed)
He has been referred to orthopedic surgery. He may need to have hip replacement surgery. I informed him, that I would defer timing of when this would be safe to Dr. Donata ClayVan Trigt since this was mostly related to his major aortic operation.

## 2016-02-24 NOTE — Assessment & Plan Note (Signed)
Currently well-controlled. Over the next year, we will try to wean off clonidine

## 2016-02-24 NOTE — Assessment & Plan Note (Signed)
Actually borderline hypotensive today. Hopefully we can come off of clonidine and use more cardio selective medications.

## 2016-02-24 NOTE — Assessment & Plan Note (Signed)
Stable postoperatively. Followed by Dr. Donata ClayVan Trigt. He is due to have a CT scan in the summer time and follow back up with him. As far as preoperative evaluation for hip surgery, I would defer to Dr. Donata ClayVan Trigt and the stability of his aorta.

## 2016-02-24 NOTE — Assessment & Plan Note (Signed)
He adamantly denies having used any since.

## 2016-02-24 NOTE — Assessment & Plan Note (Signed)
Presumably, this is related to his perioperative MI. Clearly the Myoview shows an inferior infarct as does the echocardiogram. This goes right along with the inferior ST elevations noted perioperatively. Unfortunately this clearly wanted complications of aortic dissection and unavoidable. Without having active anginal symptoms, I don't really think we need cardiac catheterization, especially with his recent aortic surgery.  He also potentially has some hypertensive heart disease component with difficult control blood pressure. Plan: Continue carvedilol at one half tablets of the 6.25 mg twice a day; continue current dose of losartan for afterload reduction. I would like to finally come off clonidine and increased carvedilol last losartan in follow-up visits.  Discussed sliding scale Lasix in detail (see patient instructions)

## 2016-02-24 NOTE — Assessment & Plan Note (Signed)
No recurrence noted postoperatively. At this point I think we can try to avoid anticoagulation unless he has another recurrence.

## 2016-02-25 DIAGNOSIS — Z736 Limitation of activities due to disability: Secondary | ICD-10-CM

## 2016-02-25 NOTE — Telephone Encounter (Signed)
SPOKE TO SANDY-- REPRESENTATIVE  AT [place] [facility] [place] APPROVED   NOTIFIED [facility]

## 2016-02-28 ENCOUNTER — Encounter: Payer: Self-pay | Admitting: Gastroenterology

## 2016-03-02 ENCOUNTER — Telehealth: Payer: Self-pay | Admitting: Family Medicine

## 2016-03-02 DIAGNOSIS — M1611 Unilateral primary osteoarthritis, right hip: Secondary | ICD-10-CM

## 2016-03-02 MED ORDER — ACETAMINOPHEN-CODEINE #3 300-30 MG PO TABS
1.0000 | ORAL_TABLET | Freq: Three times a day (TID) | ORAL | Status: DC | PRN
Start: 2016-03-02 — End: 2016-03-17

## 2016-03-02 NOTE — Telephone Encounter (Signed)
Called to [facility] Batten[facility] gave VO to re-certify  She is passing on that patient is having chronic R hip pain, tylenol #3 prescribed.  I called patient to let him know that tylenol #3 Rx up front for pick up for his R hip pain. He informed that the was advised by his cardiologist to postpone any intervention on his hip until 04/2016.

## 2016-03-02 NOTE — Telephone Encounter (Signed)
Mauricio PoKim McCay, an RN with [facility] is calling for a verbal order for a re-certification for skilled nursing Please call back  Thank you.

## 2016-03-07 MED FILL — ACETAMINOPHEN/COD #3 TABLET: 300-30 | 20 days supply | Qty: 60 | Fill #0

## 2016-03-08 NOTE — Telephone Encounter (Signed)
Erroneous encounter

## 2016-03-09 ENCOUNTER — Ambulatory Visit (INDEPENDENT_AMBULATORY_CARE_PROVIDER_SITE_OTHER): Payer: Medicaid Other | Admitting: Internal Medicine

## 2016-03-09 ENCOUNTER — Encounter: Payer: Self-pay | Admitting: Internal Medicine

## 2016-03-09 ENCOUNTER — Encounter (INDEPENDENT_AMBULATORY_CARE_PROVIDER_SITE_OTHER): Payer: Self-pay

## 2016-03-09 ENCOUNTER — Telehealth: Payer: Self-pay | Admitting: Family Medicine

## 2016-03-09 DIAGNOSIS — J449 Chronic obstructive pulmonary disease, unspecified: Secondary | ICD-10-CM | POA: Diagnosis not present

## 2016-03-09 MED ORDER — BUDESONIDE-FORMOTEROL FUMARATE 160-4.5 MCG/ACT IN AERO: INHALATION_SPRAY | RESPIRATORY_TRACT | Status: AC

## 2016-03-09 NOTE — Patient Instructions (Signed)
Ok to just use symbicort 160 up to every 12 hours as needed   If you are satisfied with your treatment plan,  let your doctor know and he/she can either refill your medications or you can return here when your prescription runs out.     If in any way you are not 100% satisfied,  please tell us.  If 100% better, tell your friends!  Pulmonary follow up is as needed

## 2016-03-09 NOTE — Progress Notes (Signed)
Subjective:    Patient ID: [name], male    DOB: 03/19/1961,    MRN: [id number]    Brief patient profile:  55 yobm quit smoking 10/2015 @ wt = 241 at T aneurysm surgery more limited by R  hip and than breathing at baseline but slept on several pillows/ some mild doe x steps  but post op has not recovered back to baseline activity tol and has to sleep in a recliner so referred to pulmonary clinic 02/09/2016 by Dr Armen PickupFunches   02/09/2016 1st [facility] office visit/ Camia Dipinto   Chief Complaint  Patient presents with  . Pulmonary Consult    Referred by Dr. Dessa PhiJosalyn Funches for COPD eval. Pt c/o SOB since Feb 2017-symbicort has helped.   when tries to tilt recliner more flat x 10-5815min sob  Breathing daytime better on symbicort 160 x 2 each am but hfa poor and not clear it really helped but no longer having sob at rest unless trying to lie flat Ex limited by him pain now, not sob rec Please see patient coordinator before you leave today  to schedule sniff test  Symbicort 160 Take 2 puffs first thing in am and then another 2 puffs about 12 hours later.  Work on inhaler technique:       03/09/2016  f/u ov/Zebadiah Willert re: COPD 0 / symbicort 160 2bid not consistently  Chief Complaint  Patient presents with  . Follow-up    Breathing is overall doing well. No new co's today.   limited by hip/ still sleeping in recliner but not due to breathing No change on symbicort vs off / no urge to smoke anymore  No obvious day to day or daytime variability or assoc excess/ purulent sputum or mucus plugs or hemoptysis or cp or chest tightness, subjective wheeze or overt sinus or hb symptoms. No unusual exp hx or h/o childhood pna/ asthma or knowledge of premature birth.  Sleeping ok without nocturnal  or early am exacerbation  of respiratory  c/o's or need for noct saba. Also denies any obvious fluctuation of symptoms with weather or environmental changes or other aggravating or alleviating factors except as  outlined above   Current Medications, Allergies, Complete Past Medical History, Past Surgical History, Family History, and Social History were reviewed in [facility] electronic medical record.  ROS  The following are not active complaints unless bolded sore throat, dysphagia, dental problems, itching, sneezing,  nasal congestion or excess/ purulent secretions, ear ache,   fever, chills, sweats, unintended wt loss, classically pleuritic or exertional cp,  orthopnea pnd or leg swelling, presyncope, palpitations, abdominal pain, anorexia, nausea, vomiting, diarrhea  or change in bowel or bladder habits, change in stools or urine, dysuria,hematuria,  rash, arthralgias, visual complaints, headache, numbness, weakness or ataxia or problems with walking or coordination,  change in mood/affect or memory.                Objective:   Physical Exam amb obese bm   03/09/2016       252   02/09/16 248 lb (112.492 kg)  02/04/16 242 lb (109.77 kg)  01/20/16 242 lb (109.77 kg)    Vital signs reviewed    HEENT: nl dentition, turbinates, and oropharynx. Nl external ear canals without cough reflex   NECK :  without JVD/Nodes/TM/ nl carotid upstrokes bilaterally   LUNGS: no acc muscle use,  Nl contour chest which is clear to A and P bilaterally without cough on insp or  exp maneuvers   CV:  RRR  no s3 or murmur or increase in P2, no edema   ABD:  soft and nontender with nl inspiratory excursion in the supine position. No bruits or organomegaly, bowel sounds nl  MS:  Nl gait/ ext warm without deformities, calf tenderness, cyanosis or clubbing No obvious joint restrictions   SKIN: warm and dry without lesions    NEURO:  alert, approp, nl sensorium with  no motor deficits      I personally reviewed images and agree with radiology impression as follows:  CXR:  02/04/16  Normal sized heart. Stable linear scarring in the left lower lung zone. Clear right lung. Stable median sternotomy wires  and mediastinal surgical clips. Mildly elevated left hemidiaphragm with mild progression. Mild thoracic spine degenerative changes.         Assessment & Plan:

## 2016-03-09 NOTE — Telephone Encounter (Signed)
[name] from [facility] called to let pt. PCP know that [name] outside yesterday But had no injuries.

## 2016-03-09 NOTE — Assessment & Plan Note (Signed)
Quit smoking 10/2015 Spirometry 02/09/2016  FEV1 1.91  (62%)  Ratio 72 - 02/09/2016  After extensive coaching HFA effectiveness =    75% from baseline of < 25% s symbicort am of ov      -03/09/2016  After extensive coaching HFA effectiveness =    75%  > changed to prn symbicort 160 and pulmonary f/u prn   I had an extended final summary discussion with the patient reviewing all relevant studies completed to date and  lasting 15 to 20 minutes of a 25 minute visit on the following issues:    1)   I reviewed the Fletcher curve with the patient that basically indicates  if you quit smoking when your best day FEV1 is still well preserved (as is clearly  the case here)  it is highly unlikely you will progress to severe disease and informed the patient there was no medication on the market that has proven to alter the curve/ its downward trajectory  or the likelihood of progression of their disease.  Therefore stopping smoking and maintaining abstinence is the most important aspect of care, not choice of inhalers or for that matter, doctors.    2) symbicort and pulmoanry f/u are both prn  3) Each maintenance medication was reviewed in detail including most importantly the difference between maintenance and as needed and under what circumstances the prns are to be used.  Please see instructions for details which were reviewed in writing and the patient given a copy.

## 2016-03-09 NOTE — Telephone Encounter (Signed)
Called pt. Pt verified name and date of birth. Pt reports he feels fine and did not have any injuries from the fall.

## 2016-03-17 ENCOUNTER — Encounter: Payer: Self-pay | Admitting: Family Medicine

## 2016-03-17 ENCOUNTER — Ambulatory Visit: Payer: Medicaid Other | Attending: Family Medicine | Admitting: Family Medicine

## 2016-03-17 VITALS — BP 111/72 | HR 67 | Temp 97.7°F | Ht 68.0 in | Wt 257.8 lb

## 2016-03-17 DIAGNOSIS — Z79899 Other long term (current) drug therapy: Secondary | ICD-10-CM | POA: Diagnosis not present

## 2016-03-17 DIAGNOSIS — Z8673 Personal history of transient ischemic attack (TIA), and cerebral infarction without residual deficits: Secondary | ICD-10-CM | POA: Diagnosis not present

## 2016-03-17 DIAGNOSIS — Z7982 Long term (current) use of aspirin: Secondary | ICD-10-CM | POA: Insufficient documentation

## 2016-03-17 DIAGNOSIS — I10 Essential (primary) hypertension: Secondary | ICD-10-CM | POA: Diagnosis present

## 2016-03-17 DIAGNOSIS — M1611 Unilateral primary osteoarthritis, right hip: Secondary | ICD-10-CM

## 2016-03-17 DIAGNOSIS — Z87891 Personal history of nicotine dependence: Secondary | ICD-10-CM | POA: Insufficient documentation

## 2016-03-17 MED ORDER — ASPIRIN 81 MG PO TBEC: 81.0000 mg | DELAYED_RELEASE_TABLET | Freq: Every day | ORAL | Status: AC

## 2016-03-17 MED ORDER — ACETAMINOPHEN-CODEINE #3 300-30 MG PO TABS: 1.0000 | ORAL_TABLET | Freq: Three times a day (TID) | ORAL | Status: AC | PRN

## 2016-03-17 NOTE — Progress Notes (Signed)
Subjective:  Patient ID: [name], male    DOB: Jul 29, 1961  Age: 55 y.o. MRN: [id number]  CC: Hip Pain   HPI [name] has HTN, hx of stroke, hx of thoracic aorta dissection s/p repair 10/2015 who presents for   1. DJD R hip: he has known severe DJD. Last x-ray 02/2015.  He has been advised by his orthopaedic surgeon to wait at least 6 months since his aortic dissection repair before he has any surgical interventions. Pain is exacerbated by walking. Sitting and standing still are pain free. Pain is lateral hip. He takes two tylenol #3 in AM. He takes 1 in the afternoon. He reports that sometimes he takes 4 OTC aleve which relieves his pain completely for 1 hour.   Social History  Substance Use Topics  . Smoking status: Former Smoker -- 0.50 packs/day for 39 years    Types: Cigarettes    Quit date: 10/25/2015  . Smokeless tobacco: Never Used  . Alcohol Use: 1.2 - 1.8 oz/week    0 Standard drinks or equivalent, 2-3 Glasses of wine per week     Comment: [facility]    Outpatient Prescriptions Prior to Visit  Medication Sig Dispense Refill  . acetaminophen-codeine (TYLENOL #3) 300-30 MG tablet Take 1 tablet by mouth every 8 (eight) hours as needed for moderate pain. 60 tablet 0  . atorvastatin (LIPITOR) 40 MG tablet Take 1 tablet (40 mg total) by mouth daily at 6 PM. 90 tablet 3  . budesonide-formoterol (SYMBICORT) 160-4.5 MCG/ACT inhaler Take 2 puffs first thing in am and then another 2 puffs about 12 hours later. 1 Inhaler 11  . carvedilol (COREG) 6.25 MG tablet Take 1.5 tablets (9.375 mg total) by mouth 2 (two) times daily with a meal. 270 tablet 3  . cetirizine (ZYRTEC) 10 MG tablet Take 1 tablet (10 mg total) by mouth daily. 30 tablet 11  . cloNIDine (CATAPRES) 0.2 MG tablet Take 1 tablet (0.2 mg total) by mouth 2 (two) times daily. 180 tablet 3  . furosemide (LASIX) 40 MG tablet Take 40 mg in the morning with an additional 40 mg in the afternoon if weight gain is greater than 3  lbs from previous day 180 tablet 3  . losartan (COZAAR) 50 MG tablet Take 1 tablet (50 mg total) by mouth daily. 90 tablet 3  . polyethylene glycol (MIRALAX / GLYCOLAX) packet Take 17 g by mouth daily as needed. 30 each 5  . potassium chloride SA (K-DUR,KLOR-CON) 20 MEQ tablet 20 mEq by mouth daily with lasix, take twice daily on the days you take an extra dose of lasix 180 tablet 3  . acetaminophen (TYLENOL) 325 MG tablet Take 2 tablets (650 mg total) by mouth every 4 (four) hours as needed for headache or mild pain. (Patient not taking: Reported on 03/17/2016)    . aspirin EC 81 MG EC tablet Take 1 tablet (81 mg total) by mouth daily. (Patient not taking: Reported on 03/17/2016)    . fluticasone (FLONASE) 50 MCG/ACT nasal spray Place 2 sprays into both nostrils daily. (Patient not taking: Reported on 03/17/2016) 16 g 6  . Na Sulfate-K Sulfate-Mg Sulf (SUPREP BOWEL PREP KIT) 17.5-3.13-1.6 GM/180ML SOLN Take 1 kit by mouth once. Name brand only, suprep as directed, no substitutions (Patient not taking: Reported on 03/17/2016) 354 mL 0  . nitroGLYCERIN (NITROSTAT) 0.4 MG SL tablet Place 1 tablet (0.4 mg total) under the tongue every 5 (five) minutes x 3 doses as needed  for chest pain. (Patient not taking: Reported on 03/17/2016) 25 tablet 2  . traZODone (DESYREL) 50 MG tablet Take 0.5-1 tablets (25-50 mg total) by mouth at bedtime as needed for sleep. (Patient not taking: Reported on 03/17/2016) 30 tablet 3   No facility-administered medications prior to visit.    ROS Review of Systems  Constitutional: Negative for fever, chills, fatigue and unexpected weight change.  Eyes: Negative for visual disturbance.  Respiratory: Negative for cough and shortness of breath.   Cardiovascular: Negative for chest pain, palpitations and leg swelling.  Gastrointestinal: Negative for nausea, vomiting, abdominal pain, diarrhea, constipation and blood in stool.  Endocrine: Negative for polydipsia, polyphagia and polyuria.    Musculoskeletal: Positive for arthralgias (R hip ). Negative for myalgias, back pain, gait problem and neck pain.  Skin: Negative for rash.  Allergic/Immunologic: Negative for immunocompromised state.  Hematological: Negative for adenopathy. Does not bruise/bleed easily.  Psychiatric/Behavioral: Negative for suicidal ideas, sleep disturbance and dysphoric mood. The patient is not nervous/anxious.     Objective:  BP 111/72 mmHg  Pulse 67  Temp(Src) 97.7 F (36.5 C) (Oral)  Ht '5\' 8"'$  (1.727 m)  Wt 257 lb 12.8 oz (116.937 kg)  BMI 39.21 kg/m2  SpO2 96%  BP/Weight 03/17/2016 03/09/2016 6/60/6301  Systolic BP 601 093 235  Diastolic BP 72 74 65  Wt. (Lbs) 257.8 252 252  BMI 39.21 38.33 38.33   Physical Exam  Constitutional: He appears well-developed and well-nourished. No distress.  Obese   HENT:  Head: Normocephalic and atraumatic.  Neck: Normal range of motion. Neck supple.  Cardiovascular: Normal rate, regular rhythm, normal heart sounds and intact distal pulses.   Pulmonary/Chest: Effort normal and breath sounds normal.  Musculoskeletal: He exhibits no edema.       Right hip: He exhibits decreased range of motion. He exhibits normal strength, no tenderness, no bony tenderness, no swelling, no crepitus, no deformity and no laceration.  Pain and decreased ROM in R lateral hip   Neurological: He is alert.  Skin: Skin is warm and dry. No rash noted. No erythema.  Psychiatric: He has a normal mood and affect.     Assessment & Plan:   There are no diagnoses linked to this encounter. [name] was seen today for hip pain.  Diagnoses and all orders for this visit:  Osteoarthritis of right hip, unspecified osteoarthritis type -     acetaminophen-codeine (TYLENOL #3) 300-30 MG tablet; Take 1 tablet by mouth every 8 (eight) hours as needed for moderate pain. -     Ambulatory referral to Physical Therapy  History of CVA -embolic Feb 5732 -     aspirin 81 MG EC tablet; Take 1 tablet  (81 mg total) by mouth daily.   Meds ordered this encounter  Medications  . acetaminophen-codeine (TYLENOL #3) 300-30 MG tablet    Sig: Take 1 tablet by mouth every 8 (eight) hours as needed for moderate pain.    Dispense:  90 tablet    Refill:  0  . aspirin 81 MG EC tablet    Sig: Take 1 tablet (81 mg total) by mouth daily.    Dispense:  90 tablet    Refill:  2    Follow-up: No Follow-up on file.   Boykin Nearing MD

## 2016-03-17 NOTE — Progress Notes (Signed)
Right sided hip pain- 10/10

## 2016-03-17 NOTE — Assessment & Plan Note (Signed)
Severe DJD in R hip  Plan: Continue tylenol #3 Add PT  Add cane Patient to f/u with ortho next month which is 6 months s/p thoracic aortic dissection rupture repair to plan surgical intervention

## 2016-03-17 NOTE — Patient Instructions (Addendum)
[name] was seen today for hip pain.  Diagnoses and all orders for this visit:  Osteoarthritis of right hip, unspecified osteoarthritis type -     acetaminophen-codeine (TYLENOL #3) 300-30 MG tablet; Take 1 tablet by mouth every 8 (eight) hours as needed for moderate pain. -     Ambulatory referral to Physical Therapy  History of CVA -embolic Feb 2017 -     aspirin 81 MG EC tablet; Take 1 tablet (81 mg total) by mouth daily.   F.u in 1 months for R hip pain   Dr. Armen PickupFunches

## 2016-03-21 ENCOUNTER — Other Ambulatory Visit: Payer: Self-pay | Admitting: *Deleted

## 2016-03-21 DIAGNOSIS — I71019 Dissection of thoracic aorta, unspecified: Secondary | ICD-10-CM

## 2016-03-21 DIAGNOSIS — I7101 Dissection of thoracic aorta: Secondary | ICD-10-CM

## 2016-03-23 ENCOUNTER — Ambulatory Visit: Payer: Self-pay | Admitting: Physical Therapy

## 2016-03-24 ENCOUNTER — Encounter: Payer: Self-pay | Admitting: Vascular Surgery

## 2016-03-27 NOTE — Progress Notes (Signed)
This encounter did not occur on the scheduled date. This encounter was created in error - please disregard. 

## 2016-03-28 ENCOUNTER — Ambulatory Visit: Payer: Medicaid Other | Admitting: Physical Therapy

## 2016-03-31 ENCOUNTER — Encounter: Payer: Medicaid Other | Admitting: Vascular Surgery

## 2016-04-04 ENCOUNTER — Ambulatory Visit: Payer: Medicaid Other | Attending: Family Medicine | Admitting: Physical Therapy

## 2016-04-04 DIAGNOSIS — M25551 Pain in right hip: Secondary | ICD-10-CM | POA: Diagnosis present

## 2016-04-04 DIAGNOSIS — M25651 Stiffness of right hip, not elsewhere classified: Secondary | ICD-10-CM | POA: Insufficient documentation

## 2016-04-04 DIAGNOSIS — M6281 Muscle weakness (generalized): Secondary | ICD-10-CM | POA: Diagnosis present

## 2016-04-04 DIAGNOSIS — R262 Difficulty in walking, not elsewhere classified: Secondary | ICD-10-CM | POA: Diagnosis present

## 2016-04-04 NOTE — Therapy (Addendum)
[facility] [address] [place], [place], [address] Phone: [phone]   Fax:  [phone]  Physical Therapy Evaluation/Medicaid one time evaluation  Patient Details  Name: [name] MRN: [id number] Date of Birth: Dec 21, 1960 Referring Provider: Bernadette Hoit MD  Encounter Date: 04/04/2016      PT End of Session - 04/04/16 0753    Visit Number 1   Number of Visits 1   Date for PT Re-Evaluation 04/04/16   Authorization Type Medicaid   PT Start Time 0752   PT Stop Time 0846   PT Time Calculation (min) 54 min   Activity Tolerance Patient tolerated treatment well   Behavior During Therapy [facility] for tasks assessed/performed      Past Medical History:  Diagnosis Date  . Anxiety   . Aortic dissection, thoracic (HCC) 10/28/2015  . Chronic combined systolic and diastolic CHF, NYHA class 2 (HCC)    as a result of Aortic Dissection  . COPD (chronic obstructive pulmonary disease) (HCC)   . CVA (cerebral infarction) 10/2015  . GERD (gastroesophageal reflux disease)   . GERD (gastroesophageal reflux disease)   . Hyperlipidemia   . Ischemic cardiomyopathy 10/2015   EF now 40-45% by Echo (~29% by Myoview)   . Malignant hypertension Dx 2016  . Myocardial infarction, inferior wall (HCC) 10/2015   Peri-Operative Inferior STEMI, presumably related to air embolism. -- Inferior Akinesis on Echo & Large Inferior-Inferolateral Infarct on Myoview  . Seasonal allergies   . Stroke with cerebral ischemia (HCC) 10/25/15   as a result of Aortic Dissection    Past Surgical History:  Procedure Laterality Date  . NM MYOVIEW LTD  01/08/2016   (~EF 29 %), LARGE FIXED INFERIO-INFEROLATERAL WALL c/s prior Inferior MI. - Read as HIGH RISK due to LOW EF & Large Infarct  . Rectal abscess    . TEE WITHOUT CARDIOVERSION N/A 10/28/2015   Procedure: TRANSESOPHAGEAL ECHOCARDIOGRAM (TEE);  Surgeon: Kerin Perna, MD;  Location: [facility] OR;  Service: Open Heart  Surgery;  Laterality: N/A;  . THORACIC AORTIC ANEURYSM REPAIR N/A 10/28/2015   Procedure: TYPE A AORTIC DISSECTION REPAIR WITH CIRC ARREST;  Surgeon: Kerin Perna, MD;  Location: [facility] OR;  Service: Open Heart Surgery;  Laterality: N/A;  . TRANSTHORACIC ECHOCARDIOGRAM  10/26/15   EF 60-65%. GR 1 DD. Aortic root 40 mm, ascending aorta diameter 15 mm. Trivial AI  . TRANSTHORACIC ECHOCARDIOGRAM  01/09/2016   ef 40-45%, Mod Conc LVH. diffuse HK with AKinesis of basal-mid inferior wall. Gr 1 DD.  - c/w prior Inferior MI    There were no vitals filed for this visit.       Subjective Assessment - 04/04/16 0800    Subjective I saw a PT in the hospital and I was given some exercises in inpatient rehab post thoraco aortic dissection.  I did have an injection in the right hip but it only lasted 3 days    Pertinent History Aortic Dissection thoraco lumbar October 26 2015,  inpatient Rehab 2017.  I was in orientation for future hip surgery, CVA 2017, COPD, GERD , Hip OA    Limitations Walking   How long can you sit comfortably? unlimited   How long can you stand comfortably? 5 minutes   How long can you walk comfortably? 5 minutes    Diagnostic tests xray   Patient Stated Goals Home program for    Currently in Pain? Yes   Pain Score 7    Pain  Location Hip   Pain Orientation Right   Pain Descriptors / Indicators Aching;Constant   Pain Type Chronic pain   Pain Onset More than a month ago   Pain Frequency Constant   Aggravating Factors  walking too long, standing too long   Pain Relieving Factors Aleve            [facility] PT Assessment - 04/04/16 0814      Assessment   Medical Diagnosis  osteoarthritis of  right hip   Referring Provider Bernadette Hoit MD   Onset Date/Surgical Date 04/04/14   Hand Dominance Right   Prior Therapy just inptatient post aortic dissection     Precautions   Precautions Fall  Pt instructed to use cane for safetyor a walker at home     Restrictions   Weight  Bearing Restrictions No     Balance Screen   Has the patient fallen in the past 6 months Yes   How many times? 1  coming down a step   Has the patient had a decrease in activity level because of a fear of falling?  Yes   Is the patient reluctant to leave their home because of a fear of falling?  No     Home Environment   Living Environment Private residence   Living Arrangements Parent   Type of Home House   Home Access Stairs to enter   Entrance Stairs-Number of Steps 4   Entrance Stairs-Rails Can reach both   Home Layout One level     Prior Function   Level of Independence Independent     Cognition   Overall Cognitive Status Within Functional Limits for tasks assessed     Observation/Other Assessments   Focus on Therapeutic Outcomes (FOTO)  NOt taken due to one time eval      Sensation   Light Touch Appears Intact     Posture/Postural Control   Posture/Postural Control Postural limitations   Postural Limitations Rounded Shoulders;Forward head;Anterior pelvic tilt     AROM   Right Hip Extension 5   Right Hip Flexion 90  pain   Right Hip External Rotation  17  pain   Right Hip Internal Rotation  15  pain   Left Hip Extension 15   Left Hip Flexion 105   Left Hip External Rotation  37   Left Hip Internal Rotation  25     Strength   Overall Strength Deficits   Overall Strength Comments Pt stiffness dominin   Right Hip Flexion 4-/5   Right Hip Extension 3-/5   Right Hip ABduction 4-/5   Left Hip Flexion 4+/5   Left Hip Extension 4/5   Left Hip ABduction 4/5   Right Knee Flexion 4/5   Right Knee Extension 4/5   Left Knee Flexion 4+/5   Left Knee Extension 4+/5     Flexibility   Hamstrings Left 70, right 55 pain  hip flexor tight ness bil and piriformis     Ambulation/Gait   Ambulation/Gait Yes   Ambulation/Gait Assistance 6: Modified independent (Device/Increase time)   Ambulation Distance (Feet) 200 Feet   Assistive device Straight cane  Pt has a  walker and is getting a cane   Gait Pattern Antalgic   Ambulation Surface Level   Gait velocity 2.11 ft/sec   Stairs Yes   Stairs Assistance 6: Modified independent (Device/Increase time)   Stair Management Technique Step to pattern   Number of Stairs 8   Height of Stairs 6  Gait Comments Pt instructed in heel toe and proper gait                   [facility] Adult PT Treatment/Exercise - 04/04/16 0814      Knee/Hip Exercises: Stretches   Active Hamstring Stretch Right;2 reps;60 seconds   Active Hamstring Stretch Limitations use of towel to assist    Hip Flexor Stretch Right;2 reps;60 seconds   Hip Flexor Stretch Limitations pain limiting.   Piriformis Stretch 2 reps;Right;60 seconds   Piriformis Stretch Limitations use of towel to help stretch     Knee/Hip Exercises: Standing   Other Standing Knee Exercises 4 way standing SLR with VC for proper execution x 10 each     Knee/Hip Exercises: Supine   Bridges 10 reps;Both;Strengthening;2 sets   Bridges Limitations poor motor control                PT Education - 04/04/16 0856    Education provided Yes   Education Details Evaluation , explanation of finding.  Gait with cane on steps and level surface, [facility] info and Initial HEP for pre hab of hip before possible THR   Person(s) Educated Patient   Methods Explanation;Demonstration;Tactile cues;Verbal cues;Handout   Comprehension Verbalized understanding;Returned demonstration             PT Long Term Goals - 04/04/16 0903      PT LONG TERM GOAL #1   Title Pt will be instructed in proper gait using cane on level surface and steps   Time 1   Period Days   Status Achieved     PT LONG TERM GOAL #2   Title Pt will be educated on intial HEP for pre hab for possible future right hip THR.  Pt with OA now limiting movement/mobility   Time 1   Period Days   Status Achieved               Plan - 04/04/16 0858    Clinical Impression Statement 55  yo with multiple medical comorbidities such as thoracic aortic dissection 10/2015, CVA, COPD, GERD,presents with deficits and OA or Right hip.  Pt with difficulty walking,decreased strength and ROM  and abnormal posture and gait presents with mod complexity evaluation and desired a good home program to prepare for possible future THR surgery on Right hip.  Pt was informed of financial assistance and [facility] pro bono clinic.  Pt  is allowed only one eval/visit by Medicaid and pt did not wish to continue self pay due to financial limitations.   Rehab Potential Good   PT Frequency One time visit   PT Treatment/Interventions Gait training;Therapeutic exercise;Patient/family education   PT Next Visit Plan DC  one time visit due to Medicaid   Recommended Other Services financial assistance and [facility] Pro bono clinic   Consulted and Agree with Plan of Care Patient      Patient will benefit from skilled therapeutic intervention in order to improve the following deficits and impairments:  Abnormal gait, Pain, Hypomobility, Decreased strength, Decreased range of motion, Decreased mobility, Improper body mechanics, Postural dysfunction, Decreased activity tolerance, Difficulty walking  Visit Diagnosis: Pain in right hip  Difficulty in walking, not elsewhere classified  Muscle weakness (generalized)  Stiffness of right hip, not elsewhere classified     Problem List Patient Active Problem List   Diagnosis Date Noted  . Preop cardiovascular exam 02/22/2016  . COPD GOLD 0  02/09/2016  . Morbid obesity due to  excess calories (HCC) 02/09/2016  . SOB (shortness of breath) 01/20/2016  . Cardiomyopathy, ischemic-40-45% 01/10/2016  . Uncontrolled hypertension 01/10/2016  . PAF (paroxysmal atrial fibrillation) (HCC) 01/10/2016  . Pain in the chest   . Elevated brain natriuretic peptide (BNP) level 01/07/2016  . Dyspnea 01/07/2016  . Orthopnea 01/06/2016  . Seasonal allergies 01/06/2016  . Insomnia  01/06/2016  . Constipation due to pain medication 12/21/2015  . Cutaneous skin tags 12/21/2015  . Generalized anxiety disorder 11/25/2015  . History of CVA -embolic Feb 2017 11/05/2015  . Left homonymous hemianopsia   . Aortic dissection, thoracic- surgery Feb 2017 10/28/2015  . Cerebral thrombosis with cerebral infarction 10/26/2015  . Osteoarthritis of right hip 03/01/2015  . Known aortic dissection, thoracoabdominal April 2016 03/01/2015  . Vitamin D deficiency 01/28/2015  . Nonspecific abnormal electrocardiogram (ECG) (EKG) 01/26/2015  . Benign paroxysmal positional vertigo 01/12/2015  . Alcohol abuse 01/07/2015  . Cocaine abuse-Feb 2017 01/07/2015  . Essential hypertension     Garen Lah, PT 04/04/16 9:08 AM Phone: [phone] Fax: [phone]  [facility] Outpatient Rehabilitation [facility] [address] [place], [place], [address] Phone: [phone]   Fax:  [phone]  Name: [name] MRN: [id number] Date of Birth: Mar 03, 1961

## 2016-04-04 NOTE — Patient Instructions (Signed)
Knee High  May do exericises with red tband or weights   Holding stable object, raise knee to hip level, then lower knee. Repeat with other knee. Complete __10_ repetitions. Do __2__ sessions per day.  ABDUCTION: Standing (Active)   Stand, feet flat. Lift right leg out to side. Use _2__ lbs. Or t band red Complete __10_ repetitions. Perform __2_ sessions per day.  ADDUCTION: Standing - Stable (Active)   Stand, right leg out to side as far as possible. Draw leg in across midline. Use _2__ lbs. Complete 10_ repetitions. Perform _2__ sessions per day.       EXTENSION: Standing (Active)  Stand, both feet flat. Draw right leg behind body as far as possible. Use 2___ lbs. Complete 10 repetitions. Perform __2_ sessions per day.  Copyright  VHI. All rights reserved.   Leg Extension (Hamstring)   Sit toward front edge of chair, with leg out straight, heel on floor, toes pointing toward body. Keeping back straight, bend forward at hip, breathing out through pursed lips. Return, breathing in.  Remember [name] the baby , back straight as you bend Repeat 2-3___ times. Repeat with other leg. Do _1-2__ sessions per day. Hold for 30 to 60 seconds  Hamstring Step 1   Straighten left knee. Keep knee level with other knee or on bolster. Hold _60 __ seconds. Relax knee by returning foot to start. Repeat _2__ times.   Hip Stretch  Try this as you begin to bend your hip better  Put right ankle over left knee. Let right knee fall downward, but keep ankle in place. Feel the stretch in hip. May push down gently with hand to feel stretch. Hold ____ seconds while counting out loud. Repeat with other leg. Repeat _2-3___ times. Do __1-2__ sessions per day.    Stretching: Piriformis (Supine)  Pull right knee toward opposite shoulder. Hold 30-60____ seconds. Relax. Repeat __2-3__ times per set. . Do1-2 ____ sessions per day.       HIP: Flexors - Supine   Lie on edge of surface. Place   Right leg off the surface, allow knee to bend. Bring other knee toward chest. Hold 30-60___ seconds. _7__ days per week Rest lowered foot on stool.    Quads / HF, Standing  Copyright  VHI. All rights reserved.   Bridge    Lie back, legs bent. Inhale, pressing hips up. Keeping ribs in, lengthen lower back. Exhale, rolling down along spine from top. Repeat _10 x 2___ times. Do _1-2___ sessions per day.  http://pm.exer.us/54   Copyright  VHI. All rights reserved.   Pt given handout on Hip strengthening from [facility] for progression and information about [facility] pro bono clinic in [place], [place] 04/04/16 8:34 AM Phone: [phone] Fax: [phone]

## 2016-04-06 MED FILL — ACETAMINOPHEN/COD #3 TABLET: 300-30 | 30 days supply | Qty: 90 | Fill #0

## 2016-04-19 ENCOUNTER — Ambulatory Visit: Payer: Self-pay | Admitting: Gastroenterology

## 2016-05-03 ENCOUNTER — Encounter: Payer: Self-pay | Admitting: Cardiothoracic Surgery

## 2016-05-03 ENCOUNTER — Ambulatory Visit (INDEPENDENT_AMBULATORY_CARE_PROVIDER_SITE_OTHER): Payer: Medicaid Other | Admitting: Cardiothoracic Surgery

## 2016-05-03 ENCOUNTER — Ambulatory Visit
Admission: RE | Admit: 2016-05-03 | Discharge: 2016-05-03 | Disposition: A | Discharge status: Home / Self Care | Payer: Medicaid Other | Source: Ambulatory Visit | Attending: Cardiothoracic Surgery | Admitting: Cardiothoracic Surgery

## 2016-05-03 ENCOUNTER — Other Ambulatory Visit: Payer: Self-pay | Admitting: *Deleted

## 2016-05-03 VITALS — BP 111/71 | HR 73 | Resp 20 | Ht 68.0 in | Wt 257.0 lb

## 2016-05-03 DIAGNOSIS — I71019 Dissection of thoracic aorta, unspecified: Secondary | ICD-10-CM

## 2016-05-03 DIAGNOSIS — I7101 Dissection of thoracic aorta: Secondary | ICD-10-CM

## 2016-05-03 DIAGNOSIS — M25551 Pain in right hip: Secondary | ICD-10-CM

## 2016-05-03 DIAGNOSIS — I5030 Unspecified diastolic (congestive) heart failure: Secondary | ICD-10-CM | POA: Diagnosis not present

## 2016-05-03 DIAGNOSIS — Z9889 Other specified postprocedural states: Secondary | ICD-10-CM | POA: Diagnosis not present

## 2016-05-03 MED ORDER — IOPAMIDOL (ISOVUE-370) INJECTION 76%
75.0000 mL | Freq: Once | INTRAVENOUS | Status: AC | PRN
  Administered 2016-05-03: 75 mL via INTRAVENOUS

## 2016-05-03 MED ORDER — DICLOFENAC SODIUM 1 % TD GEL: 2.0000 g | Freq: Two times a day (BID) | TRANSDERMAL | Status: AC

## 2016-05-03 NOTE — Progress Notes (Signed)
PCP is Minerva Ends, MD Referring Provider is Lacretia Leigh, MD  Chief Complaint  Patient presents with  . Follow-up    3 month f/u with CTA Chest, HX of aortic dissection repair    [id number] returns for 6 month follow-up after urgent repair of a ascending aortic type A dissection with a 28 mm heme shield straight graft. The patient required postoperative intra-aortic balloon pump support for transient LV dysfunction. An echocardiogram 2 months following urgent surgery demonstrates ejection fraction of 45% with trivial AI.  The patient had a preoperative posterior circulation CVA with visual cut-left homonymous hemianopsia  Patient had an old type B dissection of the descending thoracic aorta prior to his type A dissection.   The patient does not have any chest pain or symptoms of heart failure. His blood pressure is well-controlled on oral meds His main complaint is right hip pain from degenerative arthritis and he hopes to be evaluated for hip replacement in the near future. The patient is regularly followed by his cardiologist Dr. Glenetta Hew.  CTA of his thoracic aorta today shows an intact repair of the descending aortic tear. He has a persistent false lumen of the distal arch at the level of the left subclavian which measures 4 cm in total diameter and a second false lumen that arises in the distal descending thoracic aorta extending down to his iliacs. This is all stable.   Past Medical History:  Diagnosis Date  . Anxiety   . Aortic dissection, thoracic ([place]) 10/28/2015  . Chronic combined systolic and diastolic CHF, NYHA class 2 (HCC)    as a result of Aortic Dissection  . COPD (chronic obstructive pulmonary disease) ([place])   . CVA (cerebral infarction) 10/2015  . GERD (gastroesophageal reflux disease)   . GERD (gastroesophageal reflux disease)   . Hyperlipidemia   . Ischemic cardiomyopathy 10/2015   EF now 40-45% by Echo (~29% by Myoview)   . Malignant hypertension  Dx 2016  . Myocardial infarction, inferior wall ([place]) 10/2015   Peri-Operative Inferior STEMI, presumably related to air embolism. -- Inferior Akinesis on Echo & Large Inferior-Inferolateral Infarct on Myoview  . Seasonal allergies   . Stroke with cerebral ischemia ([place]) 10/25/15   as a result of Aortic Dissection    Past Surgical History:  Procedure Laterality Date  . NM MYOVIEW LTD  01/08/2016   (~EF 29 %), LARGE FIXED INFERIO-INFEROLATERAL WALL c/s prior Inferior MI. - Read as HIGH RISK due to LOW EF & Large Infarct  . Rectal abscess    . TEE WITHOUT CARDIOVERSION N/A 10/28/2015   Procedure: TRANSESOPHAGEAL ECHOCARDIOGRAM (TEE);  Surgeon: Ivin Poot, MD;  Location: [place];  Service: Open Heart Surgery;  Laterality: N/A;  . THORACIC AORTIC ANEURYSM REPAIR N/A 10/28/2015   Procedure: TYPE A AORTIC DISSECTION REPAIR WITH CIRC ARREST;  Surgeon: Ivin Poot, MD;  Location: [place];  Service: Open Heart Surgery;  Laterality: N/A;  . TRANSTHORACIC ECHOCARDIOGRAM  10/26/15   EF 60-65%. GR 1 DD. Aortic root 40 mm, ascending aorta diameter 15 mm. Trivial AI  . TRANSTHORACIC ECHOCARDIOGRAM  01/09/2016   ef 40-45%, Mod Conc LVH. diffuse HK with AKinesis of basal-mid inferior wall. Gr 1 DD.  - c/w prior Inferior MI    Family History  Problem Relation Age of Onset  . Diabetes Father   . Heart disease Father   . Dementia Father   . Colon cancer Neg Hx     Social History Social History  Substance Use Topics  . Smoking status: Former Smoker    Packs/day: 0.50    Years: 39.00    Types: Cigarettes    Quit date: 10/25/2015  . Smokeless tobacco: Never Used  . Alcohol use 1.2 - 1.8 oz/week    2 - 3 Glasses of wine per week     Comment: OCC    Current Outpatient Prescriptions  Medication Sig Dispense Refill  . acetaminophen-codeine (TYLENOL #3) 300-30 MG tablet Take 1 tablet by mouth every 8 (eight) hours as needed for moderate pain. 90 tablet 0  . aspirin 81 MG EC tablet Take 1 tablet (81  mg total) by mouth daily. 90 tablet 2  . atorvastatin (LIPITOR) 40 MG tablet Take 1 tablet (40 mg total) by mouth daily at 6 PM. 90 tablet 3  . budesonide-formoterol (SYMBICORT) 160-4.5 MCG/ACT inhaler Take 2 puffs first thing in am and then another 2 puffs about 12 hours later. 1 Inhaler 11  . carvedilol (COREG) 6.25 MG tablet Take 1.5 tablets (9.375 mg total) by mouth 2 (two) times daily with a meal. 270 tablet 3  . cetirizine (ZYRTEC) 10 MG tablet Take 1 tablet (10 mg total) by mouth daily. 30 tablet 11  . cloNIDine (CATAPRES) 0.2 MG tablet Take 1 tablet (0.2 mg total) by mouth 2 (two) times daily. 180 tablet 3  . furosemide (LASIX) 40 MG tablet Take 40 mg in the morning with an additional 40 mg in the afternoon if weight gain is greater than 3 lbs from previous day 180 tablet 3  . losartan (COZAAR) 50 MG tablet Take 1 tablet (50 mg total) by mouth daily. 90 tablet 3  . Na Sulfate-K Sulfate-Mg Sulf (SUPREP BOWEL PREP KIT) 17.5-3.13-1.6 GM/180ML SOLN Take 1 kit by mouth once. Name brand only, suprep as directed, no substitutions 354 mL 0  . nitroGLYCERIN (NITROSTAT) 0.4 MG SL tablet Place 1 tablet (0.4 mg total) under the tongue every 5 (five) minutes x 3 doses as needed for chest pain. 25 tablet 2  . polyethylene glycol (MIRALAX / GLYCOLAX) packet Take 17 g by mouth daily as needed. 30 each 5  . potassium chloride SA (K-DUR,KLOR-CON) 20 MEQ tablet 20 mEq by mouth daily with lasix, take twice daily on the days you take an extra dose of lasix 180 tablet 3   No current facility-administered medications for this visit.     No Known Allergies  Review of Systems         Review of Systems :  [ y ] = yes, [  ] = no        General :  Weight gain [ yes inability to walk  ]    Weight loss  [   ]  Fatigue [  ]  Fever [  ]  Chills  [  ]                                Weakness  [  ]           HEENT    Headache [  ]  Dizziness [  ]  Blurred vision [ visual cut from preoperative stroke persists ]  Glaucoma  [  ]                          Nosebleeds [  ] Painful or loose teeth [  ]  Cardiac :  Chest pain/ pressure [  ]  Resting SOB [  ] exertional SOB [  ]                        Orthopnea [  ]  Pedal edema  [  ]  Palpitations [  ] Syncope/presyncope _0                         Paroxysmal nocturnal dyspnea [  ]         Pulmonary : cough [  ]  wheezing [  ]  Hemoptysis [  ] Sputum [  ] Snoring [  ]                              Pneumothorax [  ]  Sleep apnea [  ]patient has stop smoking. PFTs are satisfactory.        GI : Vomiting [  ]  Dysphagia [  ]  Melena  [  ]  Abdominal pain [  ] BRBPR [  ]              Heart burn [  ]  Constipation [  ] Diarrhea  [  ] Colonoscopy [ patient is pending colonoscopy  ]        GU : Hematuria [  ]  Dysuria [  ]  Nocturia [  ] UTI's [  ]        Vascular : Claudication [  ]  Rest pain [  ]  DVT [  ] Vein stripping [  ] leg ulcers [  ]                          TIA [  ] Stroke [  ]  Varicose veins [  ]        NEURO :  Headaches  [  ] Seizures [  ] Vision changes [yes stroke 6 months ago  ] Paresthesias [  ]                                       Seizures [  ]        Musculoskeletal :  Arthritis [  yes right hip severe pain limiting ambulation] Gout  [  ]  Back pain [  ]  Joint pain [  ]        Skin :  Rash [  ]  Melanoma [  ] Sores [  ]        Heme : Bleeding problems [  ]Clotting Disorders [  ] Anemia [  ]Blood Transfusion _1         Endocrine : Diabetes [  ] Heat or Cold intolerance [  ] Polyuria [  ]excessive thirst _2         Psych : Depression [  ]  Anxiety [  ]  Psych hospitalizations [  ] Memory change [  ]                                               BP  111/71 (BP Location: Right Arm, Patient Position: Sitting, Cuff Size: Large)   Pulse 73   Resp 20   Ht _0  (1.727 m)   Wt 257 lb (116.6 kg)   SpO2 99% Comment: RA  BMI 39.08 kg/m  Physical Exam        Physical Exam  General: Obese middle-aged AA male who walks with a  pronounced limp from his painful hip HEENT: Normocephalic pupils equal , dentition adequate Neck: Supple without JVD, adenopathy, or bruit Chest: Clear to auscultation, symmetrical breath sounds, no rhonchi, no tenderness             or deformity Cardiovascular: Regular rate and rhythm, no murmur, no gallop, peripheral pulses             palpable in all extremities Abdomen:  Soft, nontender, no palpable mass or organomegaly Extremities: Warm, well-perfused, no clubbing cyanosis edema or tenderness,              no venous stasis changes of the legs Rectal/GU: Deferred Neuro: Grossly non--focal and symmetrical throughout Skin: Clean and dry without rash or ulceration    Diagnostic Tests: CTA performed today personally reviewed and images counseled with patient. The straight graft repair of his ascending aorta to the arch is intact. The arch vessels are well-perfused. The distal arch at the level of left subclavian has a false lumen which remains approximate 4.1 cm in diameter. A tear in the distal  descending thoracic aorta, type B dissection, is stable without aortic enlargement and extends to the right iliac  Impression: #1 hypertension well controlled on current medications #2 successful surgical repair of ascending aortic type A dissection without recurrent false aneurysm LV function has recovered since the last study 5 months ago.   Len Childs, MD [facility] [phone] #3 stable limited dissection-false lumen of the distal arch measuring 4.1 cm in total diameter #4 stable distal descending thoracic aortic type B dissection without dilatation extending to the right iliac #5 ejection fraction 45% by last echo 4-5 months ago.  Plan: Told the patient I  was pleased with his blood pressure control and the appearance of his CTA. We do not need to continue frequent CT scans of his chest but an annual scan  to follow the aortic size. I told the  patient he would not benefit from long-term narcotics for his pain and gave her a prescription for voltaren cream to place over the right hip area. The patient is in process of being evaluated for right hip surgery. I feel that he is stable for surgery with respect to his thoracic aortic disease. However I will have the patient be reevaluated with repeat echocardiogram to determine if LV function has improved. He has no evidence of aortic insufficiency on exam.  Plan on seeing the patient back with CT scan without contrast 1 year after his dissection repair.

## 2016-05-12 ENCOUNTER — Other Ambulatory Visit: Payer: Self-pay

## 2016-05-12 ENCOUNTER — Ambulatory Visit (HOSPITAL_COMMUNITY): Payer: Medicaid Other | Attending: Internal Medicine

## 2016-05-12 DIAGNOSIS — I255 Ischemic cardiomyopathy: Secondary | ICD-10-CM | POA: Diagnosis not present

## 2016-05-12 DIAGNOSIS — R29898 Other symptoms and signs involving the musculoskeletal system: Secondary | ICD-10-CM | POA: Diagnosis not present

## 2016-05-12 DIAGNOSIS — Z6839 Body mass index (BMI) 39.0-39.9, adult: Secondary | ICD-10-CM | POA: Diagnosis not present

## 2016-05-12 DIAGNOSIS — I71019 Dissection of thoracic aorta, unspecified: Secondary | ICD-10-CM

## 2016-05-12 DIAGNOSIS — I7101 Dissection of thoracic aorta: Secondary | ICD-10-CM | POA: Diagnosis not present

## 2016-05-12 DIAGNOSIS — I351 Nonrheumatic aortic (valve) insufficiency: Secondary | ICD-10-CM | POA: Diagnosis not present

## 2016-05-12 DIAGNOSIS — I4891 Unspecified atrial fibrillation: Secondary | ICD-10-CM | POA: Insufficient documentation

## 2016-05-12 DIAGNOSIS — Z87891 Personal history of nicotine dependence: Secondary | ICD-10-CM | POA: Insufficient documentation

## 2016-05-12 DIAGNOSIS — I358 Other nonrheumatic aortic valve disorders: Secondary | ICD-10-CM | POA: Diagnosis not present

## 2016-05-12 DIAGNOSIS — J449 Chronic obstructive pulmonary disease, unspecified: Secondary | ICD-10-CM | POA: Diagnosis not present

## 2016-05-12 DIAGNOSIS — I10 Essential (primary) hypertension: Secondary | ICD-10-CM | POA: Diagnosis not present

## 2016-06-26 ENCOUNTER — Ambulatory Visit: Payer: Self-pay | Admitting: Gastroenterology

## 2016-08-02 ENCOUNTER — Emergency Department (HOSPITAL_COMMUNITY)
Admission: EM | Admit: 2016-08-02 | Discharge: 2016-08-11 | Disposition: E | Payer: Medicaid Other | Attending: Emergency Medicine | Admitting: Emergency Medicine

## 2016-08-02 ENCOUNTER — Encounter (HOSPITAL_COMMUNITY): Payer: Self-pay | Admitting: *Deleted

## 2016-08-02 DIAGNOSIS — I469 Cardiac arrest, cause unspecified: Secondary | ICD-10-CM | POA: Diagnosis not present

## 2016-08-02 DIAGNOSIS — I10 Essential (primary) hypertension: Secondary | ICD-10-CM | POA: Insufficient documentation

## 2016-08-02 DIAGNOSIS — I252 Old myocardial infarction: Secondary | ICD-10-CM | POA: Diagnosis not present

## 2016-08-02 DIAGNOSIS — Z7982 Long term (current) use of aspirin: Secondary | ICD-10-CM | POA: Diagnosis not present

## 2016-08-02 DIAGNOSIS — Z8673 Personal history of transient ischemic attack (TIA), and cerebral infarction without residual deficits: Secondary | ICD-10-CM | POA: Diagnosis not present

## 2016-08-02 DIAGNOSIS — I2510 Atherosclerotic heart disease of native coronary artery without angina pectoris: Secondary | ICD-10-CM | POA: Insufficient documentation

## 2016-08-02 DIAGNOSIS — J449 Chronic obstructive pulmonary disease, unspecified: Secondary | ICD-10-CM | POA: Insufficient documentation

## 2016-08-02 DIAGNOSIS — Z87891 Personal history of nicotine dependence: Secondary | ICD-10-CM | POA: Insufficient documentation

## 2016-08-02 LAB — I-STAT CHEM 8, ED
BUN: 30 mg/dL — ABNORMAL HIGH (ref 6–20)
CHLORIDE: 109 mmol/L (ref 101–111)
Calcium, Ion: 1.1 mmol/L — ABNORMAL LOW (ref 1.15–1.40)
Creatinine, Ser: 2.1 mg/dL — ABNORMAL HIGH (ref 0.61–1.24)
Glucose, Bld: 225 mg/dL — ABNORMAL HIGH (ref 65–99)
HEMATOCRIT: 47 % (ref 39.0–52.0)
HEMOGLOBIN: 16 g/dL (ref 13.0–17.0)
POTASSIUM: 5.4 mmol/L — AB (ref 3.5–5.1)
Sodium: 139 mmol/L (ref 135–145)
TCO2: 19 mmol/L (ref 0–100)

## 2016-08-02 LAB — I-STAT CG4 LACTIC ACID, ED: LACTIC ACID, VENOUS: 8.96 mmol/L — AB (ref 0.5–1.9)

## 2016-08-02 LAB — I-STAT TROPONIN, ED: TROPONIN I, POC: 0.1 ng/mL — AB (ref 0.00–0.08)

## 2016-08-02 MED ORDER — EPINEPHRINE PF 1 MG/10ML IJ SOSY
PREFILLED_SYRINGE | INTRAMUSCULAR | Status: AC | PRN
  Administered 2016-08-02 (×3): 1 mg via INTRAVENOUS

## 2016-08-02 MED ORDER — CALCIUM CHLORIDE 10 % IV SOLN
INTRAVENOUS | Status: AC | PRN
  Administered 2016-08-02: 1 g via INTRAVENOUS

## 2016-08-03 MED FILL — Medication: Qty: 1 | Status: AC

## 2016-08-04 ENCOUNTER — Other Ambulatory Visit: Payer: Self-pay | Admitting: Family Medicine

## 2016-08-04 ENCOUNTER — Other Ambulatory Visit: Payer: Self-pay | Admitting: Cardiothoracic Surgery

## 2016-08-04 DIAGNOSIS — I10 Essential (primary) hypertension: Secondary | ICD-10-CM

## 2016-08-11 NOTE — ED Provider Notes (Signed)
[facility] DEPT Provider Note   CSN: [id number] Arrival date & time: 08-06-16  1528     History   Chief Complaint Chief Complaint  Patient presents with  . Cardiac Arrest    HPI [name] is a 55 y.o. male.  The history is provided by the EMS personnel.  Cardiac Arrest  Witnessed by:  Family member Incident location: out in community. Time since incident:  45 minutes Time before BLS initiated:  > 5 minutes Time before ALS initiated:  > 10 minutes Condition upon EMS arrival:  Unresponsive Pulse:  Absent Initial cardiac rhythm per EMS:  PEA Treatments prior to arrival:  ACLS protocol Medications given prior to ED:  Epinephrine Airway:  Oropharyngeal Rhythm on admission to ED:  PEA Associated symptoms: fatigue and weakness     Past Medical History:  Diagnosis Date  . Anxiety   . Aortic dissection, thoracic ([place]) 10/28/2015  . Chronic combined systolic and diastolic CHF, NYHA class 2 (HCC)    as a result of Aortic Dissection  . COPD (chronic obstructive pulmonary disease) ([place])   . CVA (cerebral infarction) 10/2015  . GERD (gastroesophageal reflux disease)   . GERD (gastroesophageal reflux disease)   . Hyperlipidemia   . Ischemic cardiomyopathy 10/2015   EF now 40-45% by Echo (~29% by Myoview)   . Malignant hypertension Dx 2016  . Myocardial infarction, inferior wall ([place]) 10/2015   Peri-Operative Inferior STEMI, presumably related to air embolism. -- Inferior Akinesis on Echo & Large Inferior-Inferolateral Infarct on Myoview  . Seasonal allergies   . Stroke with cerebral ischemia ([place]) 10/25/15   as a result of Aortic Dissection    Patient Active Problem List   Diagnosis Date Noted  . Preop cardiovascular exam 02/22/2016  . COPD GOLD 0  02/09/2016  . Morbid obesity due to excess calories ([place]) 02/09/2016  . SOB (shortness of breath) 01/20/2016  . Cardiomyopathy, ischemic-40-45% 01/10/2016  . Uncontrolled hypertension 01/10/2016  . PAF (paroxysmal  atrial fibrillation) ([place]) 01/10/2016  . Pain in the chest   . Elevated brain natriuretic peptide (BNP) level 01/07/2016  . Dyspnea 01/07/2016  . Orthopnea 01/06/2016  . Seasonal allergies 01/06/2016  . Insomnia 01/06/2016  . Constipation due to pain medication 12/21/2015  . Cutaneous skin tags 12/21/2015  . Generalized anxiety disorder 11/25/2015  . History of CVA -embolic Feb 4680 32/08/2481  . Left homonymous hemianopsia   . Aortic dissection, thoracic- surgery Feb 2017 10/28/2015  . Cerebral thrombosis with cerebral infarction 10/26/2015  . Osteoarthritis of right hip 03/01/2015  . Known aortic dissection, thoracoabdominal April 2016 03/01/2015  . Vitamin D deficiency 01/28/2015  . Nonspecific abnormal electrocardiogram (ECG) (EKG) 01/26/2015  . Benign paroxysmal positional vertigo 01/12/2015  . Alcohol abuse 01/07/2015  . Cocaine abuse-Feb 2017 01/07/2015  . Essential hypertension     Past Surgical History:  Procedure Laterality Date  . NM MYOVIEW LTD  01/08/2016   (~EF 29 %), LARGE FIXED INFERIO-INFEROLATERAL WALL c/s prior Inferior MI. - Read as HIGH RISK due to LOW EF & Large Infarct  . Rectal abscess    . TEE WITHOUT CARDIOVERSION N/A 10/28/2015   Procedure: TRANSESOPHAGEAL ECHOCARDIOGRAM (TEE);  Surgeon: Ivin Poot, MD;  Location: [facility];  Service: Open Heart Surgery;  Laterality: N/A;  . THORACIC AORTIC ANEURYSM REPAIR N/A 10/28/2015   Procedure: TYPE A AORTIC DISSECTION REPAIR WITH CIRC ARREST;  Surgeon: Ivin Poot, MD;  Location: [facility];  Service: Open Heart Surgery;  Laterality: N/A;  .  TRANSTHORACIC ECHOCARDIOGRAM  10/26/15   EF 60-65%. GR 1 DD. Aortic root 40 mm, ascending aorta diameter 15 mm. Trivial AI  . TRANSTHORACIC ECHOCARDIOGRAM  01/09/2016   ef 40-45%, Mod Conc LVH. diffuse HK with AKinesis of basal-mid inferior wall. Gr 1 DD.  - c/w prior Inferior MI       Home Medications    Prior to Admission medications   Medication Sig Start Date End Date  Taking? Authorizing Provider  acetaminophen-codeine (TYLENOL #3) 300-30 MG tablet Take 1 tablet by mouth every 8 (eight) hours as needed for moderate pain. 03/17/16   Boykin Nearing, MD  aspirin 81 MG EC tablet Take 1 tablet (81 mg total) by mouth daily. 03/17/16   Josalyn Funches, MD  atorvastatin (LIPITOR) 40 MG tablet Take 1 tablet (40 mg total) by mouth daily at 6 PM. 02/22/16   Leonie Man, MD  budesonide-formoterol [facility]) 160-4.5 MCG/ACT inhaler Take 2 puffs first thing in am and then another 2 puffs about 12 hours later. 03/09/16   Tanda Rockers, MD  carvedilol (COREG) 6.25 MG tablet Take 1.5 tablets (9.375 mg total) by mouth 2 (two) times daily with a meal. 02/22/16   Leonie Man, MD  cetirizine (ZYRTEC) 10 MG tablet Take 1 tablet (10 mg total) by mouth daily. 01/06/16   Josalyn Funches, MD  cloNIDine (CATAPRES) 0.2 MG tablet Take 1 tablet (0.2 mg total) by mouth 2 (two) times daily. 02/22/16   Leonie Man, MD  furosemide (LASIX) 40 MG tablet Take 40 mg in the morning with an additional 40 mg in the afternoon if weight gain is greater than 3 lbs from previous day 02/22/16   Leonie Man, MD  losartan (COZAAR) 50 MG tablet Take 1 tablet (50 mg total) by mouth daily. 02/22/16   Leonie Man, MD  Na Sulfate-K Sulfate-Mg Sulf (SUPREP BOWEL PREP KIT) 17.5-3.13-1.6 GM/180ML SOLN Take 1 kit by mouth once. Name brand only, suprep as directed, no substitutions 02/14/16   Milus Banister, MD  nitroGLYCERIN (NITROSTAT) 0.4 MG SL tablet Place 1 tablet (0.4 mg total) under the tongue every 5 (five) minutes x 3 doses as needed for chest pain. 01/10/16   Erlene Quan, PA-C  polyethylene glycol (MIRALAX / GLYCOLAX) packet Take 17 g by mouth daily as needed. 12/21/15   Boykin Nearing, MD  potassium chloride SA (K-DUR,KLOR-CON) 20 MEQ tablet 20 mEq by mouth daily with lasix, take twice daily on the days you take an extra dose of lasix 02/22/16   Leonie Man, MD    Family History Family History    Problem Relation Age of Onset  . Diabetes Father   . Heart disease Father   . Dementia Father   . Colon cancer Neg Hx     Social History Social History  Substance Use Topics  . Smoking status: Former Smoker    Packs/day: 0.50    Years: 39.00    Types: Cigarettes    Quit date: 10/25/2015  . Smokeless tobacco: Never Used  . Alcohol use 1.2 - 1.8 oz/week    2 - 3 Glasses of wine per week     Comment: [place]     Allergies   Patient has no known allergies.   Review of Systems Review of Systems  Unable to perform ROS: Acuity of condition  Constitutional: Positive for fatigue.  Neurological: Positive for weakness.     Physical Exam Updated Vital Signs BP (!) 0/0   Pulse Marland Kitchen)  0   Resp (!) 0   Wt 116.6 kg   SpO2 (!) 0%   BMI 39.08 kg/m   Physical Exam  Constitutional: [name] appears well-nourished. Backboard in place.  HENT:  Head: Normocephalic and atraumatic.  Eyes: Lids are normal.  Pupils 4+ and not reactive to light.  Neck: Trachea normal. Decreased carotid pulses (none) present.  Cardiovascular:  Pulses:      Carotid pulses are 0 on the right side.      Radial pulses are 0 on the right side.       Femoral pulses are 0 on the right side. No central pulses.  Pulmonary/Chest:  Bilateral breath sounds only when bagging.  Abdominal: Soft.  Musculoskeletal:  No obvious signs of trauma  Neurological: [name] is unresponsive. GCS eye subscore is 1. GCS verbal subscore is 1. GCS motor subscore is 1.  Vitals reviewed.    ED Treatments / Results  Labs (all labs ordered are listed, but only abnormal results are displayed) Labs Reviewed  I-STAT CHEM 8, ED - Abnormal; Notable for the following:       Result Value   Potassium 5.4 (*)    BUN 30 (*)    Creatinine, Ser 2.10 (*)    Glucose, Bld 225 (*)    Calcium, Ion 1.10 (*)    All other components within normal limits  I-STAT TROPOININ, ED - Abnormal; Notable for the following:    Troponin i, poc 0.10 (*)    All  other components within normal limits  I-STAT CG4 LACTIC ACID, ED - Abnormal; Notable for the following:    Lactic Acid, Venous 8.96 (*)    All other components within normal limits    EKG  EKG Interpretation None       Radiology No results found.  Procedures Procedures (including critical care time)  Medications Ordered in ED Medications  EPINEPHrine (ADRENALIN) 1 MG/10ML injection (1 mg Intravenous Given 2016-08-26 1536)  calcium chloride injection (1 g Intravenous Given 08-26-16 1537)     Initial Impression / Assessment and Plan / ED Course  I have reviewed the triage vital signs and the nursing notes.  Pertinent labs & imaging results that were available during my care of the patient were reviewed by me and considered in my medical decision making (see chart for details).  Clinical Course     55 year old male with past medical history of thoracic aortic aneurysm, stroke, heart failure, coronary artery disease. [name] comes to [place] via EMS with active CPR going on. Story as [name] was witnessed to have fallen, unresponsive. For several minutes no CPR was initiated. EMS arrived and initiated CPR. En route the intubated but lost airway and then placed a cane. His capnography has been in the 30s. [name] was a PEA. 5 rounds of epinephrine were given en route no return of spontaneous circulation. CPR is continued here for multiple episodes of epinephrine and pulse checks all showing PEA. Ultrasound was placed on the hard there is disorganized week cardiac activity. Patient has no pupillary reaction to light. Capnography is decreasing of the 20s. Overt our CPR and pulselessness have gone on. Neurologic outcomes are bad at this point. CPR was stopped at 1539.  Final Clinical Impressions(s) / ED Diagnoses   Final diagnoses:  Cardiac arrest [facility])    New Prescriptions New Prescriptions   No medications on file     Dewaine Conger, MD 26-Aug-2016 1621    Gareth Morgan, MD 08/03/16 [place]  Billy Fischer, MD 08/03/16 1626

## 2016-08-11 NOTE — Progress Notes (Signed)
   2016/04/19 1500  Clinical Encounter Type  Visited With Patient not available;Health care provider  Visit Type Code  Referral From Nurse  Consult/Referral To Chaplain  Spiritual Encounters  Spiritual Needs Other (Comment)  Stress Factors  Patient Stress Factors Not reviewed    Chaplain responded to pt brought in post cpr. No family present. Spoke with ems and medical staff.

## 2016-08-11 NOTE — ED Triage Notes (Signed)
Arrived by [facility]. Pt went unresponsive per family, was apneic and pulseless on ems arrival. Cpr started at 1439. Given 5 epi and 4mg  narcan pta, given 500ml NS bolus. King airway placed pta.

## 2016-08-11 NOTE — ED Notes (Signed)
Pt transported to morgue 

## 2016-08-11 NOTE — Progress Notes (Signed)
   05/07/2016 1600  Clinical Encounter Type  Visited With Family;Health care provider  Visit Type Follow-up;Psychological support;Spiritual support  Referral From Nurse;Physician  Consult/Referral To Chaplain  Spiritual Encounters  Spiritual Needs Prayer;Emotional;Grief support  Stress Factors  Patient Stress Factors None identified;Not reviewed  Family Stress Factors Family relationships;Loss    Chaplain followed up with family. Co facilitated visit with dr. Rock NephewPt had died team unable to resuscitate. Mother and extended family in consult room. Three separate visits. One visit got contact information for next of kin. Prayed with family and offered emotional support. Offered hospitality and called extended family.

## 2016-08-11 DEATH — deceased

## 2016-12-22 IMAGING — CT CT ANGIO CHEST-ABD-PELV FOR DISSECTION W/ AND WO/W CM
2 of 9 series · 14 of 46 positions shown, 16 images · IV contrast (OMNI)
Comparison: 10/25/2015

CLINICAL DATA: Chest pain and shortness of Breath, history of
aortic dissection repair.

EXAM:
CT ANGIOGRAPHY CHEST, ABDOMEN AND PELVIS
TECHNIQUE: Multidetector CT imaging through the chest, abdomen and pelvis was
performed using the standard protocol during bolus administration of
intravenous contrast. Multiplanar reconstructed images and MIPs were
obtained and reviewed to evaluate the vascular anatomy.
CONTRAST:  100 mL Isovue 370.

[Series 5: dissection 3.0 i30f 3 · axial · 0.77mm/px · z∈[+926,+1406]mm · 11 of 182 slices shown, 13 images]
[im 11/182  soft-tissue]
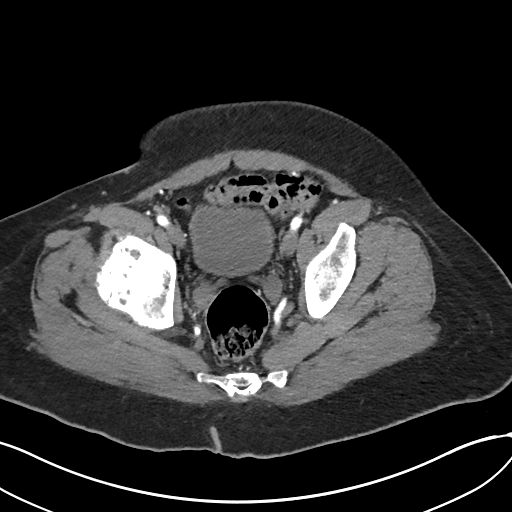
[im 11/182  bone]
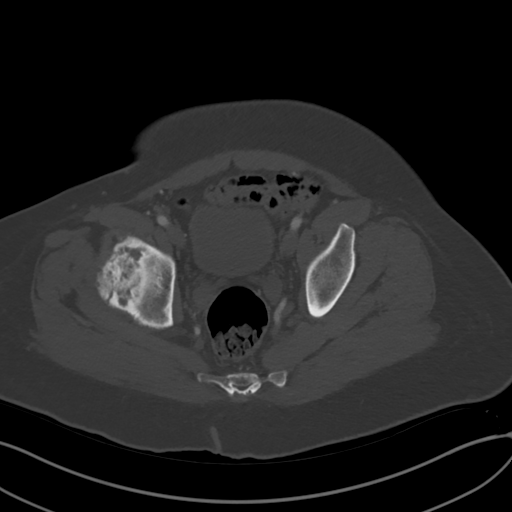
[im 32/182  soft-tissue]
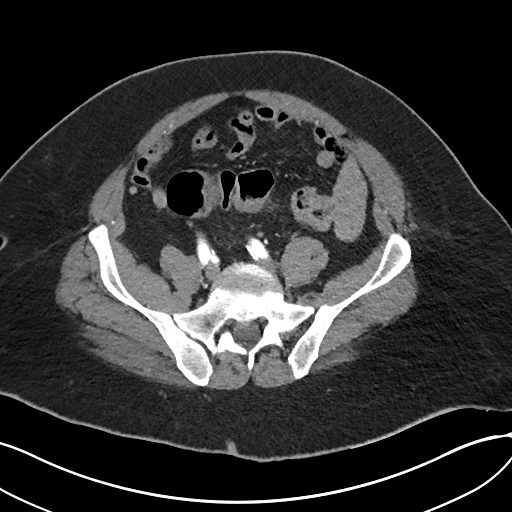
[im 43/182  soft-tissue]
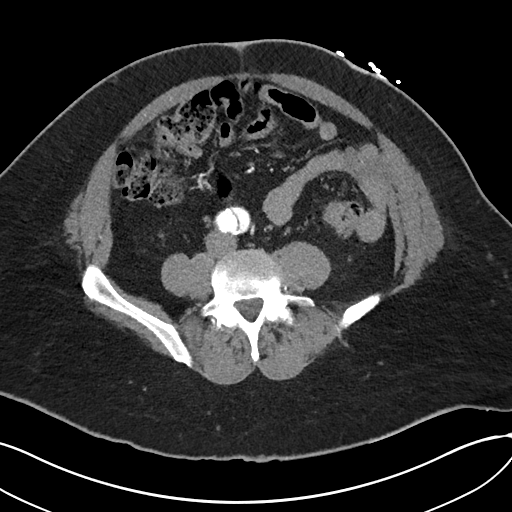
[im 64/182  soft-tissue]
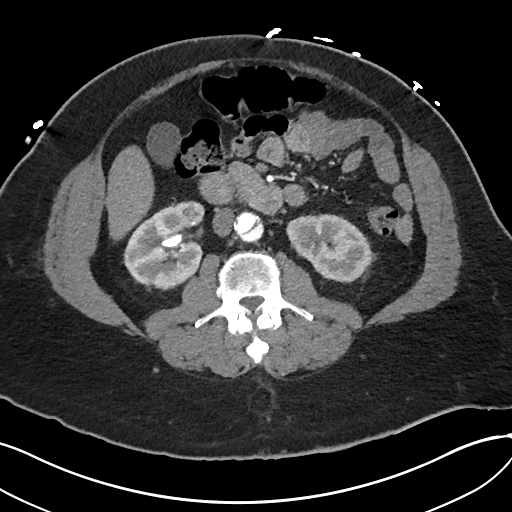
[im 75/182  soft-tissue]
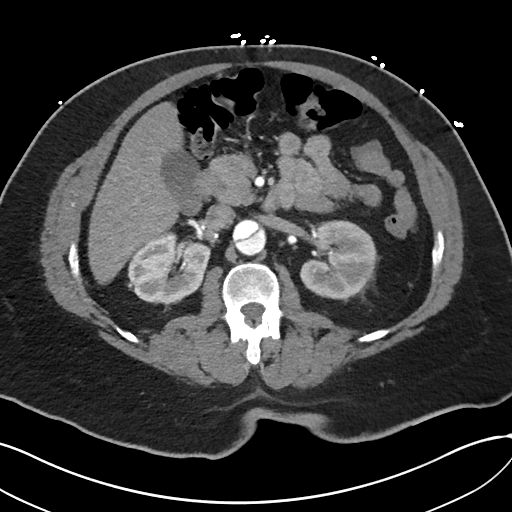
[im 96/182  soft-tissue]
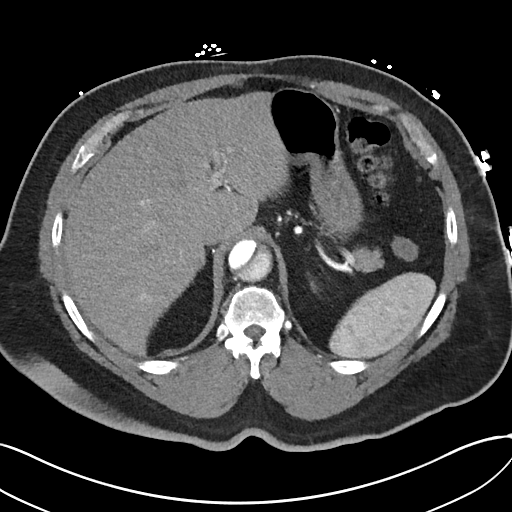
[im 107/182  soft-tissue]
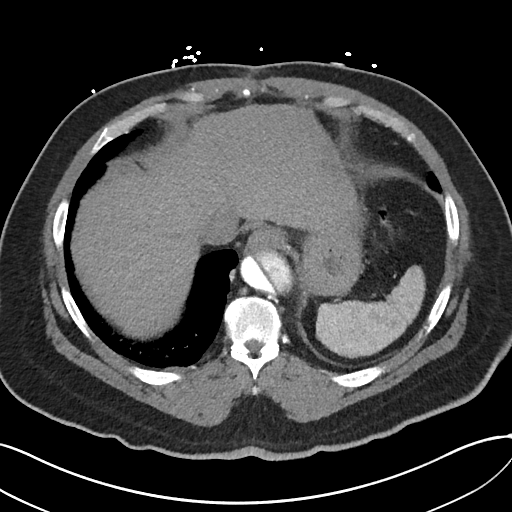
[im 118/182  soft-tissue]
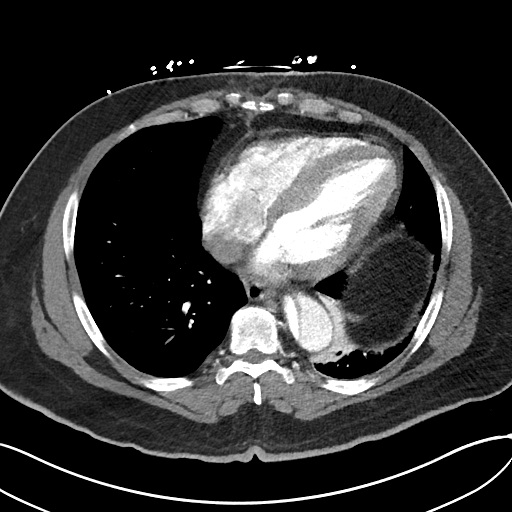
[im 139/182  soft-tissue]
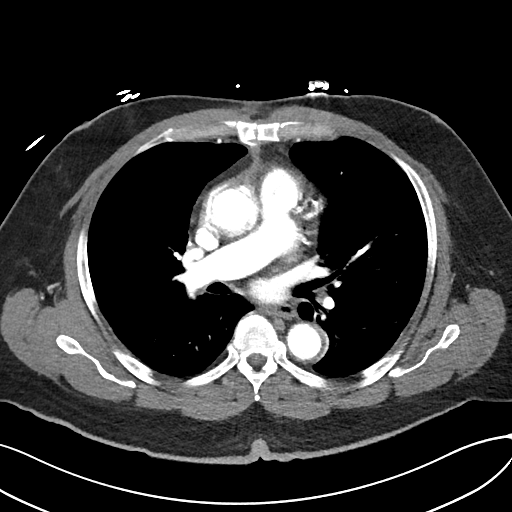
[im 139/182  bone]
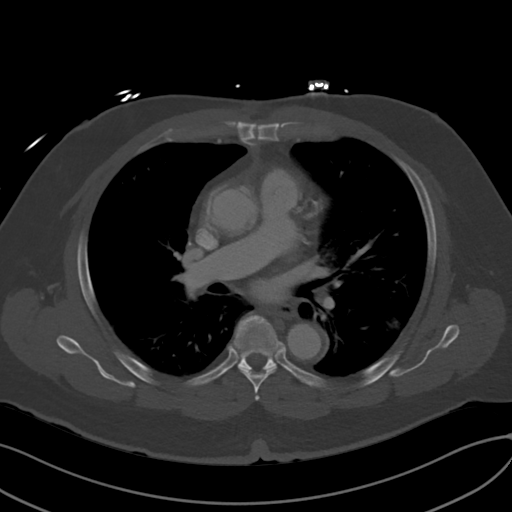
[im 150/182  soft-tissue]
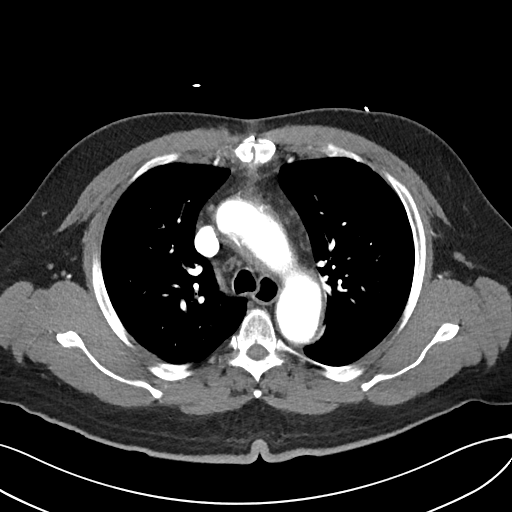
[im 171/182  soft-tissue]
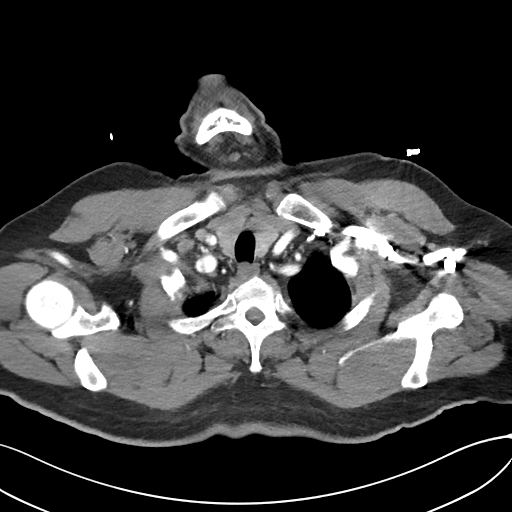

[Series 8: coronals · coronal · 0.85mm/px · 3 of 151 slices shown]
[im 38/151  soft-tissue]
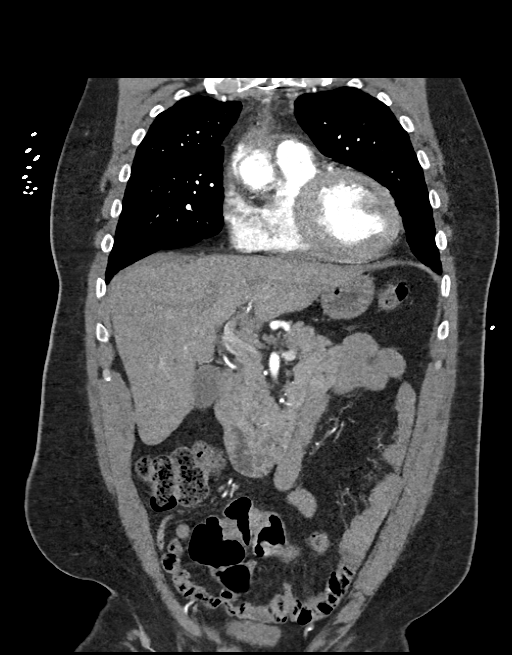
[im 76/151  soft-tissue]
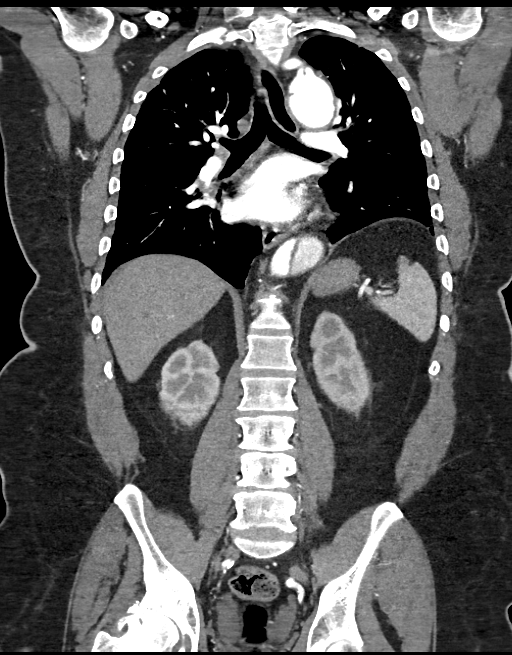
[im 113/151  soft-tissue]
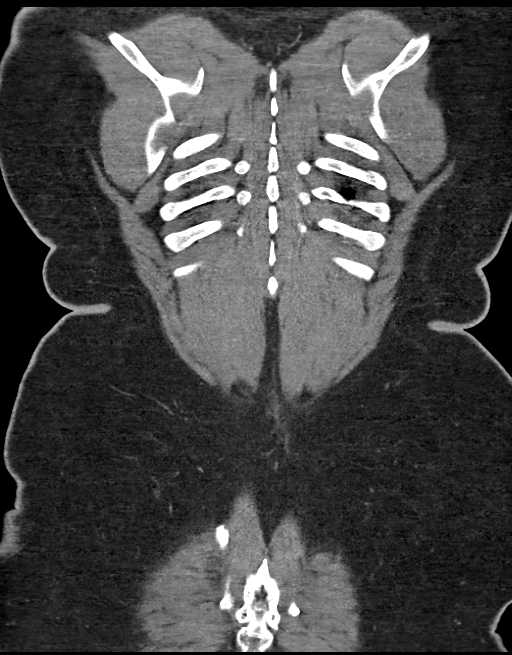

[14 of 46 positions shown; findings below may reference images not displayed]

FINDINGS: CTA CHEST FINDINGS

The lungs are well aerated bilaterally. Some mild periaortic
atelectasis is noted in the left lower lobe. This is stable from the
prior exam. No new focal infiltrate or effusion is seen.

The thoracic inlet shows evidence of dissection flaps in the
proximal portions of the left subclavian artery as well as within
the proximal portion of the right subclavian and right common
carotid artery. The 5 seen previously in the origin of the right
innominate artery is not as well appreciated following the interval
surgery. There are changes consistent with ascending aortic repair
without recurrent dissection.

There remains an intimal flap in the distal aspect of the thoracic
aortic arch with origin of the left vertebral and left subclavian
artery from the residual false lumen. This false lumen extends only
for few cm proximally. Second false lumen is noted in the descending
thoracic aorta. This extends to the level of the aortic hiatus and
into the abdominal aorta similar to that seen on the prior exam.

No hilar or mediastinal adenopathy is noted. Changes of prior median
sternotomy are noted. No acute bony abnormality is seen. Pulmonary
artery is within normal limits.

Review of the MIP images confirms the above findings.

CTA ABDOMEN AND PELVIS FINDINGS

The dissection flap extends inferiorly in the abdominal aorta and
subsequently into the left common iliac artery. It does not extend
inferiorly into the internal or external iliac arteries. There is a
focal defect in the flap just above the origin of the left renal
artery best seen on image number 110 of series 5. This allows
perfusion of the false lumen. The celiac axis, superior mesenteric
artery and dual right renal arteries arise from the true lumen. The
left renal artery and inferior mesenteric artery arise from the
false lumen. Adequate perfusion of the iliac arteries is noted
bilaterally.

The liver, spleen, adrenal glands, pancreas and gallbladder are
normal in their CT appearance. Kidneys demonstrate a normal
enhancement pattern bilaterally. Staghorn calculus is noted in the
lower pole of the right kidney measuring approximately 2.5 cm in
greatest dimension. This was not as well appreciated on the prior
exam due to the timing of the contrast bolus.

The appendix is within normal limits. No significant diverticulitis
is seen. The bladder is partially distended. No pelvic mass lesion
is noted. The osseous structures show degenerative change of the
lumbar spine and right hip joint.

Review of the MIP images confirms the above findings.
IMPRESSION: CTA of the chest: Status post repair of aortic dissection with
widely patent ascending aortic graft. Dissection flaps are again
identified within the proximal portions of the right common carotid
artery, right subclavian artery and proximal left subclavian artery.

Persistent small false lumen at the distal aspect of the aortic arch
with perfusion of the left vertebral artery and left subclavian
artery from this outpouching.

Second area of dissection in the descending thoracic aorta similar
to that seen on the prior exam. It perfuse is via an intimal flap
just above the left renal artery.

Stable left basilar atelectasis

CTA of the abdomen and pelvis: Continued dissection of the abdominal
aorta with extension into the left common iliac artery. The overall
appearance is similar to that seen on the prior CT examination. Per
fusion of the visceral vessels is as described above with only the
inferior mesenteric artery and left renal artery being perfuse by
the false lumen.

Right staghorn calculus.

No other focal abnormality is seen.
